# Patient Record
Sex: Female | Born: 1935 | Race: White | Hispanic: No | Smoking: Never smoker
Health system: Southern US, Community
[De-identification: ages and names within clinical notes are randomized; demographics above are authoritative.]

## PROBLEM LIST (undated history)

## (undated) DIAGNOSIS — E079 Disorder of thyroid, unspecified: Secondary | ICD-10-CM

## (undated) DIAGNOSIS — F0390 Unspecified dementia without behavioral disturbance: Secondary | ICD-10-CM

## (undated) DIAGNOSIS — I10 Essential (primary) hypertension: Secondary | ICD-10-CM

## (undated) DIAGNOSIS — H547 Unspecified visual loss: Secondary | ICD-10-CM

## (undated) DIAGNOSIS — H9190 Unspecified hearing loss, unspecified ear: Secondary | ICD-10-CM

## (undated) HISTORY — PX: ABDOMINAL HYSTERECTOMY: SHX81

## (undated) HISTORY — PX: KYPHOPLASTY: SHX5884

---

## 2007-09-21 ENCOUNTER — Ambulatory Visit (HOSPITAL_COMMUNITY): Admission: RE | Admit: 2007-09-21 | Discharge: 2007-09-21 | Payer: Self-pay | Admitting: General Surgery

## 2007-09-22 ENCOUNTER — Ambulatory Visit (HOSPITAL_COMMUNITY): Admission: RE | Admit: 2007-09-22 | Discharge: 2007-09-22 | Payer: Self-pay | Admitting: Interventional Radiology

## 2007-09-23 ENCOUNTER — Ambulatory Visit (HOSPITAL_COMMUNITY): Admission: RE | Admit: 2007-09-23 | Discharge: 2007-09-23 | Payer: Self-pay | Admitting: Interventional Radiology

## 2007-09-23 ENCOUNTER — Encounter (INDEPENDENT_AMBULATORY_CARE_PROVIDER_SITE_OTHER): Payer: Self-pay | Admitting: Interventional Radiology

## 2007-10-04 ENCOUNTER — Encounter: Payer: Self-pay | Admitting: Interventional Radiology

## 2010-01-17 ENCOUNTER — Ambulatory Visit: Payer: Self-pay | Admitting: Diagnostic Radiology

## 2010-01-17 ENCOUNTER — Inpatient Hospital Stay (HOSPITAL_COMMUNITY): Admission: EM | Admit: 2010-01-17 | Discharge: 2010-01-18 | Payer: Self-pay | Admitting: Internal Medicine

## 2010-01-17 ENCOUNTER — Encounter: Payer: Self-pay | Admitting: Emergency Medicine

## 2010-01-17 ENCOUNTER — Ambulatory Visit: Payer: Self-pay | Admitting: Internal Medicine

## 2010-01-18 ENCOUNTER — Encounter (INDEPENDENT_AMBULATORY_CARE_PROVIDER_SITE_OTHER): Payer: Self-pay | Admitting: Internal Medicine

## 2011-01-01 LAB — CARDIAC PANEL(CRET KIN+CKTOT+MB+TROPI)
CK, MB: 1.3 ng/mL (ref 0.3–4.0)
CK, MB: 1.5 ng/mL (ref 0.3–4.0)
CK, MB: 1.6 ng/mL (ref 0.3–4.0)
Relative Index: INVALID (ref 0.0–2.5)
Total CK: 44 U/L (ref 7–177)
Total CK: 51 U/L (ref 7–177)
Troponin I: <0.01 ng/mL (ref 0.00–0.06)

## 2011-01-01 LAB — CBC
HCT: 41.4 % (ref 36.0–46.0)
MCHC: 33.3 g/dL (ref 30.0–36.0)
MCV: 89.5 fL (ref 78.0–100.0)
Platelets: 226 10*3/uL (ref 150–400)
RDW: 14.6 % (ref 11.5–15.5)
WBC: 6.9 10*3/uL (ref 4.0–10.5)

## 2011-01-01 LAB — BASIC METABOLIC PANEL
CO2: 28 mEq/L (ref 19–32)
Calcium: 9.2 mg/dL (ref 8.4–10.5)
Chloride: 102 mEq/L (ref 96–112)
Creatinine, Ser: 0.67 mg/dL (ref 0.4–1.2)
GFR calc Af Amer: >60 mL/min (ref >60)
GFR calc non Af Amer: >60 mL/min (ref >60)
Glucose, Bld: 135 mg/dL — ABNORMAL HIGH (ref 70–99)
Potassium: 4.2 mEq/L (ref 3.5–5.1)
Sodium: 137 mEq/L (ref 135–145)

## 2011-01-01 LAB — COMPREHENSIVE METABOLIC PANEL
ALT: 6 U/L (ref 0–35)
Albumin: 4.2 g/dL (ref 3.5–5.2)
Alkaline Phosphatase: 81 U/L (ref 39–117)
BUN: 18 mg/dL (ref 6–23)
Calcium: 9.6 mg/dL (ref 8.4–10.5)
Chloride: 99 mEq/L (ref 96–112)
Creatinine, Ser: 0.6 mg/dL (ref 0.4–1.2)
GFR calc Af Amer: >60 mL/min (ref >60)
GFR calc non Af Amer: >60 mL/min (ref >60)
Glucose, Bld: 151 mg/dL — ABNORMAL HIGH (ref 70–99)

## 2011-01-01 LAB — DIFFERENTIAL
Basophils Relative: 1 % (ref 0–1)
Lymphs Abs: 0.6 10*3/uL — ABNORMAL LOW (ref 0.7–4.0)
Monocytes Absolute: 0.5 10*3/uL (ref 0.1–1.0)
Monocytes Relative: 7 % (ref 3–12)
Neutro Abs: 6 10*3/uL (ref 1.7–7.7)
Neutrophils Relative %: 84 % — ABNORMAL HIGH (ref 43–77)

## 2011-01-01 LAB — URINALYSIS, ROUTINE W REFLEX MICROSCOPIC
Glucose, UA: NEGATIVE mg/dL
Ketones, ur: NEGATIVE mg/dL
Specific Gravity, Urine: 1.013 (ref 1.005–1.030)
pH: 8 (ref 5.0–8.0)

## 2011-01-01 LAB — POCT CARDIAC MARKERS: Myoglobin, poc: 36.8 ng/mL (ref 12–200)

## 2011-01-01 LAB — LIPASE, BLOOD: Lipase: 128 U/L (ref 23–300)

## 2011-02-25 NOTE — Consult Note (Signed)
[name], [name]                  ACCOUNT NO.:  [id number]   MEDICAL RECORD NO.:  [id number]          PATIENT TYPE:  OUT   LOCATION:  XRAY                         FACILITY:  [facility]   PHYSICIAN:  Sanjeev K. Deveshwar, M.D.DATE OF BIRTH:  1918-11-03   DATE OF CONSULTATION:  09/21/2007  DATE OF DISCHARGE:                                 CONSULTATION   DATE OF CONSULT:  September 21, 2007.   CHIEF COMPLAINT:  L4 compression fracture.   HISTORY OF PRESENT ILLNESS:  This is an 77 year old female referred to  Dr. Corliss Skains through the courtesy of Dr. Jimmye Norman. The patient has a  long history of back pain. She reports that her pain became  progressively worse in September after she tried to lift a heavy table  and some chairs.  Apparently the patient was admitted to [facility] August 18, 2007 to August 22, 2007. She had a CT scan on  July 29, 2007 that showed an L4 compression fracture. A kyphoplasty  procedure was offered to the patient at that time, however, for some  reason she declined despite her severe pain.   The patient has had continued pain since that time despite narcotic pain  medications.  She arrived for the consult today accompanied by her  daughter.  The patient was in severe pain at the time of arrival. She  was given Vicodin here in the radiology department for her pain.   The patient's daughter is the Print production planner for [employer]  [employer] and Dr. Lindie Spruce, one of the surgeon's there, has referred the  patient to Dr. Corliss Skains for further evaluation and discussion regarding  treatment options.   PAST MEDICAL HISTORY:  Significant for hypertension, hypothyroidism.  The patient has chronic dizziness.  She reports some nose bleeds with  elevated blood pressures. She is very hard of hearing. As noted, she had  a CT scan recently that apparently showed a compression fracture at L4.  She also had a CT scan of the chest which showed a mild prominence of  the ascending thoracic aorta measuring 3 cm in its greatest AP diameter.  A CT scan of the abdomen apparently showed some renal cysts and  diverticulosis.   SURGICAL HISTORY:  Significant for bladder surgery as well as a  hysterectomy.  She denies previous problems with anesthesia.   ALLERGIES:  The patient has no true allergies.  She has developed nose  bleed secondary to aspirin.   CURRENT MEDICATIONS:  Dyazide, Synthroid, Senokot and hydrocodone.   SOCIAL HISTORY:  The patient is widowed.  She has two children.  She  lives alone in [place].  She has never used tobacco or alcohol to any  extent.  She was a homemaker.   FAMILY HISTORY:  Both parents died in their 82s from natural causes.   IMPRESSION AND PLAN:  As noted, the patient presents today for  evaluation of severe back pain felt secondary to an L4 compression  fracture which did require hospitalization at [facility].  The patient has had progression of her  pain to the point that  she is unable to care for herself or perform activities of daily living.  She presents today with her daughter who brings with her a CD that was  supposed to contain the CT scan performed at [facility].  However, the images on the disk were that of plain films of the lumbar  spine.  Apparently an MRI was attempted, however, the patient had severe  pain and anxiety associated with the MRI and this could not be  performed. Dr. Corliss Skains has recommended a bone scan for further  evaluation of the fracture.   The kyphoplasty and vertebroplasty procedures were described in great  detail along with potential risks and benefits as well as other  treatment options such as continued sedentary lifestyle and continued  narcotic pain medication.  The patient and her daughter would like to  have the patient proceed with the intervention for relief of pain and  stabilization of the fracture. As noted, Dr. Corliss Skains has  recommended a  bone scan prior to proceeding. The bone scan has been scheduled for  September 22, 2007. Based on these results, the patient will probably  undergo a kyphoplasty or vertebroplasty on September 23, 2007.  Greater  than 40 minutes was spent on this consult.      Delton See, P.A.    ______________________________  Grandville Silos. Corliss Skains, M.D.    DR/MEDQ  D:  09/22/2007  T:  09/22/2007  Job:  [id number]   cc:   Cherylynn Ridges, M.D.  Brandt Loosen, MD

## 2011-02-25 NOTE — Consult Note (Signed)
[name], [name]                  ACCOUNT NO.:  [id number]   MEDICAL RECORD NO.:  [id number]          PATIENT TYPE:  OUT   LOCATION:  XRAY                         FACILITY:  [facility]   PHYSICIAN:  Sanjeev K. Deveshwar, M.D.DATE OF BIRTH:  04-09-1919   DATE OF CONSULTATION:  10/04/2007  DATE OF DISCHARGE:                                 CONSULTATION   CHIEF COMPLAINT:  Status post T11 and L1 kyphoplasties performed  September 23, 2007.   BRIEF HISTORY:  This is a very pleasant 77 year old female who was  referred to Dr. Corliss Skains through the courtesy of Dr. Lindie Spruce for painful  compression fractures.  The patient was seen in consultation by Dr.  Corliss Skains on September 21, 2007.  At that time, she was accompanied by her  daughter.  The patient was evaluated and plans were made to proceed with  a bone scan for further evaluation of her fractures.  The patient's bone  scan was performed on September 22, 2007.  This suggested acute fractures  at T11 and L1.  There was no evidence of an acute fracture at L4.  A  thoracic CT scan of the spine was performed without contrast on September 23, 2007.  This showed compression fractures at T9, T10, T11 and L1.  On  September 23, 2007, the patient underwent a kyphoplasty procedure at the  T11 and L1 levels.  She returned on October 04, 2007 with her daughter  to be seen in follow-up.   PAST MEDICAL HISTORY:  1. Hypertension.  2. Hypothyroidism.  3. Chronic dizziness.  4. Occasional nosebleeds associated with hypertension.  5. She is very hard of hearing.  6. She was noted to have an ascending thoracic aorta measuring 3 cm in      its greatest AP diameter by a previous CT scan of the chest.   SURGICAL HISTORY:  1. Bladder surgery.  2. Hysterectomy.  (She denies any previous problems with anesthesia.)   ALLERGIES:  The patient has no true drug allergies.  She has developed  nosebleeds felt secondary to aspirin and hypertension.   MEDICATIONS:  1.  Dyazide.  2. Synthroid.  3. Senokot.  4. Hydrocodone.  5. Tylenol.   SOCIAL HISTORY:  This patient is widowed.  She has two children.  She  lives alone in [name].  She has been staying with her daughter.  She  has never used tobacco or alcohol to any extent.  She was a homemaker.   FAMILY HISTORY:  Both parents died in their 91s from natural causes.   IMPRESSION AND PLAN:  As noted, the patient returned today, October 04, 2007, to be seen in follow up after undergoing kyphoplasties at the L1  and T11 levels performed on September 23, 2007 by Dr. Corliss Skains.  The  patient is very hard of hearing and is somewhat of a poor historian.  Her daughter provides much of the history.  Apparently, the patient was  feeling much better after the kyphoplasty procedure.  She was  essentially pain free until last evening when she developed some further  back pain radiating down her leg.  The pain was located in the low back  in the paraspinal area.  She is pain free today when seen, but it is  difficult to say how long the pain lasted; however, the daughter  reported that the patient was quite uncomfortable at the time.   She continues to take Tylenol for pain.  She takes Vicodin occasionally  when the pain is severe, although Dr. Corliss Skains recommended that she try  to get by with just the Tylenol.  The patient's daughter also reports  that she has had decreased appetite and has lost approximately 7 pounds  since her fracture.   Dr. Corliss Skains recommended giving the patient some more time to heal.  As  noted, she had been pain free until last evening.  It is not clear what  this new pain was, although it appears to be gone now.  Dr. Corliss Skains  recommended further follow-up on a p.r.n. basis.  However, if the  patient was still having pain in approximately 1-2 weeks, he felt a  repeat MRI would be indicated in order to rule out a new fracture.  The  patient apparently did not tolerate an MRI  the last time due to her back  pain.  Therefore, she had a CT scan of the thoracic spine, as well as a  bone scan of the total body.  If further evaluation is needed, Dr.  Corliss Skains recommended a lumbar MRI.   Greater than 15 minutes was spent on this consult.      Delton See, P.A.    ______________________________  Grandville Silos. Corliss Skains, M.D.    DR/MEDQ  D:  10/05/2007  T:  10/05/2007  Job:  [id number]   cc:   Dr. Kathyrn Sheriff, M.D.

## 2011-07-21 LAB — CBC
MCHC: 33.4
MCV: 86.7
Platelets: 334
RBC: 5.14 — ABNORMAL HIGH

## 2011-07-21 LAB — BASIC METABOLIC PANEL
CO2: 28
Calcium: 10.4
Creatinine, Ser: 0.68
GFR calc non Af Amer: >60
Potassium: 4.1

## 2011-07-21 LAB — PROTIME-INR
INR: 0.9
Prothrombin Time: 12.8

## 2011-12-20 ENCOUNTER — Encounter (HOSPITAL_COMMUNITY): Payer: Self-pay

## 2011-12-20 ENCOUNTER — Emergency Department (HOSPITAL_COMMUNITY): Payer: Medicare Other

## 2011-12-20 ENCOUNTER — Observation Stay (HOSPITAL_COMMUNITY)
Admission: EM | Admit: 2011-12-20 | Discharge: 2011-12-25 | Disposition: A | Discharge status: Home / Self Care | Payer: Medicare Other | Attending: Internal Medicine | Admitting: Internal Medicine

## 2011-12-20 DIAGNOSIS — K5909 Other constipation: Secondary | ICD-10-CM | POA: Insufficient documentation

## 2011-12-20 DIAGNOSIS — M8448XA Pathological fracture, other site, initial encounter for fracture: Principal | ICD-10-CM | POA: Insufficient documentation

## 2011-12-20 DIAGNOSIS — Z9181 History of falling: Secondary | ICD-10-CM | POA: Insufficient documentation

## 2011-12-20 DIAGNOSIS — I10 Essential (primary) hypertension: Secondary | ICD-10-CM | POA: Insufficient documentation

## 2011-12-20 DIAGNOSIS — R109 Unspecified abdominal pain: Secondary | ICD-10-CM | POA: Insufficient documentation

## 2011-12-20 DIAGNOSIS — E119 Type 2 diabetes mellitus without complications: Secondary | ICD-10-CM | POA: Insufficient documentation

## 2011-12-20 DIAGNOSIS — Z87898 Personal history of other specified conditions: Secondary | ICD-10-CM | POA: Diagnosis present

## 2011-12-20 DIAGNOSIS — M810 Age-related osteoporosis without current pathological fracture: Secondary | ICD-10-CM | POA: Diagnosis present

## 2011-12-20 DIAGNOSIS — T398X5A Adverse effect of other nonopioid analgesics and antipyretics, not elsewhere classified, initial encounter: Secondary | ICD-10-CM | POA: Insufficient documentation

## 2011-12-20 DIAGNOSIS — S32000A Wedge compression fracture of unspecified lumbar vertebra, initial encounter for closed fracture: Secondary | ICD-10-CM | POA: Diagnosis present

## 2011-12-20 DIAGNOSIS — M549 Dorsalgia, unspecified: Secondary | ICD-10-CM | POA: Insufficient documentation

## 2011-12-20 DIAGNOSIS — M4850XA Collapsed vertebra, not elsewhere classified, site unspecified, initial encounter for fracture: Secondary | ICD-10-CM

## 2011-12-20 DIAGNOSIS — E039 Hypothyroidism, unspecified: Secondary | ICD-10-CM

## 2011-12-20 HISTORY — DX: Disorder of thyroid, unspecified: E07.9

## 2011-12-20 LAB — URINALYSIS, ROUTINE W REFLEX MICROSCOPIC
Bilirubin Urine: NEGATIVE
Ketones, ur: NEGATIVE mg/dL
Nitrite: NEGATIVE
pH: 5 (ref 5.0–8.0)

## 2011-12-20 LAB — CBC
HCT: 38.1 % (ref 36.0–46.0)
Hemoglobin: 12.9 g/dL (ref 12.0–15.0)
MCH: 29.5 pg (ref 26.0–34.0)
MCHC: 33.9 g/dL (ref 30.0–36.0)
MCV: 87.2 fL (ref 78.0–100.0)
Platelets: 192 10*3/uL (ref 150–400)
RBC: 4.37 MIL/uL (ref 3.87–5.11)

## 2011-12-20 LAB — DIFFERENTIAL
Basophils Absolute: 0 10*3/uL (ref 0.0–0.1)
Eosinophils Absolute: 0.1 10*3/uL (ref 0.0–0.7)
Lymphocytes Relative: 8 % — ABNORMAL LOW (ref 12–46)
Lymphs Abs: 0.6 10*3/uL — ABNORMAL LOW (ref 0.7–4.0)
Monocytes Absolute: 0.8 10*3/uL (ref 0.1–1.0)
Monocytes Relative: 10 % (ref 3–12)
Neutro Abs: 6.3 10*3/uL (ref 1.7–7.7)

## 2011-12-20 LAB — SAMPLE TO BLOOD BANK

## 2011-12-20 LAB — COMPREHENSIVE METABOLIC PANEL
AST: 16 U/L (ref 0–37)
BUN: 19 mg/dL (ref 6–23)
GFR calc Af Amer: 84 mL/min — ABNORMAL LOW (ref >90)
GFR calc non Af Amer: 72 mL/min — ABNORMAL LOW (ref >90)
Total Protein: 6.7 g/dL (ref 6.0–8.3)

## 2011-12-20 LAB — PROTIME-INR: INR: 1.06 (ref 0.00–1.49)

## 2011-12-20 MED ORDER — MORPHINE SULFATE 4 MG/ML IJ SOLN
4.0000 mg | Freq: Once | INTRAMUSCULAR | Status: AC
  Administered 2011-12-20: 4 mg via INTRAVENOUS
  Filled 2011-12-20: qty 1

## 2011-12-20 MED ORDER — SODIUM CHLORIDE 0.9 % IJ SOLN: 3.0000 mL | INTRAMUSCULAR | Status: DC | PRN

## 2011-12-20 MED ORDER — SODIUM CHLORIDE 0.9 % IV SOLN: 250.0000 mL | INTRAVENOUS | Status: DC | PRN

## 2011-12-20 MED ORDER — TRAMADOL HCL 50 MG PO TABS: ORAL_TABLET | ORAL | Status: DC

## 2011-12-20 MED ORDER — TRIAMTERENE-HCTZ 37.5-25 MG PO CAPS
1.0000 | ORAL_CAPSULE | Freq: Every day | ORAL | Status: DC
  Administered 2011-12-21 – 2011-12-24 (×4): 1 via ORAL
  Filled 2011-12-20 (×5): qty 1

## 2011-12-20 MED ORDER — SODIUM CHLORIDE 0.9 % IJ SOLN
3.0000 mL | Freq: Two times a day (BID) | INTRAMUSCULAR | Status: DC
  Administered 2011-12-23 – 2011-12-25 (×4): 3 mL via INTRAVENOUS

## 2011-12-20 MED ORDER — LEVOTHYROXINE SODIUM 50 MCG PO TABS
50.0000 ug | ORAL_TABLET | Freq: Every day | ORAL | Status: DC
  Administered 2011-12-21 – 2011-12-24 (×4): 50 ug via ORAL
  Filled 2011-12-20 (×6): qty 1

## 2011-12-20 MED ORDER — LORAZEPAM 0.5 MG PO TABS: 0.5000 mg | ORAL_TABLET | Freq: Three times a day (TID) | ORAL | Status: DC | PRN

## 2011-12-20 MED ORDER — SODIUM CHLORIDE 0.9 % IV SOLN
INTRAVENOUS | Status: DC
  Administered 2011-12-20: 75 mL/h via INTRAVENOUS

## 2011-12-20 MED ORDER — ACETAMINOPHEN 325 MG PO TABS
650.0000 mg | ORAL_TABLET | Freq: Four times a day (QID) | ORAL | Status: DC
  Administered 2011-12-20 – 2011-12-25 (×3): 650 mg via ORAL
  Filled 2011-12-20 (×3): qty 2

## 2011-12-20 MED ORDER — MORPHINE SULFATE 2 MG/ML IJ SOLN: 1.0000 mg | INTRAMUSCULAR | Status: DC | PRN

## 2011-12-20 MED ORDER — TRAMADOL HCL 50 MG PO TABS
100.0000 mg | ORAL_TABLET | Freq: Once | ORAL | Status: AC
  Administered 2011-12-20: 100 mg via ORAL
  Filled 2011-12-20: qty 2

## 2011-12-20 MED ORDER — TRAMADOL HCL 50 MG PO TABS
50.0000 mg | ORAL_TABLET | Freq: Four times a day (QID) | ORAL | Status: DC | PRN
Start: 2011-12-20 — End: 2011-12-25
  Administered 2011-12-20 – 2011-12-24 (×3): 50 mg via ORAL
  Filled 2011-12-20 (×3): qty 1

## 2011-12-20 MED ORDER — ONDANSETRON HCL 4 MG/2ML IJ SOLN
4.0000 mg | Freq: Once | INTRAMUSCULAR | Status: AC
  Administered 2011-12-20: 4 mg via INTRAVENOUS
  Filled 2011-12-20: qty 2

## 2011-12-20 MED ORDER — LOSARTAN POTASSIUM 50 MG PO TABS
50.0000 mg | ORAL_TABLET | Freq: Every day | ORAL | Status: DC
  Administered 2011-12-21 – 2011-12-24 (×4): 50 mg via ORAL
  Filled 2011-12-20 (×5): qty 1

## 2011-12-20 NOTE — ED Notes (Signed)
Sack lunch given

## 2011-12-20 NOTE — ED Provider Notes (Cosign Needed)
History     CSN: [id number]  Arrival date & time 12/20/11  0807   First MD Initiated Contact with Patient 12/20/11 0809      Chief Complaint  Patient presents with  . Back Pain  . Abdominal Pain    (Consider location/radiation/quality/duration/timing/severity/associated sxs/prior treatment) HPI  History obtained from patient and her daughter. She states sometime in December she was having muscle spasms in her back and her primary care doctor put her on Flexeril and Percocet. She was doing better however the pain returned and she put her on hydrocodone. She relates over the past 48 hours her pain has been getting more intense. She relates now she has diffuse abdominal pain and it shoots into her back and she feels like her abdomen is distended. She relates she was constipated however she did some laxatives in 2 days ago had good results. She states that having the bowel movements did not improve her discomfort. She denies nausea, vomiting, and and states she only has pain when she moves.  PCP Dr Luretha Rued  Past Medical History  Diagnosis Date  . Diabetes mellitus   . Thyroid disease     Past Surgical History  Procedure Date  . Cesarean section   . Abdominal hysterectomy     No family history on file.  History  Substance Use Topics  . Smoking status: Never Smoker   . Smokeless tobacco: Not on file  . Alcohol Use: No   lives at home alone Normally walks unassisted and sometimes uses a walker  OB History    Grav Para Term Preterm Abortions TAB SAB Ect Mult Living                  Review of Systems  All other systems reviewed and are negative.    Allergies  Aspirin  Home Medications   Current Outpatient Rx  Name Route Sig Dispense Refill  . LEVOTHYROXINE SODIUM 50 MCG PO TABS Oral Take 50 mcg by mouth daily.    Marland Kitchen LOSARTAN POTASSIUM 50 MG PO TABS Oral Take 50 mg by mouth daily.    . TRIAMTERENE-HCTZ 37.5-25 MG PO CAPS Oral Take 1 capsule by mouth daily.       BP 183/80  Pulse 86  Temp(Src) 97.9 F (36.6 C) (Oral)  Resp 18  SpO2 97%  Vital signs normal except hypertension   Physical Exam  Nursing note and vitals reviewed. Constitutional: She is oriented to person, place, and time.  Non-toxic appearance. She does not appear ill. No distress.       Patient's thin frail but alert and cooperative.   HENT:  Head: Normocephalic and atraumatic.  Right Ear: External ear normal.  Left Ear: External ear normal.  Nose: Nose normal. No mucosal edema or rhinorrhea.  Mouth/Throat: Oropharynx is clear and moist and mucous membranes are normal. No dental abscesses or uvula swelling.  Eyes: Conjunctivae and EOM are normal. Pupils are equal, round, and reactive to light.  Neck: Normal range of motion and full passive range of motion without pain. Neck supple.  Cardiovascular: Normal rate, regular rhythm and normal heart sounds.  Exam reveals no gallop and no friction rub.   No murmur heard. Pulmonary/Chest: Effort normal and breath sounds normal. No respiratory distress. She has no wheezes. She has no rhonchi. She has no rales. She exhibits no tenderness and no crepitus.  Abdominal: Soft. Normal appearance and bowel sounds are normal. She exhibits distension. She exhibits no mass. There is no  tenderness. There is no rebound and no guarding.  Musculoskeletal: Normal range of motion. She exhibits no edema and no tenderness.       Moves all extremities well. Patient has kyphosis. Her back is nontender to palpation.  Neurological: She is alert and oriented to person, place, and time. She has normal strength. No cranial nerve deficit.  Skin: Skin is warm, dry and intact. No rash noted. No erythema. No pallor.  Psychiatric: She has a normal mood and affect. Her speech is normal and behavior is normal. Her mood appears not anxious.    ED Course  Procedures (including critical care time)    Medications  0.9 %  sodium chloride infusion (75 mL/hr  Intravenous New Bag/Given 12/20/11 0858)  morphine 4 MG/ML injection 4 mg (4 mg Intravenous Given 12/20/11 0900)  ondansetron (ZOFRAN) injection 4 mg (4 mg Intravenous Given 12/20/11 0858)  traMADol (ULTRAM) tablet 100 mg (100 mg Oral Given 12/20/11 1333)     Have discussed tests results, her pain appears to be from new vertebral Fx's, her others were treated by Dr Link Snuffer in radiology by kyphoplasty.   Daughter is concerned about taking her home, states she doesn't like to take the norco or percocet because it makes her head feel funny. Will try tramadol and a back brace. Social worker has given daughter referrals to get in home care.  TLSO ordered for her back.   16:15 Pt had trouble getting to bedside commode, daughter wants her to be admitted.   Results for orders placed during the hospital encounter of 12/20/11  CBC      Component Value Range   WBC 7.7  4.0 - 10.5 (K/uL)   RBC 4.37  3.87 - 5.11 (MIL/uL)   Hemoglobin 12.9  12.0 - 15.0 (g/dL)   HCT 45.4  09.8 - 11.9 (%)   MCV 87.2  78.0 - 100.0 (fL)   MCH 29.5  26.0 - 34.0 (pg)   MCHC 33.9  30.0 - 36.0 (g/dL)   RDW 14.7  82.9 - 56.2 (%)   Platelets 192  150 - 400 (K/uL)  DIFFERENTIAL      Component Value Range   Neutrophils Relative 81 (*) 43 - 77 (%)   Neutro Abs 6.3  1.7 - 7.7 (K/uL)   Lymphocytes Relative 8 (*) 12 - 46 (%)   Lymphs Abs 0.6 (*) 0.7 - 4.0 (K/uL)   Monocytes Relative 10  3 - 12 (%)   Monocytes Absolute 0.8  0.1 - 1.0 (K/uL)   Eosinophils Relative 1  0 - 5 (%)   Eosinophils Absolute 0.1  0.0 - 0.7 (K/uL)   Basophils Relative 0  0 - 1 (%)   Basophils Absolute 0.0  0.0 - 0.1 (K/uL)  COMPREHENSIVE METABOLIC PANEL      Component Value Range   Sodium 139  135 - 145 (mEq/L)   Potassium 4.0  3.5 - 5.1 (mEq/L)   Chloride 101  96 - 112 (mEq/L)   CO2 28  19 - 32 (mEq/L)   Glucose, Bld 108 (*) 70 - 99 (mg/dL)   BUN 19  6 - 23 (mg/dL)   Creatinine, Ser 1.30  0.50 - 1.10 (mg/dL)   Calcium 86.5  8.4 - 10.5 (mg/dL)    Total Protein 6.7  6.0 - 8.3 (g/dL)   Albumin 3.9  3.5 - 5.2 (g/dL)   AST 16  0 - 37 (U/L)   ALT 12  0 - 35 (U/L)   Alkaline Phosphatase 85  39 - 117 (U/L)   Total Bilirubin 0.5  0.3 - 1.2 (mg/dL)   GFR calc non Af Amer 72 (*) >90 (mL/min)   GFR calc Af Amer 84 (*) >90 (mL/min)  APTT      Component Value Range   aPTT 31  24 - 37 (seconds)  PROTIME-INR      Component Value Range   Prothrombin Time 14.0  11.6 - 15.2 (seconds)   INR 1.06  0.00 - 1.49   SAMPLE TO BLOOD BANK      Component Value Range   Blood Bank Specimen SAMPLE AVAILABLE FOR TESTING     Sample Expiration 12/21/2011    URINALYSIS, ROUTINE W REFLEX MICROSCOPIC      Component Value Range   Color, Urine YELLOW  YELLOW    APPearance CLEAR  CLEAR    Specific Gravity, Urine 1.012  1.005 - 1.030    pH 5.0  5.0 - 8.0    Glucose, UA NEGATIVE  NEGATIVE (mg/dL)   Hgb urine dipstick MODERATE (*) NEGATIVE    Bilirubin Urine NEGATIVE  NEGATIVE    Ketones, ur NEGATIVE  NEGATIVE (mg/dL)   Protein, ur NEGATIVE  NEGATIVE (mg/dL)   Urobilinogen, UA 0.2  0.0 - 1.0 (mg/dL)   Nitrite NEGATIVE  NEGATIVE    Leukocytes, UA NEGATIVE  NEGATIVE   URINE MICROSCOPIC-ADD ON      Component Value Range   Squamous Epithelial / LPF RARE  RARE    RBC / HPF 0-2  <3 (RBC/hpf)   Laboratory interpretation all normal    Ct Abdomen Pelvis Wo Contrast  12/20/2011  *RADIOLOGY REPORT*  Clinical Data: Back pain and abdominal pain.  CT ABDOMEN AND PELVIS WITHOUT CONTRAST  Technique:  Multidetector CT imaging of the abdomen and pelvis was performed following the standard protocol without intravenous contrast.  Comparison: No priors.  Findings:  Lung Bases: There is a small hiatal hernia. The visualized lung parenchyma is unremarkable.  No definite pleural effusions.  Mild cardiomegaly.  Atherosclerotic calcifications within the left circumflex and right coronary arteries.  Abdomen/Pelvis:  Image #20 of series 2 demonstrates a 6 mm nonobstructive calculus  within the right renal collecting system. No additional abnormal urinary tract calcifications are noted within either collecting system or along the course of either ureter.  No stones identified within the lumen of the urinary bladder.  There are multiple phleboliths within the left gonadal vein (which at first glance appear potential for left ureteral calculi, however, they are clearly separate from the left ureter).  The unenhanced appearance of the liver, gallbladder, pancreas and spleen is unremarkable.  Mild bilateral adreniform thickening is noted (nonspecific).  There are numerous round well circumscribed low attenuation lesions noted in the kidneys bilaterally, the largest of which extends exophytically from the right lower pole measuring 6.9 cm in diameter.  None of these are adequately characterized on this noncontrast CT scan, but are statistically favored to represent cysts.  Numerous colonic diverticula, without surrounding inflammatory changes noted at this time to suggest acute diverticulitis.  No ascites or pneumoperitoneum and no pathologic distension of bowel. No definite pathologic adenopathy appreciated within the abdomen or pelvis.  There is extensive atherosclerosis of the abdominal pelvic vasculature, without frank aneurysm.  Status post hysterectomy.  A 2.1 cm low attenuation lesion in the right ovary.  Left ovary is unremarkable.  Urinary bladder is normal in appearance.  Incidental note is made of small bilateral inguinal hernias containing only fat.  Musculoskeletal: There are no  aggressive appearing lytic or blastic lesions noted in the visualized portions of the skeleton.  There are multiple compression fractures (T10, T11, L1, and L4), most severe at T10, T11 and L1 where there is approximately 50% loss of vertebral body height.  Post procedural changes of vertebroplasty are also noted at T11 and L1.  Mild retrolisthesis of L2 upon L3 is noted (likely chronic).  Mild (grade 1)  anterolisthesis of L4 upon L5 is also noted.  IMPRESSION: 1.  6 mm nonobstructive calculus in the right renal collecting system.  No ureteral calculi or signs of the urinary tract obstruction on today's examination. 2.  Colonic diverticulosis without findings to suggest acute diverticulitis. 3.  Multiple low-attenuation renal lesions bilaterally, largest of which measures up to 6.9 cm in diameter extending exophytically off the lower pole of the right kidney.  None of these are technically characterized on this noncontrast CT examination, however, these are statistically favored to represent cysts.  These can be further evaluated with non emergent renal ultrasound if clinically indicated. 4.  Extensive atherosclerosis including left circumflex and right coronary artery disease. Assessment for potential risk factor modification, dietary therapy or pharmacologic therapy may be warranted, if clinically indicated. 5.  2.1 cm low attenuation lesion in the right adnexa likely represents a small ovarian cyst.  This is unusual in an elderly patient of this age, and could be further evaluated with transvaginal ultrasound if clinically indicated. 6.  Status post hysterectomy. 7.  Small hiatal hernia. 8.  Multiple vertebral compression fractures with a post procedural changes of vertebroplasty in the thoracic and lumbar spine, as detailed above.  Original Report Authenticated By: Florencia Reasons, M.D.     1. Back pain   2. Vertebral compression fracture   3. Abdominal pain    New Prescriptions   TRAMADOL (ULTRAM) 50 MG TABLET    Take 1 or 2 po Q6hrs prn pain     Plan discharge with referral to Dr Warrick Parisian and Dr Danielle Dess  Devoria Albe, MD, FACEP   MDM  Pt admitted, daughter didn't feel she could go home. Dr Anitra Lauth will talk to admitting doctor         Ward Givens, MD 12/20/11 [phone]

## 2011-12-20 NOTE — ED Notes (Signed)
In and out not needed.   Urine sample provided by patient.

## 2011-12-20 NOTE — Progress Notes (Signed)
Weekend MSW Note:   MSW received call from RN re: in home assistance. Per chart review, pt resides alone and presented to [facility] ED via daughter Margarita Mail @ [phone] for generalized pain with no relief from narcotics. Pt is with PCP who manages pts pain and needs.  Pt does not meet in pt criteria therefore she will be discharged home.  MSW met with pt who presented A/O x4 yet HOH. Per daughter, pt resides about 2 min. from her home and has resided alone for many years.  Pts daughter request resources to assist pt at home as she is employed full time and can not provide all the needed assistance. Pts daughter stated she may be able to call upon extended family/close friends yet she is not sure to what degree that can assist.  MSW provided psycho-education on custodial care vs. HHC vs ALF vs LTC . CSW provided several community resources to assist with the noted concerns as well as additional information for anticipatory needs based on brief assessment.   Pts daughter confirmed she will be able to provide  care to pt until she is able to ascertain prvt duty care or assistance from family/friends. Pt daughter's reports to feel comfortable calling the noted agencies to arrange services. All questions and answers re: resources have been addressed. No further MSW interventions identified.  Dionne Milo MSW [facility]. Weekend/Social Worker [phone]

## 2011-12-20 NOTE — H&P (Signed)
History and Physical Examination  Date: 12/20/2011  Patient name: [name] Medical record number: [id number] Date of birth: 1918-11-04 Age: 78 y.o. Gender: female  PCP: Luretha Rued  Attending physician: Cleora Fleet, MD  Chief Complaint:  Chief Complaint  Patient presents with  . Back Pain  . Abdominal Pain     History of Present Illness: [name] is an 78 y.o. female with a long history of lumbar compression fractures s/p kyphoplasty done in 2008 who presented to the ER today with her daughter: History obtained from patient and her daughter. She states sometime in December she was having muscle spasms in her back and her primary care doctor put her on Flexeril and Percocet. She was doing better however the pain returned and she put her on hydrocodone. She relates over the past 48 hours her pain has been getting more intense. She relates now she has diffuse abdominal pain and it shoots into her back and she feels like her abdomen is distended. She relates she was constipated however she did some laxatives in 2 days ago had good results. She states that having the bowel movements did not improve her discomfort. She denies nausea, vomiting, and and states she only has pain when she moves.  She had a CT of abdomen that was negative.  It did show new lumbar compression fractures thought to be related to her severe pain.  She has severe pain with certain movements.  She has pain with ambulation and because she lives alone, family is really concerned that it is not safe for her to return home at this time.  She says that she cannot cook for herself.  She has difficulty turning in the bed and has difficulty with ambulation around the house.  She is hard of hearing.  She is a very high risk for fall at this time.     Past Medical History Past Medical History  Diagnosis Date  . Diabetes mellitus   . Thyroid disease     Past Surgical History Past Surgical History  Procedure Date    . Cesarean section   . Abdominal hysterectomy     Home Meds: Prior to Admission medications   Medication Sig Start Date End Date Taking? Authorizing Provider  levothyroxine (SYNTHROID, LEVOTHROID) 50 MCG tablet Take 50 mcg by mouth daily.   Yes Historical Provider, MD  losartan (COZAAR) 50 MG tablet Take 50 mg by mouth daily.   Yes Historical Provider, MD  triamterene-hydrochlorothiazide (DYAZIDE) 37.5-25 MG per capsule Take 1 capsule by mouth daily.   Yes Historical Provider, MD  traMADol (ULTRAM) 50 MG tablet Take 1 or 2 po Q6hrs prn pain 12/20/11   Ward Givens, MD    Allergies: Aspirin  Social History:  History   Social History  . Marital Status: Widowed    Spouse Name: N/A    Number of Children: N/A  . Years of Education: N/A   Occupational History  . Not on file.   Social History Main Topics  . Smoking status: Never Smoker   . Smokeless tobacco: Not on file  . Alcohol Use: No  . Drug Use: No  . Sexually Active:    Other Topics Concern  . Not on file   Social History Narrative  . No narrative on file   Family History: No family history on file.  Review of Systems: Pertinent items are noted in HPI. All other systems reviewed and reported as negative.   Physical Exam:  Blood pressure 109/61, pulse 78, temperature 97.4 F (36.3 C), temperature source Oral, resp. rate 18, SpO2 93.00%. General - well developed female, in no distress, sitting straight up in bed, alert, oriented, calm HEENT - mucous membranes dry Lungs - BBS clear  CV - normal s1, s2 sounds Abd - soft, nondistended, no masses palpated, normal BS Back - stiffness noted, and tenderness in lumbar spinous processes with certain movements to stand Ext - no cyanosis, no pretibial edema Neuro - no focal findings    Lab  And Imaging results:  Results for orders placed during the hospital encounter of 12/20/11 (from the past 24 hour(s))  SAMPLE TO BLOOD BANK     Status: Normal   Collection Time    12/20/11  8:35 AM      Component Value Range   Blood Bank Specimen SAMPLE AVAILABLE FOR TESTING     Sample Expiration 12/21/2011    CBC     Status: Normal   Collection Time   12/20/11  8:55 AM      Component Value Range   WBC 7.7  4.0 - 10.5 (K/uL)   RBC 4.37  3.87 - 5.11 (MIL/uL)   Hemoglobin 12.9  12.0 - 15.0 (g/dL)   HCT 40.9  81.1 - 91.4 (%)   MCV 87.2  78.0 - 100.0 (fL)   MCH 29.5  26.0 - 34.0 (pg)   MCHC 33.9  30.0 - 36.0 (g/dL)   RDW 78.2  95.6 - 21.3 (%)   Platelets 192  150 - 400 (K/uL)  DIFFERENTIAL     Status: Abnormal   Collection Time   12/20/11  8:55 AM      Component Value Range   Neutrophils Relative 81 (*) 43 - 77 (%)   Neutro Abs 6.3  1.7 - 7.7 (K/uL)   Lymphocytes Relative 8 (*) 12 - 46 (%)   Lymphs Abs 0.6 (*) 0.7 - 4.0 (K/uL)   Monocytes Relative 10  3 - 12 (%)   Monocytes Absolute 0.8  0.1 - 1.0 (K/uL)   Eosinophils Relative 1  0 - 5 (%)   Eosinophils Absolute 0.1  0.0 - 0.7 (K/uL)   Basophils Relative 0  0 - 1 (%)   Basophils Absolute 0.0  0.0 - 0.1 (K/uL)  COMPREHENSIVE METABOLIC PANEL     Status: Abnormal   Collection Time   12/20/11  8:55 AM      Component Value Range   Sodium 139  135 - 145 (mEq/L)   Potassium 4.0  3.5 - 5.1 (mEq/L)   Chloride 101  96 - 112 (mEq/L)   CO2 28  19 - 32 (mEq/L)   Glucose, Bld 108 (*) 70 - 99 (mg/dL)   BUN 19  6 - 23 (mg/dL)   Creatinine, Ser 0.86  0.50 - 1.10 (mg/dL)   Calcium 57.8  8.4 - 10.5 (mg/dL)   Total Protein 6.7  6.0 - 8.3 (g/dL)   Albumin 3.9  3.5 - 5.2 (g/dL)   AST 16  0 - 37 (U/L)   ALT 12  0 - 35 (U/L)   Alkaline Phosphatase 85  39 - 117 (U/L)   Total Bilirubin 0.5  0.3 - 1.2 (mg/dL)   GFR calc non Af Amer 72 (*) >90 (mL/min)   GFR calc Af Amer 84 (*) >90 (mL/min)  APTT     Status: Normal   Collection Time   12/20/11  8:55 AM      Component Value Range   aPTT  31  24 - 37 (seconds)  PROTIME-INR     Status: Normal   Collection Time   12/20/11  8:55 AM      Component Value Range   Prothrombin Time 14.0   11.6 - 15.2 (seconds)   INR 1.06  0.00 - 1.49   URINALYSIS, ROUTINE W REFLEX MICROSCOPIC     Status: Abnormal   Collection Time   12/20/11 12:13 PM      Component Value Range   Color, Urine YELLOW  YELLOW    APPearance CLEAR  CLEAR    Specific Gravity, Urine 1.012  1.005 - 1.030    pH 5.0  5.0 - 8.0    Glucose, UA NEGATIVE  NEGATIVE (mg/dL)   Hgb urine dipstick MODERATE (*) NEGATIVE    Bilirubin Urine NEGATIVE  NEGATIVE    Ketones, ur NEGATIVE  NEGATIVE (mg/dL)   Protein, ur NEGATIVE  NEGATIVE (mg/dL)   Urobilinogen, UA 0.2  0.0 - 1.0 (mg/dL)   Nitrite NEGATIVE  NEGATIVE    Leukocytes, UA NEGATIVE  NEGATIVE   URINE MICROSCOPIC-ADD ON     Status: Normal   Collection Time   12/20/11 12:13 PM      Component Value Range   Squamous Epithelial / LPF RARE  RARE    RBC / HPF 0-2  <3 (RBC/hpf)   EKG Results:  No orders found for this or any previous visit.   Impression  Lumbar compression fracture  Acute back pain  Hypothyroidism  Osteoporosis  Hypertension  History of dizziness  High risk for fall   Plan  Admit to medical bed, pain management, physical therapy, resume home meds for blood pressure and thyroid.  Pt clearly doesn't want to stay in the hospital but because of the safety issue of her going home now alone with significant back pain, for her safety she is being admitted.  I had a long discussion with patient's daughter [facility]) and she is convinced that pt will fall down if she goes home at this time.  Please see orders.    Standley Dakins MD [facility] [place], [place] [phone] 12/20/2011, 5:46 PM

## 2011-12-20 NOTE — ED Notes (Signed)
Pt has had ongoing pain since December.  Was seen by PCP and given Rx for percocet and flexeril with some relief.  Pt then given Rx for hydrocodone with no relief.

## 2011-12-20 NOTE — ED Notes (Signed)
Pt removed TLSO brace and refuses to wear.

## 2011-12-20 NOTE — ED Notes (Signed)
Pt requiring maximum assistance x 2 to maneuver in TLSO brace to use bedside commode.

## 2011-12-20 NOTE — Progress Notes (Signed)
Orthopedic Tech Progress Note Patient Details:  [name] 02-01-1919 [id number]  Splint Location: Back brace Splint Interventions: Other (comment)  Contacted [employer] for Brace order.  Breandan People T 12/20/2011, 2:08 PM

## 2011-12-20 NOTE — ED Notes (Signed)
Urine sample collected if needed. 

## 2011-12-20 NOTE — ED Notes (Signed)
Pt refusing to be admitted.  Several staff and family speaking with pt re: need to stay for safety and pain mgmt.

## 2011-12-21 MED ORDER — OXYCODONE-ACETAMINOPHEN 5-325 MG PO TABS
1.0000 | ORAL_TABLET | ORAL | Status: DC | PRN
  Administered 2011-12-21 – 2011-12-25 (×9): 1 via ORAL
  Filled 2011-12-21 (×9): qty 1

## 2011-12-21 MED ORDER — MORPHINE SULFATE 2 MG/ML IJ SOLN: 1.0000 mg | INTRAMUSCULAR | Status: DC | PRN

## 2011-12-21 MED ORDER — LACTULOSE 10 GM/15ML PO SOLN
20.0000 g | Freq: Two times a day (BID) | ORAL | Status: AC
  Administered 2011-12-21 – 2011-12-22 (×3): 20 g via ORAL
  Filled 2011-12-21 (×3): qty 30

## 2011-12-21 MED ORDER — MORPHINE SULFATE 2 MG/ML IJ SOLN
1.0000 mg | INTRAMUSCULAR | Status: AC
  Administered 2011-12-21: 1 mg via INTRAMUSCULAR
  Filled 2011-12-21: qty 1

## 2011-12-21 NOTE — Progress Notes (Signed)
Physical Therapy Evaluation Patient Details Name: [name] MRN: [id number] DOB: [birth year] Today's Date: 12/21/2011  Problem List:  Patient Active Problem List  Diagnoses  . Hypothyroidism  . Lumbar compression fracture  . Acute back pain  . Osteoporosis  . Hypertension  . History of dizziness    Past Medical History:  Past Medical History  Diagnosis Date  . Diabetes mellitus   . Thyroid disease    Past Surgical History:  Past Surgical History  Procedure Date  . Cesarean section   . Abdominal hysterectomy     PT Assessment/Plan/Recommendation PT Assessment Clinical Impression Statement: Pt presents with a medical diagnosis of back pain with multiple vertebral fractures as well as gait deficits decreasing pts safety. Pt is extremely hard of hearing and can understand simple sentences lip reading or writing. Pt benefits from demonstration. Pt will benefit from skilled PT in the acute care setting in order to maximize functional mobility for a safe d/c.  PT Recommendation/Assessment: Patient will need skilled PT in the acute care venue PT Problem List: Decreased balance;Decreased activity tolerance;Decreased mobility;Decreased knowledge of use of DME;Decreased safety awareness;Decreased knowledge of precautions;Pain Barriers to Discharge: Decreased caregiver support Barriers to Discharge Comments: family only available by phone 24/7 PT Therapy Diagnosis : Abnormality of gait;Acute pain PT Plan PT Frequency: Min 3X/week PT Treatment/Interventions: DME instruction;Gait training;Functional mobility training;Therapeutic activities;Therapeutic exercise;Balance training;Patient/family education PT Recommendation Follow Up Recommendations: Home health PT;Supervision/Assistance - 24 hour;Skilled nursing facility [facility] 24/7 supervision vs SNF) Equipment Recommended: None recommended by PT PT Goals  Acute Rehab PT Goals PT Goal Formulation: With patient/family Time For Goal  Achievement: 2 weeks Pt will go Supine/Side to Sit: with modified independence PT Goal: Supine/Side to Sit - Progress: Goal set today Pt will go Sit to Supine/Side: with modified independence PT Goal: Sit to Supine/Side - Progress: Goal set today Pt will go Sit to Stand: with supervision PT Goal: Sit to Stand - Progress: Goal set today Pt will go Stand to Sit: with supervision PT Goal: Stand to Sit - Progress: Goal set today Pt will Transfer Bed to Chair/Chair to Bed: with supervision PT Transfer Goal: Bed to Chair/Chair to Bed - Progress: Goal set today Pt will Ambulate: >150 feet;with modified independence;with least restrictive assistive device PT Goal: Ambulate - Progress: Goal set today  PT Evaluation Precautions/Restrictions  Precautions Precautions: Fall Restrictions Weight Bearing Restrictions: No Prior Functioning  Home Living Lives With: Alone Receives Help From: Family (checked on regularly; not someone at home) Type of Home: House Home Layout: One level Home Access: Stairs to enter Entrance Stairs-Rails: Left Entrance Stairs-Number of Steps: 3 Bathroom Shower/Tub: Forensic scientist: Handicapped height Bathroom Accessibility: Yes How Accessible: Accessible via walker Home Adaptive Equipment: Raised toilet seat with rails;Walker - rolling Prior Function Level of Independence: Independent with basic ADLs;Independent with gait;Independent with transfers;Needs assistance with homemaking Homemaking Assistance Comments: family helps with homemaking and groceries Able to Take Stairs?: Yes Driving: No Vocation: Retired Financial risk analyst Arousal/Alertness: Awake/alert Overall Cognitive Status: Appears within functional limits for tasks assessed Orientation Level: Oriented X4 Sensation/Coordination Sensation Light Touch: Appears Intact Extremity Assessment RLE Assessment RLE Assessment: Within Functional Limits LLE Assessment LLE Assessment:  Within Functional Limits Mobility (including Balance) Bed Mobility Bed Mobility: Yes Rolling Right: 4: Min assist Rolling Right Details (indicate cue type and reason): Tactile cues for forward movement of shoulders and hand placement Right Sidelying to Sit: 3: Mod assist;HOB elevated (comment degrees) Right Sidelying to Sit Details (indicate cue type  and reason): Assist and tactile cues through pelvis into sitting. VC for sequencing (Pt did not achieve full seated position, see below) Sitting - Scoot to Edge of Bed: 6: Modified independent (Device/Increase time) Transfers Transfers: Yes Sit to Stand: 3: Mod assist;With upper extremity assist;From bed Sit to Stand Details (indicate cue type and reason): Pt completed motion from 1/2 sidelying into stand secondary to pain. VC for sequencing and hand placement. Pt pulled self up from sidelying onto walker. Mod assist for stability and safety throughout transfer Stand to Sit: 3: Mod assist;With upper extremity assist;To chair/3-in-1 Stand to Sit Details: verbal and tactile cueing for hand placement into sitting. Assist to control descent. Ambulation/Gait Ambulation/Gait: Yes Ambulation/Gait Assistance: 3: Mod assist Ambulation/Gait Assistance Details (indicate cue type and reason): Mod assist for stability as pt ambulates with decreased knee extension as well as extremely narrow base of support. At times, pt ambulates in tandem. VC throughout for safety with distance to RW as well as proper ambulation technique for safety. Ambulation Distance (Feet): 100 Feet Assistive device: Rolling walker Gait Pattern: Step-to pattern;Decreased stride length;Right flexed knee in stance;Left flexed knee in stance;Shuffle;Trunk flexed;Antalgic;Decreased trunk rotation Gait velocity: Gait slowed down by PT for safety    Exercise    End of Session PT - End of Session Equipment Utilized During Treatment: Gait belt (pt refused back brace) Activity Tolerance:  Patient tolerated treatment well Patient left: in chair;with call bell in reach Nurse Communication: Mobility status for transfers;Mobility status for ambulation General Behavior During Session: [facility] for tasks performed Cognition: [facility] for tasks performed  Milana Kidney 12/21/2011, 1:31 PM  12/21/2011 Milana Kidney DPT PAGER: [phone] OFFICE: [phone]

## 2011-12-21 NOTE — Progress Notes (Signed)
[facility] Inpatient Progress Note  12/21/2011  Subjective: Pt had some bad pain this morning but was able to get up and move for PT.  She wants to go home but will need 24/7 assistance plus home health PT when discharged. She has a bloated abdomen and has had constipation since being on the pain medications and I'd like to give her some assistance with that.    Objective:  Vital signs in last 24 hours: Filed Vitals:   12/20/11 1850 12/20/11 2147 12/21/11 0318 12/21/11 0611  BP: 144/80 125/66 102/62 125/62  Pulse: 78 75 68 70  Temp: 97.8 F (36.6 C) 97.6 F (36.4 C) 97.6 F (36.4 C) 97.6 F (36.4 C)  TempSrc: Oral Oral Oral Oral  Resp: 18 18 18 18   Height:   5' (1.524 m)   Weight:   53.071 kg (117 lb)   SpO2: 91% 92% 95% 95%   Weight change:  No intake or output data in the 24 hours ending 12/21/11 1415  Review of Systems Back pain, abdominal bloating, constipation, hard of hearing, partial blindness, otherwise all reviewed and negative.   Physical Exam Oral, resp. rate 18, SpO2 93.00%.  General - well developed female, in no distress, sitting straight up in bed, alert, oriented, calm  HEENT - mucous membranes dry  Lungs - BBS clear  CV - normal s1, s2 sounds  Abd - soft, mild distension,  no masses palpated, normal BS  Back - stiffness noted, and tenderness in lumbar spinous processes with certain movements to stand  Ext - no cyanosis, no pretibial edema  Neuro - no focal findings   Lab Results: No results found for this or any previous visit (from the past 24 hour(s)).  Micro Results: No results found for this or any previous visit (from the past 240 hour(s)).  Studies/Results: Ct Abdomen Pelvis Wo Contrast  12/20/2011  *RADIOLOGY REPORT*  Clinical Data: Back pain and abdominal pain.  CT ABDOMEN AND PELVIS WITHOUT CONTRAST  Technique:  Multidetector CT imaging of the abdomen and pelvis was performed following the standard protocol without intravenous  contrast.  Comparison: No priors.  Findings:  Lung Bases: There is a small hiatal hernia. The visualized lung parenchyma is unremarkable.  No definite pleural effusions.  Mild cardiomegaly.  Atherosclerotic calcifications within the left circumflex and right coronary arteries.  Abdomen/Pelvis:  Image #20 of series 2 demonstrates a 6 mm nonobstructive calculus within the right renal collecting system. No additional abnormal urinary tract calcifications are noted within either collecting system or along the course of either ureter.  No stones identified within the lumen of the urinary bladder.  There are multiple phleboliths within the left gonadal vein (which at first glance appear potential for left ureteral calculi, however, they are clearly separate from the left ureter).  The unenhanced appearance of the liver, gallbladder, pancreas and spleen is unremarkable.  Mild bilateral adreniform thickening is noted (nonspecific).  There are numerous round well circumscribed low attenuation lesions noted in the kidneys bilaterally, the largest of which extends exophytically from the right lower pole measuring 6.9 cm in diameter.  None of these are adequately characterized on this noncontrast CT scan, but are statistically favored to represent cysts.  Numerous colonic diverticula, without surrounding inflammatory changes noted at this time to suggest acute diverticulitis.  No ascites or pneumoperitoneum and no pathologic distension of bowel. No definite pathologic adenopathy appreciated within the abdomen or pelvis.  There is extensive atherosclerosis of the abdominal pelvic vasculature, without  frank aneurysm.  Status post hysterectomy.  A 2.1 cm low attenuation lesion in the right ovary.  Left ovary is unremarkable.  Urinary bladder is normal in appearance.  Incidental note is made of small bilateral inguinal hernias containing only fat.  Musculoskeletal: There are no aggressive appearing lytic or blastic lesions noted in  the visualized portions of the skeleton.  There are multiple compression fractures (T10, T11, L1, and L4), most severe at T10, T11 and L1 where there is approximately 50% loss of vertebral body height.  Post procedural changes of vertebroplasty are also noted at T11 and L1.  Mild retrolisthesis of L2 upon L3 is noted (likely chronic).  Mild (grade 1) anterolisthesis of L4 upon L5 is also noted.  IMPRESSION: 1.  6 mm nonobstructive calculus in the right renal collecting system.  No ureteral calculi or signs of the urinary tract obstruction on today's examination. 2.  Colonic diverticulosis without findings to suggest acute diverticulitis. 3.  Multiple low-attenuation renal lesions bilaterally, largest of which measures up to 6.9 cm in diameter extending exophytically off the lower pole of the right kidney.  None of these are technically characterized on this noncontrast CT examination, however, these are statistically favored to represent cysts.  These can be further evaluated with non emergent renal ultrasound if clinically indicated. 4.  Extensive atherosclerosis including left circumflex and right coronary artery disease. Assessment for potential risk factor modification, dietary therapy or pharmacologic therapy may be warranted, if clinically indicated. 5.  2.1 cm low attenuation lesion in the right adnexa likely represents a small ovarian cyst.  This is unusual in an elderly patient of this age, and could be further evaluated with transvaginal ultrasound if clinically indicated. 6.  Status post hysterectomy. 7.  Small hiatal hernia. 8.  Multiple vertebral compression fractures with a post procedural changes of vertebroplasty in the thoracic and lumbar spine, as detailed above.  Original Report Authenticated By: Florencia Reasons, M.D.    Medications:  Scheduled Meds:   . acetaminophen  650 mg Oral Q6H  . levothyroxine  50 mcg Oral QAC breakfast  . losartan  50 mg Oral Daily  .  morphine injection  1 mg  Intramuscular NOW  . sodium chloride  3 mL Intravenous Q12H  . triamterene-hydrochlorothiazide  1 each Oral Daily   Continuous Infusions:   . DISCONTD: sodium chloride 75 mL/hr (12/20/11 0858)   PRN Meds:.sodium chloride, LORazepam, morphine, oxyCODONE-acetaminophen, sodium chloride, traMADol, DISCONTD: morphine  Assessment/Plan: Impression  Lumbar compression fracture  Acute back pain  Constipation from narcotic medications Hypothyroidism  Osteoporosis  Hypertension  History of dizziness  High risk for fall   Plan  Pt may be discharged later today or tomorrow if family can make arrangements for 24/7 sitter to be with patient at home to assist her with ADLs.  Pt will be given rx for lactulose p.o. For her constipation, continue pain meds as needed for back pain,  Morphine has helped with her acute pain.  Please see orders.    Standley Dakins MD  [facility]  [facility]  [place], [place]  [phone]  12/20/2011, 5:46 PM    LOS: 1 day   Deran Barro 12/21/2011, 2:15 PM   Cleora Fleet, MD, CDE, FAAFP [facility] [place], [place]  [phone]

## 2011-12-22 NOTE — Progress Notes (Signed)
[facility] Inpatient Progress Note  12/22/2011  Subjective: Pt having a lot of pain when lying in bed, she does better sitting up. She is working with PT.  Her daughter has a Comptroller for her at home for nights and working on days.   Pt was seen by IR and will need a bone scan so radiologist can make a treatment determination.  She could possibly have the procedure later this week. Pt had a bm today.   Objective:  Vital signs in last 24 hours: Filed Vitals:   12/21/11 1507 12/21/11 2018 12/22/11 0155 12/22/11 0550  BP: 147/68 142/74 138/76 134/78  Pulse: 73 64 62 64  Temp: 97.7 F (36.5 C) 98 F (36.7 C) 98.6 F (37 C) 98.6 F (37 C)  TempSrc: Oral Oral Oral Oral  Resp: 19 19 19 19   Height:      Weight:      SpO2: 95% 96% 96% 95%   Weight change:  No intake or output data in the 24 hours ending 12/22/11 1614 No results found for this basename: HGBA1C   Lab Results  Component Value Date   CREATININE 0.74 12/20/2011    Review of Systems As above, otherwise all reviewed and reported negative  Physical Exam General - well developed female, in no distress, sitting straight up in bed, alert, oriented, calm  HEENT - mucous membranes moist Lungs - BBS clear  CV - normal s1, s2 sounds  Abd - soft, mild distension, no masses palpated, normal BS  Back - stiffness noted, and tenderness in lumbar spinous processes with certain movements to stand  Ext - no cyanosis, no pretibial edema  Neuro - no focal findings   Lab Results: No results found for this or any previous visit (from the past 24 hour(s)).  Micro Results: No results found for this or any previous visit (from the past 240 hour(s)).  Medications:  Scheduled Meds:   . acetaminophen  650 mg Oral Q6H  . lactulose  20 g Oral BID  . levothyroxine  50 mcg Oral QAC breakfast  . losartan  50 mg Oral Daily  . sodium chloride  3 mL Intravenous Q12H  . triamterene-hydrochlorothiazide  1 each Oral Daily   Continuous  Infusions:  PRN Meds:.sodium chloride, LORazepam, morphine, oxyCODONE-acetaminophen, sodium chloride, traMADol  Impression  Lumbar compression fracture  Acute back pain  Constipation from narcotic medications  Hypothyroidism  Osteoporosis  Hypertension  History of dizziness  High risk for fall   Plan  Pt is scheduled to have a bone scan and IR can then determine therapeutic options.  Pt may have kyphoplasty later this week.  Continue physical therapy.  Continue bowel management with lactulose.  Continue pain management.     LOS: 2 days   Emiline Mancebo 12/22/2011, 4:14 PM   Cleora Fleet, MD, CDE, FAAFP [facility] [place], [place]  [phone]

## 2011-12-22 NOTE — Progress Notes (Signed)
Physical Therapy Treatment Patient Details Name: [name] MRN: [id number] DOB: 1919-09-04 Today's Date: 12/22/2011  PT Assessment/Plan  PT - Assessment/Plan Comments on Treatment Session: Patient progressing well with mobility - requires assistance for side to sit and this needs to be the emphasis of her treatment session at next visit. PT Plan: Discharge plan remains appropriate;Frequency remains appropriate Follow Up Recommendations: Supervision/Assistance - 24 hour (Daughter requests we educate her future caregivers on any PT needs as secondary to patient being hard of hearing she would not like people coming to the house on a regular basis as she feels uncomfortable with it). Equipment Recommended: None recommended by PT PT Goals  Acute Rehab PT Goals PT Goal: Supine/Side to Sit - Progress: Progressing toward goal PT Goal: Sit to Supine/Side - Progress: Met PT Goal: Sit to Stand - Progress: Met PT Goal: Stand to Sit - Progress: Met PT Goal: Ambulate - Progress: Progressing toward goal  PT Treatment Precautions/Restrictions  Precautions Precautions: Back;Fall Restrictions Weight Bearing Restrictions: No Mobility (including Balance) Bed Mobility Rolling Right: 4: Min assist Rolling Right Details (indicate cue type and reason): Min-guard assistance- difficulty rotating completely to side. Right Sidelying to Sit: 4: Min assist;HOB flat Right Sidelying to Sit Details (indicate cue type and reason): Unable to lift trunk past mid range - tried witout rail and patient feels she can not perform without rail use. Sitting - Scoot to Edge of Bed: 7: Independent Sit to Sidelying Right: 6: Modified independent (Device/Increase time) Transfers Sit to Stand: From bed;With upper extremity assist;5: Supervision Sit to Stand Details (indicate cue type and reason): Verbal cues for hand placement to and from device Stand to Sit: With upper extremity assist;To bed;6: Modified independent  (Device/Increase time) Stand to Sit Details: Good control of descent with correct hand placement Ambulation/Gait Ambulation/Gait Assistance: 5: Supervision Ambulation/Gait Assistance Details (indicate cue type and reason): Pre-morbid flexion - stays safe distance from walker - good efficiency Ambulation Distance (Feet): 250 Feet Assistive device: Rolling walker Gait Pattern: Trunk flexed (Pre-morbid) Gait velocity: 2.3 feet/second indicative of ability to access community. Stairs: Yes Stairs Assistance: 5: Supervision Stair Management Technique: Two rails;Alternating pattern;Forwards Number of Stairs: 5     End of Session PT - End of Session Equipment Utilized During Treatment: Gait belt Activity Tolerance: Patient tolerated treatment well Patient left: in bed;with call bell in reach;with family/visitor present General Behavior During Session: [facility] for tasks performed Cognition: [facility] for tasks performed  Edwyna Perfect, PT  Pager [phone] 12/22/2011, 1:42 PM

## 2011-12-22 NOTE — Evaluation (Signed)
Occupational Therapy Evaluation Patient Details Name: [name] MRN: [id number] DOB: 02-13-1919 Today's Date: 12/22/2011  Problem List:  Patient Active Problem List  Diagnoses  . Hypothyroidism  . Lumbar compression fracture  . Acute back pain  . Osteoporosis  . Hypertension  . History of dizziness    Past Medical History:  Past Medical History  Diagnosis Date  . Diabetes mellitus   . Thyroid disease    Past Surgical History:  Past Surgical History  Procedure Date  . Cesarean section   . Abdominal hysterectomy     OT Assessment/Plan/Recommendation OT Assessment Clinical Impression Statement: This 78 y.o. female admitted for back pain due to new lumbar compression fracture.  Pt. demonstrates the below listed deficits that affect her ability to perform ADLs independently.   Recommend OT to maximize safety and independence with BADLs to allow her to return home at supervision level.  Daughter reports she is in process of hiring 24 hour caregivers  OT Recommendation/Assessment: Patient will need skilled OT in the acute care venue OT Problem List: Decreased strength;Impaired balance (sitting and/or standing);Decreased knowledge of use of DME or AE;Decreased safety awareness;Decreased knowledge of precautions;Pain Barriers to Discharge: None OT Therapy Diagnosis : Generalized weakness;Acute pain OT Plan OT Frequency: Min 2X/week OT Treatment/Interventions: Self-care/ADL training;DME and/or AE instruction;Therapeutic activities;Patient/family education;Balance training OT Recommendation Follow Up Recommendations: Home health OT;Supervision/Assistance - 24 hour Equipment Recommended: None recommended by PT Individuals Consulted Consulted and Agree with Results and Recommendations: Patient OT Goals Acute Rehab OT Goals OT Goal Formulation: With patient Time For Goal Achievement: 2 weeks ADL Goals Pt Will Perform Grooming: with supervision;Standing at sink ADL Goal: Grooming  - Progress: Goal set today Pt Will Perform Lower Body Bathing: with supervision;Sit to stand from chair;Sit to stand from bed ADL Goal: Lower Body Bathing - Progress: Goal set today Pt Will Perform Lower Body Dressing: with supervision;Sit to stand from chair;Sit to stand from bed ADL Goal: Lower Body Dressing - Progress: Goal set today Pt Will Transfer to Toilet: with supervision;Ambulation;Comfort height toilet ADL Goal: Toilet Transfer - Progress: Goal set today Pt Will Perform Toileting - Clothing Manipulation: with supervision;Standing ADL Goal: Toileting - Clothing Manipulation - Progress: Goal set today Additional ADL Goal #1: Pt. will adhere to back precautions with min verbal cues ADL Goal: Additional Goal #1 - Progress: Goal set today  OT Evaluation Precautions/Restrictions  Precautions Precautions: Back;Fall Precaution Comments: Pt. instructed in back precautions - requires mod verbal cues and min A to maintain Required Braces or Orthoses:  (Pt. with TLSO in room; no order found.  Pt. refuses to wear) Restrictions Weight Bearing Restrictions: No Prior Functioning Home Living Lives With: Alone Receives Help From: Family Type of Home: House Home Layout: One level Home Access: Stairs to enter Entrance Stairs-Rails: Left Entrance Stairs-Number of Steps: 3 Bathroom Shower/Tub: Forensic scientist: Handicapped height Bathroom Accessibility: Yes How Accessible: Accessible via walker Home Adaptive Equipment: Raised toilet seat with rails;Walker - rolling Prior Function Level of Independence: Independent with basic ADLs;Independent with gait;Independent with transfers;Needs assistance with homemaking Homemaking Assistance Comments: family helps with homemaking and groceries Able to Take Stairs?: Yes Driving: No Vocation: Retired Comments: Pt. reports she sponge bathes at home ADL ADL Eating/Feeding: Simulated;Independent Where Assessed -  Eating/Feeding: Chair Grooming: Performed;Wash/dry hands;Teeth care (min guard assist and mod verbal cues for precautions) Where Assessed - Grooming: Standing at sink Upper Body Bathing: Simulated;Set up Where Assessed - Upper Body Bathing: Sitting, bed;Sitting, chair Lower Body Bathing: Simulated (  min guard assist) Lower Body Bathing Details (indicate cue type and reason): Able to cross ankles over knees Where Assessed - Lower Body Bathing: Sit to stand from chair;Sit to stand from bed Upper Body Dressing: Simulated;Set up Where Assessed - Upper Body Dressing: Unsupported;Sitting, bed;Sitting, chair Lower Body Dressing: Simulated;Performed (socks; min guard assist for sit to stand) Where Assessed - Lower Body Dressing: Sit to stand from chair;Sit to stand from bed Toilet Transfer: Performed (min guard assist) Toilet Transfer Method: Ambulating Toilet Transfer Equipment: Comfort height toilet;Grab bars Toileting - Clothing Manipulation: Performed (min guard assist) Where Assessed - Toileting Clothing Manipulation: Standing Toileting - Hygiene: Performed;Modified independent Where Assessed - Toileting Hygiene: Sit on 3-in-1 or toilet Equipment Used: Rolling walker Ambulation Related to ADLs: Pt. ambulated with min guard assist and mod cues for back precautions.   ADL Comments: Pt. requires mod verbal cues and occasionally min A for back precautions due to pt's severe HOH - she does not always hear the verbal cues.  Pt. tends to bend and twist.  Pt. has TLSO in room, and refuses to wear it.  Daughter present at end of session, and reports that pt. likely will not modifiy behaviors, abide by precautions, or participate in [facility] therapies. Vision/Perception  Vision - History Patient Visual Report: No change from baseline Cognition Cognition Arousal/Alertness: Awake/alert Overall Cognitive Status: Appears within functional limits for tasks assessed Orientation Level: Oriented X4 Cognition -  Other Comments: Pt. very HOH Sensation/Coordination Sensation Light Touch: Appears Intact Coordination Gross Motor Movements are Fluid and Coordinated: Yes Fine Motor Movements are Fluid and Coordinated: Yes Extremity Assessment RUE Assessment RUE Assessment: Within Functional Limits LUE Assessment LUE Assessment: Within Functional Limits Mobility  Bed Mobility Bed Mobility: Yes Right Sidelying to Sit: 5: Supervision;With rails;HOB flat Sitting - Scoot to Edge of Bed: 7: Independent Sit to Sidelying Right: 6: Modified independent (Device/Increase time);With rail;HOB flat Transfers Transfers: Yes Sit to Stand: From bed;With upper extremity assist;5: Supervision Stand to Sit: With upper extremity assist;To bed;6: Modified independent (Device/Increase time) Exercises   End of Session OT - End of Session Activity Tolerance: Patient tolerated treatment well Patient left: in bed;with call bell in reach General Behavior During Session: [facility] for tasks performed Cognition: [facility] for tasks performed   Chevonne Bostrom M 12/22/2011, 5:38 PM

## 2011-12-22 NOTE — Progress Notes (Signed)
Utilization review complete 

## 2011-12-22 NOTE — Progress Notes (Signed)
CT imaging reviewed.  Patient with significant pain affecting multiple facets of ADLs.  Need to have bone scan to differentiate chronic versus acute fracture given number of fractures.  Insurance has already been notified of potential procedure.  Once bone scan performed - Dr. Corliss Skains to review for possible VP/KP later in week.

## 2011-12-23 ENCOUNTER — Observation Stay (HOSPITAL_COMMUNITY): Payer: Medicare Other

## 2011-12-23 ENCOUNTER — Inpatient Hospital Stay (HOSPITAL_COMMUNITY): Payer: Medicare Other

## 2011-12-23 LAB — GLUCOSE, CAPILLARY: Glucose-Capillary: 151 mg/dL — ABNORMAL HIGH (ref 70–99)

## 2011-12-23 MED ORDER — TECHNETIUM TC 99M MEDRONATE IV KIT
25.0000 | PACK | Freq: Once | INTRAVENOUS | Status: AC | PRN
  Administered 2011-12-23: 25 via INTRAVENOUS

## 2011-12-23 NOTE — Progress Notes (Signed)
Utilization review complete 

## 2011-12-23 NOTE — Progress Notes (Signed)
   CARE MANAGEMENT NOTE 12/23/2011  Patient:  [name], [name]   Account Number:  [id number]  Date Initiated:  12/23/2011  Documentation initiated by:  Onnie Boer  Subjective/Objective Assessment:   PT WAS ADMITTED WITH BACK PAIN     Action/Plan:   PROGRESSION OF CARE AND DISCHARGE PLANNING   Anticipated DC Date:  12/23/2011   Anticipated DC Plan:  HOME W HOME HEALTH SERVICES      DC Planning Services  CM consult      Choice offered to / List presented to:             Status of service:  In process, will continue to follow Medicare Important Message given?   (If response is "NO", the following Medicare IM given date fields will be blank) Date Medicare IM given:   Date Additional Medicare IM given:    Discharge Disposition:    Per UR Regulation:  Reviewed for med. necessity/level of care/duration of stay  If discussed at Long Length of Stay Meetings, dates discussed:    Comments:  12/22/11 Onnie Boer, RN, BSN 1122 PT WANTS TO GO HOME WITH CARE FROM HER DAUGHTER AFTER COMPLETION OF A BONE SCAN TODAY.  ID HAD BEEN CONSULTED TO DETERMINE NEED OF KYPHOPLASTY.  WILL F/U.  PT NOT MEETING INPATIENT AND CC44 IS GOING TO BE ISSUED TODAY.

## 2011-12-23 NOTE — Progress Notes (Signed)
   CARE MANAGEMENT NOTE 12/23/2011  Patient:  [name], [name]   Account Number:  [id number]  Date Initiated:  12/23/2011  Documentation initiated by:  Onnie Boer  Subjective/Objective Assessment:   PT WAS ADMITTED WITH BACK PAIN     Action/Plan:   PROGRESSION OF CARE AND DISCHARGE PLANNING   Anticipated DC Date:  12/23/2011   Anticipated DC Plan:  HOME W HOME HEALTH SERVICES      DC Planning Services  CM consult      Choice offered to / List presented to:             Status of service:  Completed, signed off Medicare Important Message given?   (If response is "NO", the following Medicare IM given date fields will be blank) Date Medicare IM given:   Date Additional Medicare IM given:    Discharge Disposition:  HOME/SELF CARE  Per UR Regulation:  Reviewed for med. necessity/level of care/duration of stay  If discussed at Long Length of Stay Meetings, dates discussed:    Comments:  12/23/2011 1200 Spoke to daughter, Margarita Mail # [phone]. Explained Condition 44 and pt recommendation for obs on 3/9 and 3/10 and d/c scheduled for today if she is stable. Daughter signed paperwork and NCM placed on shadow chart. States no HH or DME is needed for home. She is going to get bed rails for pt. Isidoro Donning RN CCM Case Mgmt phone [phone]  12/22/11 Onnie Boer, RN, BSN 1122 PT WANTS TO GO HOME WITH CARE FROM HER DAUGHTER AFTER COMPLETION OF A BONE SCAN TODAY.  ID HAD BEEN CONSULTED TO DETERMINE NEED OF KYPHOPLASTY.  WILL F/U.  PT NOT MEETING INPATIENT AND CC44 IS GOING TO BE ISSUED TODAY.

## 2011-12-23 NOTE — Progress Notes (Signed)
Physical Therapy Treatment Patient Details Name: [name] MRN: [id number] DOB: 11/26/1918 Today's Date: 12/23/2011  PT Assessment/Plan  PT - Assessment/Plan Comments on Treatment Session: Patient will have the necessary care at home to return there at discharge. We will continue to follow acutely for one further visit to practice bed mobility without rail - since this is predominately pain limited this may improve if patient is a candidate for kyphoplasty. We will also attempt to improve recall and incr. safety of sit to stand with walker and ambulation in busy environments. PT Plan: Discharge plan needs to be updated;Frequency remains appropriate Follow Up Recommendations: No PT follow up;Supervision/Assistance - 24 hour Equipment Recommended: None recommended by PT PT Goals  Acute Rehab PT Goals PT Goal: Supine/Side to Sit - Progress: Progressing toward goal PT Goal: Sit to Supine/Side - Progress: Met PT Goal: Sit to Stand - Progress: Met PT Goal: Stand to Sit - Progress: Met PT Transfer Goal: Bed to Chair/Chair to Bed - Progress: Met PT Goal: Ambulate - Progress: Progressing toward goal  PT Treatment Precautions/Restrictions  Precautions Precautions: Back;Fall Precaution Comments: Re-educated in posture and body mechanics Required Braces or Orthoses:  (Pt. with TLSO in room; no order found.  Pt. refuses to wear) Restrictions Weight Bearing Restrictions: No Mobility (including Balance) Bed Mobility Rolling Right: 6: Modified independent (Device/Increase time) Rolling Right Details (indicate cue type and reason): with rail use. Without rail use needs min guard assist for cueing to log roll and not leave upper body retracted. Rolling Left: 6: Modified independent (Device/Increase time);With rail Right Sidelying to Sit: 6: Modified independent (Device/Increase time);With rails Right Sidelying to Sit Details (indicate cue type and reason): Patient able to transition well with rail.  Discussed with daughter and supplied information on how to purchase an assist bed rail. Patient able to complete transition today with practice without rail but has increased pain and requires increased time Left Sidelying to Sit: 6: Modified independent (Device/Increase time);With rails Sitting - Scoot to Edge of Bed: 7: Independent Sit to Sidelying Right: 6: Modified independent (Device/Increase time) Transfers Sit to Stand: 5: Supervision;With upper extremity assist;From toilet;From bed Sit to Stand Details (indicate cue type and reason): Continues to require verbal cues/tactile cues for hand placement as wants to pull up on walker Stand to Sit: 6: Modified independent (Device/Increase time);To toilet;To bed Ambulation/Gait Ambulation/Gait Assistance: 5: Supervision Ambulation/Gait Assistance Details (indicate cue type and reason): Patient is modified indep. in closed spaces. However, patient with increased difficulty with open environment (busy) and dual task conditions - has to stop if wants to talk or turn her head. She also needs supervision with negotiating tight spaces.  Ambulation Distance (Feet): 400 Feet Assistive device: Rolling walker Gait Pattern: Trunk flexed (Pre-morbid)  High Level Balance High Level Balance Activites: Sudden stops;Turns;Head turns;Direction changes End of Session PT - End of Session Equipment Utilized During Treatment: Gait belt Activity Tolerance: Patient tolerated treatment well Patient left: in bed;with call bell in reach;with family/visitor present Nurse Communication: Mobility status for ambulation General Behavior During Session: [name] for tasks performed Cognition: [facility] for tasks performed  Edwyna Perfect, PT  Pager [phone] 12/23/2011, 9:25 AM

## 2011-12-23 NOTE — Progress Notes (Signed)
[facility] Inpatient Progress Note  12/23/2011  Subjective: Pt says that she is able to tolerate pain with the pain medications we provide. She is having trouble lying recumbent.   She reported a BM.   Objective:  Vital signs in last 24 hours: Filed Vitals:   12/22/11 0550 12/22/11 1400 12/22/11 2200 12/23/11 0600  BP: 134/78 134/65 131/80 172/82  Pulse: 64 95 70 72  Temp: 98.6 F (37 C) 98.1 F (36.7 C) 98.3 F (36.8 C) 98.5 F (36.9 C)  TempSrc: Oral Oral Oral Oral  Resp: 19 18 19 18   Height:      Weight:      SpO2: 95% 95% 96% 98%   Weight change:  No intake or output data in the 24 hours ending 12/23/11 1258 No results found for this basename: HGBA1C   Lab Results  Component Value Date   CREATININE 0.74 12/20/2011    Review of Systems As above, otherwise all reviewed and reported negative  Physical Exam General - well developed female, in no distress, sitting straight up in bed, alert, oriented, calm  HEENT - mucous membranes moist  Lungs - BBS clear  CV - normal s1, s2 sounds  Abd - soft, mild distension, no masses palpated, normal BS  Back - stiffness noted, and tenderness in lumbar spinous processes with certain movements to stand  Ext - no cyanosis, no pretibial edema  Neuro - no focal findings    Lab Results: Results for orders placed during the hospital encounter of 12/20/11 (from the past 24 hour(s))  GLUCOSE, CAPILLARY     Status: Abnormal   Collection Time   12/22/11  9:50 PM      Component Value Range   Glucose-Capillary 151 (*) 70 - 99 (mg/dL)   Comment 1 Documented in Chart     Comment 2 Notify RN      Micro Results: No results found for this or any previous visit (from the past 240 hour(s)).  Medications:  Scheduled Meds:   . acetaminophen  650 mg Oral Q6H  . levothyroxine  50 mcg Oral QAC breakfast  . losartan  50 mg Oral Daily  . sodium chloride  3 mL Intravenous Q12H  . triamterene-hydrochlorothiazide  1 each Oral Daily    Continuous Infusions:  PRN Meds:.sodium chloride, LORazepam, morphine, oxyCODONE-acetaminophen, sodium chloride, traMADol  Impression  Lumbar compression fracture  Acute back pain  Constipation from narcotic medications  Hypothyroidism  Osteoporosis  Hypertension  History of dizziness  High risk for fall  Hard of hearing / Deafness  Plan  Pt is scheduled to have a bone scan today and IR can then determine if she is to proceed with a second kyphoplasty procedure.  Pain currently controlled.  Continue physical therapy. Continue bowel management with lactulose. Continue pain management. Updated pt's daughter and patient.     LOS: 3 days   Taevin Mcferran 12/23/2011, 12:58 PM   Cleora Fleet, MD, CDE, FAAFP [facility] [place], [place]  [phone]

## 2011-12-23 NOTE — Progress Notes (Signed)
.  Clinical social worker completed patient psychosocial assessment, please see assessment in patient shadow chart.  Pt and Pt dtr plan for pt to return home with 24 hour supervision to be provided by pt family. Pt daughter will discuss with rn case manager regarding home health needs. .No further Clinical Social Work needs, signing off.   Doree Albee  [phone] 12/23/2011 8:30am

## 2011-12-24 MED ORDER — CALCITONIN (SALMON) 200 UNIT/ACT NA SOLN
1.0000 | Freq: Every day | NASAL | Status: DC
  Administered 2011-12-24 – 2011-12-25 (×2): 1 via NASAL
  Filled 2011-12-24: qty 3.7

## 2011-12-24 MED ORDER — CEFAZOLIN SODIUM 1-5 GM-% IV SOLN
1.0000 g | Freq: Once | INTRAVENOUS | Status: DC
  Filled 2011-12-24: qty 50

## 2011-12-24 MED ORDER — VITAMIN D3 25 MCG (1000 UNIT) PO TABS
2000.0000 [IU] | ORAL_TABLET | Freq: Every day | ORAL | Status: DC
  Administered 2011-12-24 – 2011-12-25 (×2): 2000 [IU] via ORAL
  Filled 2011-12-24 (×2): qty 2

## 2011-12-24 MED ORDER — MAGNESIUM HYDROXIDE 400 MG/5ML PO SUSP
15.0000 mL | Freq: Once | ORAL | Status: AC
  Administered 2011-12-24: 15 mL via ORAL
  Filled 2011-12-24: qty 30

## 2011-12-24 MED ORDER — LORAZEPAM 0.5 MG PO TABS
0.5000 mg | ORAL_TABLET | Freq: Three times a day (TID) | ORAL | Status: DC | PRN
  Administered 2011-12-25: 0.5 mg via ORAL
  Filled 2011-12-24: qty 1

## 2011-12-24 NOTE — Progress Notes (Signed)
Utilization review complete 

## 2011-12-24 NOTE — H&P (Signed)
[name] is an 78 y.o. female.   Chief Complaint: painful compression fracture and lower abdominal pain. HPI: History of pain since fall 09/2011 with worsening pain and complications with narcotics and constipation and confusion per daughter.    Past Medical History  Diagnosis Date  . Diabetes mellitus   . Thyroid disease     Past Surgical History  Procedure Date  . Cesarean section   . Abdominal hysterectomy     No family history on file. Social History:  reports that she has never smoked. She does not have any smokeless tobacco history on file. She reports that she does not drink alcohol or use illicit drugs.  Allergies:  Allergies  Allergen Reactions  . Aspirin Other (See Comments)    Nose bleeds.     Medications Prior to Admission  Medication Dose Route Frequency Provider Last Rate Last Dose  . 0.9 %  sodium chloride infusion  250 mL Intravenous PRN Clanford Cyndie Mull, MD      . acetaminophen (TYLENOL) tablet 650 mg  650 mg Oral Q6H Clanford L Johnson, MD   650 mg at 12/21/11 0700  . calcitonin (salmon) (MIACALCIN/FORTICAL) nasal spray 1 spray  1 spray Alternating Nares Daily Edsel Petrin, DO   1 spray at 12/24/11 1400  . cholecalciferol (VITAMIN D) tablet 2,000 Units  2,000 Units Oral Daily Edsel Petrin, DO   2,000 Units at 12/24/11 1400  . lactulose (CHRONULAC) 10 GM/15ML solution 20 g  20 g Oral BID Clanford Cyndie Mull, MD   20 g at 12/22/11 1009  . levothyroxine (SYNTHROID, LEVOTHROID) tablet 50 mcg  50 mcg Oral QAC breakfast Clanford Cyndie Mull, MD   50 mcg at 12/24/11 0715  . LORazepam (ATIVAN) tablet 0.5 mg  0.5 mg Oral Q8H PRN Clanford L Johnson, MD      . losartan (COZAAR) tablet 50 mg  50 mg Oral Daily Clanford Cyndie Mull, MD   50 mg at 12/24/11 1018  . morphine 2 MG/ML injection 1 mg  1 mg Intramuscular NOW Clanford Cyndie Mull, MD   1 mg at 12/21/11 0945  . morphine 2 MG/ML injection 1 mg  1 mg Intramuscular Q4H PRN Clanford L Johnson, MD      .  morphine 4 MG/ML injection 4 mg  4 mg Intravenous Once Ward Givens, MD   4 mg at 12/20/11 0900  . ondansetron (ZOFRAN) injection 4 mg  4 mg Intravenous Once Ward Givens, MD   4 mg at 12/20/11 0858  . oxyCODONE-acetaminophen (PERCOCET) 5-325 MG per tablet 1 tablet  1 tablet Oral Q4H PRN Clanford Cyndie Mull, MD   1 tablet at 12/24/11 0342  . sodium chloride 0.9 % injection 3 mL  3 mL Intravenous Q12H Clanford L Johnson, MD   3 mL at 12/24/11 1019  . sodium chloride 0.9 % injection 3 mL  3 mL Intravenous PRN Clanford Cyndie Mull, MD      . technetium medronate (TC-MDP) injection 25 milli Curie  25 milli Curie Intravenous Once PRN Medication Radiologist, MD   25 milli Curie at 12/23/11 1000  . traMADol (ULTRAM) tablet 100 mg  100 mg Oral Once Ward Givens, MD   100 mg at 12/20/11 1333  . traMADol (ULTRAM) tablet 50 mg  50 mg Oral Q6H PRN Clanford Cyndie Mull, MD   50 mg at 12/24/11 1559  . triamterene-hydrochlorothiazide (DYAZIDE) 37.5-25 MG per capsule 1 each  1 each Oral Daily Clanford L Johnson,  MD   1 each at 12/24/11 1018  . DISCONTD: 0.9 %  sodium chloride infusion   Intravenous Continuous Ward Givens, MD 75 mL/hr at 12/20/11 0858 75 mL/hr at 12/20/11 0858  . DISCONTD: morphine 2 MG/ML injection 1 mg  1 mg Intravenous Q4H PRN Clanford Cyndie Mull, MD       No current outpatient prescriptions on file as of 12/24/2011.    Results for orders placed during the hospital encounter of 12/20/11 (from the past 48 hour(s))  GLUCOSE, CAPILLARY     Status: Abnormal   Collection Time   12/22/11  9:50 PM      Component Value Range Comment   Glucose-Capillary 151 (*) 70 - 99 (mg/dL)    Comment 1 Documented in Chart      Comment 2 Notify RN      Nm Bone Scan Whole Body  12/23/2011  *RADIOLOGY REPORT*  Clinical Data: Evaluate acuteness of compression fractures. Multiple sites seen on CT scan.  The patient injured the spine in 2008.  NUCLEAR MEDICINE WHOLE BODY BONE SCINTIGRAPHY  Technique:  Whole body anterior and  posterior images were obtained approximately 3 hours after intravenous injection of radiopharmaceutical.  Radiopharmaceutical: CURIE TC-MDP TECHNETIUM TC 34M MEDRONATE IV KIT  Comparison: Nuclear medicine bone scan 09/22/2007.  CT abdomen pelvis 12/20/2011,  Findings: There is an intense focal area of uptake in a mid to lower thoracic spine vertebral body. The exact numbering of the vertebral body levels is difficult on nuclear medicine bone scan (favored to be T9).  There are no focal areas of uptake in the lumbar spine to suggest that the compression fractures seen on recent CT are acute. Therefore, the compression fractures seen in the abdomen pelvis CT recently are non-acute/remote.  There are degenerative changes of the elbows and wrists, and knees bilaterally.  IMPRESSION: 1.  Intense uptake in a single mid to lower thoracic spine vertebral body.  This is suspicious for an acute compression fracture.  Consider correlation with thoracic spine radiographs. 2.  There are no focal areas of uptake in the lumbar spine. 3.  Scattered areas of degenerative change.  Original Report Authenticated By: Britta Mccreedy, M.D.    Review of Systems  Constitutional: Negative for fever and chills.  Cardiovascular: Negative for chest pain, palpitations and leg swelling.  Gastrointestinal: Positive for abdominal pain and constipation.  Musculoskeletal: Positive for back pain and falls.    Blood pressure 154/80, pulse 74, temperature 97.9 F (36.6 C), temperature source Oral, resp. rate 18, height 5' (1.524 m), weight 117 lb (53.071 kg), SpO2 94.00%. Physical Exam  Constitutional: She is oriented to person, place, and time. She appears well-developed and well-nourished.  HENT:  Head: Normocephalic and atraumatic.       Very hard of hearing  Neck: Normal range of motion.  Cardiovascular: Normal rate, regular rhythm and normal heart sounds.   Respiratory: Effort normal and breath sounds normal.  GI: She  exhibits distension. There is no guarding.       Hypoactive bowel sounds   Musculoskeletal: Normal range of motion. She exhibits no edema and no tenderness.  Neurological: She is alert and oriented to person, place, and time. Coordination normal.  Psychiatric: She has a normal mood and affect. Her behavior is normal. Thought content normal.     Assessment/Plan Discussion with patient and daughter regarding findings on nuc med bone scan, acute T9 fracture with pain with palpation and possible radicular pain to abdomen on top of  constipation. They are aware of VP procedure, has done well in past with same.  Written consent obtained from daughter.  Plan to proceed in am with T9 VP.    Pharrell Ledford D 12/24/2011, 4:15 PM

## 2011-12-24 NOTE — Progress Notes (Addendum)
Assessment and Plan: I have reviewed the patient's records in detail and discussed her diagnosis, treatment options and disposition options in detail with patients daughter Artist Pais.   1. T9 Acute compression fracture: -Re-orded Kyphoplasty, given immobility need to get this done if possible before she leaves the hospital, will have Dr. Titus Dubin review ASAP -Started Calcitonin and Vitamin D, hx of falls  -prn oxycodone for pain -continue to mobilize with PT  2. Disposition: D/C home after kyphoplasty tomorrow This patient clearly needs 24/7 supervision, her daughter has arranged for sitters, patient refuses help -wants to live independently or alone-agreeable to help temporarily. I discussed problems with this arrangement with daughter. Patient resistant to options. 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 Will need PT in the Home.   Subjective Patient sleeping soundly. No pain or signs of discomfort.  Filed Vitals:   12/24/11 1400  BP: 156/84  Pulse: 101  Temp: 97.7 F (36.5 C)  Resp: 20   Exam deferred, resting comfortably. Per nursing mild agitation prior to my visit. HOH, baseline vision problems.  Anderson Malta, DO Internal Medicine

## 2011-12-24 NOTE — Progress Notes (Signed)
Occupational Therapy Treatment Patient Details Name: [name] MRN: [id number] DOB: 1918/11/03 Today's Date: 12/24/2011  OT Assessment/Plan OT Assessment/Plan Comments on Treatment Session: Pt pleasant and progressing well with therapy. Pt declines to wear TLSO stating it causing a pinching pain in the middle of her back and the front pushes against her chin. Pt asked if cutting the brace could help. Pt states "no use in wasting time. I rather hurt back here (pointing to spine) then here (pointing from chin to waist line). Pt laughing throughout session and eager to go home soon. "i was hoping they let me leave today" OT Plan: Discharge plan remains appropriate OT Frequency: Min 2X/week Follow Up Recommendations: Home health OT;Supervision/Assistance - 24 hour Equipment Recommended: None recommended by OT OT Goals Acute Rehab OT Goals OT Goal Formulation: With patient Time For Goal Achievement: 2 weeks ADL Goals Pt Will Perform Grooming: with supervision;Standing at sink ADL Goal: Grooming - Progress: Met Pt Will Perform Lower Body Bathing: with supervision;Sit to stand from chair;Sit to stand from bed ADL Goal: Lower Body Bathing - Progress: Not met Pt Will Perform Lower Body Dressing: with supervision;Sit to stand from chair;Sit to stand from bed ADL Goal: Lower Body Dressing - Progress: Not met Pt Will Transfer to Toilet: with supervision;Ambulation;Comfort height toilet ADL Goal: Toilet Transfer - Progress: Met Pt Will Perform Toileting - Clothing Manipulation: with supervision;Standing ADL Goal: Toileting - Clothing Manipulation - Progress: Met Additional ADL Goal #1: Pt. will adhere to back precautions with min verbal cues ADL Goal: Additional Goal #1 - Progress: Progressing toward goals  OT Treatment Precautions/Restrictions  Precautions Precautions: Back;Fall Precaution Comments: re-educated on back precautions. Pt able to repeat precautions back to therapist to ensure  that pt heard therapist. Note pad in room and message hand written for pt to read Required Braces or Orthoses: Yes Spinal Brace: Thoracolumbosacral orthotic (pt declines to wear brace stating "it hurts and its to high") Restrictions Weight Bearing Restrictions: No   ADL ADL Grooming: Performed;Supervision/safety;Wash/dry Programmer, applications Details (indicate cue type and reason): pt eager to d/c rw and ask "do you want to see me do it without the walker". Pt strongly dislikes using RW . Where Assessed - Grooming: Standing at sink Toilet Transfer: Research scientist (life sciences) Method: Proofreader: Regular height toilet;Grab bars Toileting - Clothing Manipulation: Performed;Supervision/safety Where Assessed - Toileting Clothing Manipulation: Sit to stand from 3-in-1 or toilet Toileting - Hygiene: Performed;Supervision/safety Where Assessed - Toileting Hygiene: Sit to stand from 3-in-1 or toilet Equipment Used: Rolling walker Ambulation Related to ADLs: PT ambulating >200 ft with dypnea 2 out 4. Pt reports no fatigue s/p walk. Pt states "they have told me so much about falling that I am scared of falling so I have to watch where I am going and watch out for other people" Pt startled when RN touched shoulder on Lt side. Pt completely unaware of RN approaching for Lt side. Pt laughed in response to her startled response. Pt ambulated S level ADL Comments: Pt pleasant eager to participate and tells daughter on her arrival "this is the best one! She is doing a wonderful job" in response to therapy session. Pt enjoying participation. Mobility  Bed Mobility Bed Mobility: No Transfers Transfers: Yes Sit to Stand: 5: Supervision;With upper extremity assist;From toilet;From bed Stand to Sit: 5: Supervision;With upper extremity assist;With armrests;To chair/3-in-1 Exercises    End of Session OT - End of Session Activity Tolerance: Patient tolerated treatment  well Patient left: in chair;with  call bell in reach;with family/visitor present (daughter) Nurse Communication: Mobility status for transfers;Mobility status for ambulation General Behavior During Session: [facility] for tasks performed Cognition: [facility] for tasks performed  Lucile Shutters  12/24/2011, 2:29 PM  Pager: [phone]

## 2011-12-24 NOTE — Progress Notes (Signed)
   CARE MANAGEMENT NOTE 12/24/2011  Patient:  [name]   Account Number:  [id number]  Date Initiated:  12/23/2011  Documentation initiated by:  Onnie Boer  Subjective/Objective Assessment:   PT WAS ADMITTED WITH BACK PAIN     Action/Plan:   PROGRESSION OF CARE AND DISCHARGE PLANNING   Anticipated DC Date:  12/23/2011   Anticipated DC Plan:  HOME W HOME HEALTH SERVICES      DC Planning Services  CM consult      Choice offered to / List presented to:             Status of service:  Completed, signed off Medicare Important Message given?   (If response is "NO", the following Medicare IM given date fields will be blank) Date Medicare IM given:   Date Additional Medicare IM given:    Discharge Disposition:  HOME/SELF CARE  Per UR Regulation:  Reviewed for med. necessity/level of care/duration of stay  If discussed at Long Length of Stay Meetings, dates discussed:    Comments:  12/24/11 Onnie Boer, RN, BSN 1507 SPOKE WITH PT'S DAUGHTER TODAY SHE IS VERY CONCERNED WITH THE RESULTS OF THE BONE SCAN AND WOULD LIKE FOR THE PT TO REMAIN IN THE HOSPITAL IF SHE NEEDS KYPHOPLASTY.  I HAVE OFFERED HH CHOICES FOR HH PT/AIDE, DAUGHTER HAS REFUSED IT STATING THAT THE PT IS VERY HARD TO PLEASE AT THIS TIME AND DOESNT WANT ANY HELP AND WANTS TO BE INDEPENDENT.  WILL ASK ATTENDING TO CALL DAUGHTER TO DISCUSS PLANS.  WILL F/U.   12/23/2011 1200 Spoke to daughter, [name] # [phone]. Explained Condition 44 and pt recommendation for obs on 3/9 and 3/10 and d/c scheduled for today if she is stable. Daughter signed paperwork and placed on shadow chart. States no HH or DME is needed for home. She is going to get bed rails for pt. Desiree Donning RN CCM Case Mgmt phone [phone]  12/22/11 Onnie Boer, RN, BSN 1122 PT WANTS TO GO HOME WITH CARE FROM HER DAUGHTER AFTER COMPLETION OF A BONE SCAN TODAY.  ID HAD BEEN CONSULTED TO DETERMINE NEED OF KYPHOPLASTY.  WILL F/U.  PT NOT  MEETING INPATIENT AND CC44 IS GOING TO BE ISSUED TODAY.

## 2011-12-25 ENCOUNTER — Observation Stay (HOSPITAL_COMMUNITY): Payer: Medicare Other

## 2011-12-25 MED ORDER — MIDAZOLAM HCL 5 MG/5ML IJ SOLN
INTRAMUSCULAR | Status: AC | PRN
  Administered 2011-12-25 (×3): 1 mg via INTRAVENOUS

## 2011-12-25 MED ORDER — FENTANYL CITRATE 0.05 MG/ML IJ SOLN
INTRAMUSCULAR | Status: AC | PRN
  Administered 2011-12-25 (×3): 25 ug via INTRAVENOUS

## 2011-12-25 MED ORDER — NALOXONE HCL 1 MG/ML IJ SOLN
INTRAMUSCULAR | Status: AC
  Filled 2011-12-25: qty 2

## 2011-12-25 MED ORDER — CALCITONIN (SALMON) 200 UNIT/ACT NA SOLN: 1.0000 | Freq: Every day | NASAL | Status: DC

## 2011-12-25 MED ORDER — HYDRALAZINE HCL 20 MG/ML IJ SOLN
INTRAMUSCULAR | Status: AC
  Filled 2011-12-25: qty 2

## 2011-12-25 MED ORDER — TRAMADOL HCL 50 MG PO TABS: 50.0000 mg | ORAL_TABLET | Freq: Two times a day (BID) | ORAL | Status: AC

## 2011-12-25 MED ORDER — FENTANYL CITRATE 0.05 MG/ML IJ SOLN
INTRAMUSCULAR | Status: AC
  Filled 2011-12-25: qty 4

## 2011-12-25 MED ORDER — VITAMIN D3 25 MCG (1000 UNIT) PO TABS: 2000.0000 [IU] | ORAL_TABLET | Freq: Every day | ORAL | Status: AC

## 2011-12-25 MED ORDER — FLUMAZENIL 0.5 MG/5ML IV SOLN
INTRAVENOUS | Status: AC
  Filled 2011-12-25: qty 5

## 2011-12-25 MED ORDER — SODIUM CHLORIDE 0.9 % IV SOLN
INTRAVENOUS | Status: AC
  Administered 2011-12-25: 14:00:00 via INTRAVENOUS

## 2011-12-25 MED ORDER — OXYCODONE-ACETAMINOPHEN 5-325 MG PO TABS: 1.0000 | ORAL_TABLET | Freq: Three times a day (TID) | ORAL | Status: AC | PRN

## 2011-12-25 MED ORDER — CEFAZOLIN SODIUM 1-5 GM-% IV SOLN
INTRAVENOUS | Status: AC
  Administered 2011-12-25: 1 g via INTRAVENOUS
  Filled 2011-12-25: qty 50

## 2011-12-25 MED ORDER — MIDAZOLAM HCL 2 MG/2ML IJ SOLN
INTRAMUSCULAR | Status: AC
  Filled 2011-12-25: qty 4

## 2011-12-25 NOTE — Discharge Instructions (Signed)
Wear your back brace, it should help with your pain. Try the tramadol for pain. Call Dr Fatima Sanger office to have him review her CT scan to see if she would benefit from more kyphoplasty. You can also have her evaluated by the neurosurgeon on call, Dr Danielle Dess. Call his office for an appointment. He may be able to advise you on further treatment such as the braces or physical therapy. Marland Kitchen  POST VP  T 9. 1 No stooping or lifting more than 10 lbs for 2 weeks  2. Use walker for 2 weeks  3. RTC in 2 weeks [phone]

## 2011-12-25 NOTE — Procedures (Signed)
S/P VP at T 9

## 2011-12-25 NOTE — Progress Notes (Signed)
Occupational Therapy Treatment Patient Details Name: [name] MRN: [id number] DOB: [birth year] Today's Date: 12/25/2011  OT Assessment/Plan OT Assessment/Plan Comments on Treatment Session: Pt pleasant and progressing with therapy. Pt is at adequate level of participation for d/c . OT Plan: Discharge plan remains appropriate OT Frequency: Min 2X/week Follow Up Recommendations: Home health OT;Supervision/Assistance - 24 hour Equipment Recommended: None recommended by OT OT Goals Acute Rehab OT Goals OT Goal Formulation: With patient Time For Goal Achievement: 2 weeks ADL Goals Pt Will Perform Grooming: with supervision;Standing at sink ADL Goal: Grooming - Progress: Progressing toward goals Pt Will Perform Lower Body Bathing: with supervision;Sit to stand from chair;Sit to stand from bed ADL Goal: Lower Body Bathing - Progress: Met Pt Will Perform Lower Body Dressing: with supervision;Sit to stand from chair;Sit to stand from bed ADL Goal: Lower Body Dressing - Progress: Met Pt Will Transfer to Toilet: with supervision;Ambulation;Comfort height toilet ADL Goal: Toilet Transfer - Progress: Progressing toward goals Pt Will Perform Toileting - Clothing Manipulation: with supervision;Standing ADL Goal: Toileting - Clothing Manipulation - Progress: Progressing toward goals Additional ADL Goal #1: Pt. will adhere to back precautions with min verbal cues ADL Goal: Additional Goal #1 - Progress: Met  OT Treatment Precautions/Restrictions  Precautions Precautions: Back;Fall Precaution Comments: back precautions Required Braces or Orthoses: Yes Spinal Brace: Thoracolumbosacral orthotic (pt declines to wear TLSO brace) Restrictions Weight Bearing Restrictions: No   ADL ADL Eating/Feeding: Performed;Modified independent Where Assessed - Eating/Feeding: Bed level Upper Body Dressing: Performed;Modified independent Where Assessed - Upper Body Dressing: Sitting, chair;Unsupported  (buttoned shirt independently ) Lower Body Dressing: Performed;Supervision/safety Lower Body Dressing Details (indicate cue type and reason): educated on crossing BIL LE to don pants and shoes. Pt completed task 100% correct . Pt pulling on RW for sit<>stand. Pt placing hand in the middle of the walker and on hand rest. Pt was previously educated PTA on using the RW this way and consistently places hand in the middle of walker to attend to stabilize it. Where Assessed - Lower Body Dressing: Sit to stand from bed;Unsupported Equipment Used: Rolling walker ADL Comments: Pt supine<>sit Mod I. Pt pleasant and smiling thanking staff for coming to see her. Pt is cute and appreciative of our time. Pt able to complete UB and LB dressing with Min v/c and visual cues (due to hearing loss) for safety with back precautions. Pt eager to go home today. Pt positioned in chair with daughter present. Mobility  Bed Mobility Bed Mobility: Yes Supine to Sit: 7: Independent Transfers Transfers: Yes Sit to Stand: 5: Supervision;With upper extremity assist;From bed Sit to Stand Details (indicate cue type and reason): pt pulling on RW  Stand to Sit: 5: Supervision;With upper extremity assist;With armrests;To chair/3-in-1 Stand to Sit Details: Pt required Min tactile cues to keep RW close and reach back to arm rest. Pt attempting to hold onto RW for stand<>sit. Pt re-educated on correct techniques. Exercises    End of Session OT - End of Session Activity Tolerance: Patient tolerated treatment well Patient left: in chair;with call bell in reach;with family/visitor present (daughter) Nurse Communication: Mobility status for transfers;Mobility status for ambulation General Behavior During Session: [facility] for tasks performed Cognition: [facility] for tasks performed  Lucile Shutters  12/25/2011, 3:29 PM Pager: [phone]

## 2011-12-25 NOTE — Progress Notes (Signed)
   CARE MANAGEMENT NOTE 12/25/2011  Patient:  [name], [name]   Account Number:  [id number]  Date Initiated:  12/23/2011  Documentation initiated by:  [name]  Subjective/Objective Assessment:   PT WAS ADMITTED WITH BACK PAIN     Action/Plan:   PROGRESSION OF CARE AND DISCHARGE PLANNING   Anticipated DC Date:  12/23/2011   Anticipated DC Plan:  HOME W HOME HEALTH SERVICES      DC Planning Services  CM consult      Choice offered to / List presented to:             Status of service:  Completed, signed off Medicare Important Message given?   (If response is "NO", the following Medicare IM given date fields will be blank) Date Medicare IM given:   Date Additional Medicare IM given:    Discharge Disposition:  HOME/SELF CARE  Per UR Regulation:  Reviewed for med. necessity/level of care/duration of stay  If discussed at Long Length of Stay Meetings, dates discussed:    Comments:  12/25/11 Desiree Breach, RN,BSN 1448 PT HAVING KYPHOPLASTY PER DAUGHTERS REQUEST TODAY.  PT WILL BE DC'D TO HOME WITH HER DAUGHTER WITH 24 HR SUPERVISION. PT/ DAUGHTER HAS DENIED ANY HH RESOURCES AND SERVICES.  PT HAS TO BE FLAT FOR SERVERAL HRS PRIOR TO DC.  12/24/11 Desiree Boer, RN, BSN 1507 SPOKE WITH PT'S DAUGHTER TODAY SHE IS VERY CONCERNED WITH THE RESULTS OF THE BONE SCAN AND WOULD LIKE FOR THE PT TO REMAIN IN THE HOSPITAL IF SHE NEEDS KYPHOPLASTY.  I HAVE OFFERED HH CHOICES FOR HH PT/AIDE, DAUGHTER HAS REFUSED IT STATING THAT THE PT IS VERY HARD TO PLEASE AT THIS TIME AND DOESNT WANT ANY HELP AND WANTS TO BE INDEPENDENT.  WILL ASK ATTENDING TO CALL DAUGHTER TO DISCUSS PLANS.  WILL F/U.   12/23/2011 1200 Spoke to daughter, [name] # [phone]. Explained Condition 44 and pt recommendation for obs on 3/9 and 3/10 and d/c scheduled for today if she is stable. Daughter signed paperwork and placed on shadow chart. States no HH or DME is needed for home. She is going to get bed rails for  pt. Desiree Donning RN CCM Case Mgmt phone [phone]  12/22/11 Desiree Boer, RN, BSN 1122 PT WANTS TO GO HOME WITH CARE FROM HER DAUGHTER AFTER COMPLETION OF A BONE SCAN TODAY.  ID HAD BEEN CONSULTED TO DETERMINE NEED OF KYPHOPLASTY.  WILL F/U.  PT NOT MEETING INPATIENT AND CC44 IS GOING TO BE ISSUED TODAY.

## 2011-12-26 ENCOUNTER — Other Ambulatory Visit (HOSPITAL_COMMUNITY): Payer: Self-pay | Admitting: Interventional Radiology

## 2011-12-26 ENCOUNTER — Telehealth (HOSPITAL_COMMUNITY): Payer: Self-pay | Admitting: Radiology

## 2011-12-26 DIAGNOSIS — IMO0002 Reserved for concepts with insufficient information to code with codable children: Secondary | ICD-10-CM

## 2011-12-26 MED FILL — Tobramycin Sulfate For Inj 1.2 GM: INTRAMUSCULAR | Qty: 1 | Status: AC

## 2012-01-05 NOTE — Discharge Summary (Addendum)
Physician Discharge Summary  Patient ID: ALVERNA FAWLEY MRN: [id number] DOB/AGE: 01/21/1919 78 y.o.  Admit date: 12/20/2011 Discharge date: 12/25/2011 Primary Care Physician:  No primary provider on file.   Discharge Diagnoses:    Active Problems:  Hypothyroidism  Lumbar compression fracture  Acute back pain  Osteoporosis  Hypertension  History of dizziness    Medication List  As of 01/05/2012  1:37 PM   TAKE these medications         calcitonin (salmon) 200 UNIT/ACT nasal spray   Commonly known as: MIACALCIN/FORTICAL   Place 1 spray into the nose daily.      cholecalciferol 1000 UNITS tablet   Commonly known as: VITAMIN D   Take 2 tablets (2,000 Units total) by mouth daily.      levothyroxine 50 MCG tablet   Commonly known as: SYNTHROID, LEVOTHROID   Take 50 mcg by mouth daily.      losartan 50 MG tablet   Commonly known as: COZAAR   Take 50 mg by mouth daily.      triamterene-hydrochlorothiazide 37.5-25 MG per capsule   Commonly known as: DYAZIDE   Take 1 capsule by mouth daily.             Disposition and Follow-up: Discharged home in stable condition under the care of her daughter. F/U with Dr. Titus Dubin and her PCP in 1 week.  Consults: Interventional Radiology  Significant Diagnostic Studies:  Ct Abdomen Pelvis Wo Contrast  12/20/2011  *RADIOLOGY REPORT*  Clinical Data: Back pain and abdominal pain.  CT ABDOMEN AND PELVIS WITHOUT CONTRAST  Technique:  Multidetector CT imaging of the abdomen and pelvis was performed following the standard protocol without intravenous contrast.  Comparison: No priors.  Findings:  Lung Bases: There is a small hiatal hernia. The visualized lung parenchyma is unremarkable.  No definite pleural effusions.  Mild cardiomegaly.  Atherosclerotic calcifications within the left circumflex and right coronary arteries.  Abdomen/Pelvis:  Image #20 of series 2 demonstrates a 6 mm nonobstructive calculus within the right renal collecting system.  No additional abnormal urinary tract calcifications are noted within either collecting system or along the course of either ureter.  No stones identified within the lumen of the urinary bladder.  There are multiple phleboliths within the left gonadal vein (which at first glance appear potential for left ureteral calculi, however, they are clearly separate from the left ureter).  The unenhanced appearance of the liver, gallbladder, pancreas and spleen is unremarkable.  Mild bilateral adreniform thickening is noted (nonspecific).  There are numerous round well circumscribed low attenuation lesions noted in the kidneys bilaterally, the largest of which extends exophytically from the right lower pole measuring 6.9 cm in diameter.  None of these are adequately characterized on this noncontrast CT scan, but are statistically favored to represent cysts.  Numerous colonic diverticula, without surrounding inflammatory changes noted at this time to suggest acute diverticulitis.  No ascites or pneumoperitoneum and no pathologic distension of bowel. No definite pathologic adenopathy appreciated within the abdomen or pelvis.  There is extensive atherosclerosis of the abdominal pelvic vasculature, without frank aneurysm.  Status post hysterectomy.  A 2.1 cm low attenuation lesion in the right ovary.  Left ovary is unremarkable.  Urinary bladder is normal in appearance.  Incidental note is made of small bilateral inguinal hernias containing only fat.  Musculoskeletal: There are no aggressive appearing lytic or blastic lesions noted in the visualized portions of the skeleton.  There are multiple compression fractures (T10, T11,  L1, and L4), most severe at T10, T11 and L1 where there is approximately 50% loss of vertebral body height.  Post procedural changes of vertebroplasty are also noted at T11 and L1.  Mild retrolisthesis of L2 upon L3 is noted (likely chronic).  Mild (grade 1) anterolisthesis of L4 upon L5 is also noted.   IMPRESSION: 1.  6 mm nonobstructive calculus in the right renal collecting system.  No ureteral calculi or signs of the urinary tract obstruction on today's examination. 2.  Colonic diverticulosis without findings to suggest acute diverticulitis. 3.  Multiple low-attenuation renal lesions bilaterally, largest of which measures up to 6.9 cm in diameter extending exophytically off the lower pole of the right kidney.  None of these are technically characterized on this noncontrast CT examination, however, these are statistically favored to represent cysts.  These can be further evaluated with non emergent renal ultrasound if clinically indicated. 4.  Extensive atherosclerosis including left circumflex and right coronary artery disease. Assessment for potential risk factor modification, dietary therapy or pharmacologic therapy may be warranted, if clinically indicated. 5.  2.1 cm low attenuation lesion in the right adnexa likely represents a small ovarian cyst.  This is unusual in an elderly patient of this age, and could be further evaluated with transvaginal ultrasound if clinically indicated. 6.  Status post hysterectomy. 7.  Small hiatal hernia. 8.  Multiple vertebral compression fractures with a post procedural changes of vertebroplasty in the thoracic and lumbar spine, as detailed above.  Original Report Authenticated By: Florencia Reasons, M.D.    Brief H and P: [name] is an 78 y.o. female with a long history of lumbar compression fractures s/p kyphoplasty done in 2008 who presented to the ER after a fall at home. History obtained from patient and her daughter. She states sometime in December she was having muscle spasms in her back and her primary care doctor put her on Flexeril and Percocet. She was doing better however the pain returned and she put her on hydrocodone. She relates over the past 48 hours her pain has been getting more intense. She relates now she has diffuse abdominal pain and it  shoots into her back and she feels like her abdomen is distended. She relates she was constipated however she did some laxatives in 2 days ago had good results. She states that having the bowel movements did not improve her discomfort. She denies nausea, vomiting, and and states she only has pain when she moves. She had a CT of abdomen that was negative. It did show new lumbar compression fractures thought to be related to her severe pain. She has severe pain with certain movements. She has pain with ambulation and because she lives alone, family is really concerned that it is not safe for her to return home at this time. She says that she cannot cook for herself. She has difficulty turning in the bed and has difficulty with ambulation around the house. She is hard of hearing. She is a very high risk for fall at this time.    Hospital Course:  Active Problems:  Hypothyroidism  Lumbar compression fracture  Acute back pain  Osteoporosis  Hypertension  History of dizziness  Patient admitted with T9 Acute compression fracture. Kyphoplasty was done given immobility by Dr. Titus Dubin. She was started Calcitonin and Vitamin D, hx of falls, given prn oxycodone for pain, will continue to mobilize with PT at home. She was discharegd home after kyphoplasty but she clearly needs  24/7 supervision, her daughter has arranged for sitters, patient refuses help -wants to live independently or alone-agreeable to help temporarily. I discussed problems with this arrangement with daughter. Patient resistant to options. 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 Will need PT in the Home and as much support as possible to avoid additional falls and readmisison. I also introduced the concept of palliative care and advance care planning, and  need to establish patient's code status.   Time spent on Discharge:  Signed: [facility] Pager: [phone] 01/05/2012, 1:37 PM

## 2012-01-06 ENCOUNTER — Other Ambulatory Visit (HOSPITAL_COMMUNITY): Payer: Self-pay | Admitting: Physician Assistant

## 2012-01-08 ENCOUNTER — Other Ambulatory Visit (HOSPITAL_COMMUNITY): Payer: Self-pay | Admitting: Interventional Radiology

## 2012-01-08 ENCOUNTER — Ambulatory Visit (HOSPITAL_COMMUNITY)
Admission: RE | Admit: 2012-01-08 | Discharge: 2012-01-08 | Disposition: A | Discharge status: Home / Self Care | Payer: Medicare Other | Source: Ambulatory Visit | Attending: Interventional Radiology | Admitting: Interventional Radiology

## 2012-01-08 ENCOUNTER — Encounter (HOSPITAL_COMMUNITY): Payer: Self-pay | Admitting: Pharmacy Technician

## 2012-01-08 ENCOUNTER — Other Ambulatory Visit: Payer: Self-pay | Admitting: Radiology

## 2012-01-08 DIAGNOSIS — IMO0002 Reserved for concepts with insufficient information to code with codable children: Secondary | ICD-10-CM

## 2012-01-08 DIAGNOSIS — S22009A Unspecified fracture of unspecified thoracic vertebra, initial encounter for closed fracture: Secondary | ICD-10-CM | POA: Insufficient documentation

## 2012-01-08 DIAGNOSIS — K5900 Constipation, unspecified: Secondary | ICD-10-CM | POA: Insufficient documentation

## 2012-01-08 DIAGNOSIS — F29 Unspecified psychosis not due to a substance or known physiological condition: Secondary | ICD-10-CM | POA: Insufficient documentation

## 2012-01-08 DIAGNOSIS — R911 Solitary pulmonary nodule: Secondary | ICD-10-CM | POA: Insufficient documentation

## 2012-01-08 DIAGNOSIS — X58XXXA Exposure to other specified factors, initial encounter: Secondary | ICD-10-CM | POA: Insufficient documentation

## 2012-01-08 NOTE — Progress Notes (Signed)
Patient was seen today for 2 week follow up examination post T9 vertebroplasty.  Patient had done very well initially, however had developed a pneumonia with significant cough - now with new onset  Of lower back pain with radicular symptoms around the hips to the pelvis bilaterally.  Patient was extremely tender to palpation, FROM of all extremities.  MRI ordered stat which revealed a new compression fracture of T12 amendable to KP.  Daughter and patient are in agreement to proceed with KP for pain control and fracture stabilization - scheduled for 3/28 at 11:00.

## 2012-01-09 ENCOUNTER — Ambulatory Visit (HOSPITAL_COMMUNITY)
Admission: RE | Admit: 2012-01-09 | Discharge: 2012-01-09 | Disposition: A | Discharge status: Home / Self Care | Payer: Medicare Other | Source: Ambulatory Visit | Attending: Interventional Radiology | Admitting: Interventional Radiology

## 2012-01-09 ENCOUNTER — Encounter (HOSPITAL_COMMUNITY): Payer: Self-pay

## 2012-01-09 DIAGNOSIS — S22009A Unspecified fracture of unspecified thoracic vertebra, initial encounter for closed fracture: Secondary | ICD-10-CM | POA: Insufficient documentation

## 2012-01-09 DIAGNOSIS — X58XXXA Exposure to other specified factors, initial encounter: Secondary | ICD-10-CM | POA: Insufficient documentation

## 2012-01-09 DIAGNOSIS — E079 Disorder of thyroid, unspecified: Secondary | ICD-10-CM | POA: Insufficient documentation

## 2012-01-09 DIAGNOSIS — IMO0002 Reserved for concepts with insufficient information to code with codable children: Secondary | ICD-10-CM

## 2012-01-09 DIAGNOSIS — E119 Type 2 diabetes mellitus without complications: Secondary | ICD-10-CM | POA: Insufficient documentation

## 2012-01-09 LAB — PROTIME-INR: INR: 1.04 (ref 0.00–1.49)

## 2012-01-09 LAB — BASIC METABOLIC PANEL
BUN: 23 mg/dL (ref 6–23)
CO2: 26 mEq/L (ref 19–32)
Chloride: 99 mEq/L (ref 96–112)
Creatinine, Ser: 0.81 mg/dL (ref 0.50–1.10)
GFR calc Af Amer: 71 mL/min — ABNORMAL LOW (ref >90)
Potassium: 3.1 mEq/L — ABNORMAL LOW (ref 3.5–5.1)

## 2012-01-09 LAB — CBC
HCT: 38.5 % (ref 36.0–46.0)
Hemoglobin: 13.2 g/dL (ref 12.0–15.0)
MCHC: 34.3 g/dL (ref 30.0–36.0)
MCV: 85.9 fL (ref 78.0–100.0)
WBC: 8.8 10*3/uL (ref 4.0–10.5)

## 2012-01-09 MED ORDER — CEFAZOLIN SODIUM 1-5 GM-% IV SOLN
1.0000 g | Freq: Once | INTRAVENOUS | Status: AC
  Administered 2012-01-09: 1 g via INTRAVENOUS

## 2012-01-09 MED ORDER — NALOXONE HCL 0.4 MG/ML IJ SOLN
INTRAMUSCULAR | Status: AC
  Filled 2012-01-09: qty 1

## 2012-01-09 MED ORDER — HYDROMORPHONE HCL PF 1 MG/ML IJ SOLN
INTRAMUSCULAR | Status: AC
  Filled 2012-01-09: qty 2

## 2012-01-09 MED ORDER — IOHEXOL 300 MG/ML  SOLN: 50.0000 mL | Freq: Once | INTRAMUSCULAR | Status: AC | PRN

## 2012-01-09 MED ORDER — FLUMAZENIL 0.5 MG/5ML IV SOLN
INTRAVENOUS | Status: AC
  Filled 2012-01-09: qty 5

## 2012-01-09 MED ORDER — SODIUM CHLORIDE 0.9 % IV SOLN: INTRAVENOUS | Status: AC

## 2012-01-09 MED ORDER — SODIUM CHLORIDE 0.9 % IV SOLN: Freq: Once | INTRAVENOUS | Status: DC

## 2012-01-09 MED ORDER — CEFAZOLIN SODIUM 1-5 GM-% IV SOLN
INTRAVENOUS | Status: AC
  Filled 2012-01-09: qty 50

## 2012-01-09 MED ORDER — MIDAZOLAM HCL 2 MG/2ML IJ SOLN
INTRAMUSCULAR | Status: AC
  Filled 2012-01-09: qty 6

## 2012-01-09 MED ORDER — FENTANYL CITRATE 0.05 MG/ML IJ SOLN
INTRAMUSCULAR | Status: AC
  Filled 2012-01-09: qty 6

## 2012-01-09 MED ORDER — FENTANYL CITRATE 0.05 MG/ML IJ SOLN
INTRAMUSCULAR | Status: DC | PRN
  Administered 2012-01-09: 25 ug via INTRAVENOUS
  Administered 2012-01-09: 12.5 ug via INTRAVENOUS
  Administered 2012-01-09: 25 ug via INTRAVENOUS
  Administered 2012-01-09: 12.5 ug via INTRAVENOUS

## 2012-01-09 MED ORDER — TOBRAMYCIN SULFATE 1.2 G IJ SOLR
INTRAMUSCULAR | Status: AC
  Filled 2012-01-09: qty 1.2

## 2012-01-09 MED ORDER — MIDAZOLAM HCL 5 MG/5ML IJ SOLN
INTRAMUSCULAR | Status: DC | PRN
  Administered 2012-01-09: 1 mg via INTRAVENOUS
  Administered 2012-01-09 (×2): 0.5 mg via INTRAVENOUS

## 2012-01-09 NOTE — Discharge Instructions (Signed)
1. No stooping ,bending lifting more than 10 lbs for 2 weeks  2.Use walker for 2 weeks  3.RTC in 2 weeks .    KYPHOPLASTY/VERTEBROPLASTY DISCHARGE INSTRUCTIONS  Medications: (check all that apply)     Resume all home medications as before procedure.       Resume your (aspirin/Plavix/Coumadin) on MD.                  Continue your pain medications as prescribed as needed.  Over the next 3-5 days, decrease your pain medication as tolerated.  Over the counter medications (i.e. Tylenol, ibuprofen, and aleve) may be substituted once severe/moderate pain symptoms have subsided.   Wound Care:   Bandages may be removed the day following your procedure.  You may get your incision wet once bandages are removed.  Bandaids may be used to cover the incisions until scab formation.  Topical ointments are optional.    If you develop a fever greater than 101 degrees, have increased skin redness at the incision sites or pus-like oozing from incisions occurring within 1 week of the procedure, contact radiology at [phone] or [phone].    Ice pack to back for 15-20 minutes 2-3 time per day for first 2-3 days post procedure.  The ice will expedite muscle healing and help with the pain from the incisions.   Activity:   Bedrest today with limited activity for 24 hours post procedure.    No driving for 48 hours.    Increase your activity as tolerated after bedrest (with assistance if necessary).    Refrain from any strenuous activity or heavy lifting (greater than 10 lbs.).   Follow up:   Contact radiology at [phone] or [phone] if any questions/concerns.    A physician assistant from radiology will contact you in approximately 1 week.    If a biopsy was performed at the time of your procedure, your referring physician should receive the results in usually 2-3 days.

## 2012-01-09 NOTE — H&P (Signed)
[name] is an 78 y.o. female.   Chief Complaint: back pain; new fracture at Thoracic #12 Recent kyphoplasty T9 HPI: scheduled now for Thoracic #12 kyphoplasty  Past Medical History  Diagnosis Date  . Diabetes mellitus   . Thyroid disease     Past Surgical History  Procedure Date  . Cesarean section   . Abdominal hysterectomy     No family history on file. Social History:  reports that she has never smoked. She does not have any smokeless tobacco history on file. She reports that she does not drink alcohol or use illicit drugs.  Allergies:  Allergies  Allergen Reactions  . Aspirin Other (See Comments)    Nose bleeds.     Medications Prior to Admission  Medication Sig Dispense Refill  . cholecalciferol (VITAMIN D) 1000 UNITS tablet Take 2 tablets (2,000 Units total) by mouth daily.  60 tablet  12  . HYDROCODONE-GUAIFENESIN PO Take 5 mLs by mouth every 6 (six) hours as needed. For cough      . levothyroxine (SYNTHROID, LEVOTHROID) 50 MCG tablet Take 50 mcg by mouth daily.      Marland Kitchen losartan (COZAAR) 50 MG tablet Take 50 mg by mouth daily.      . traMADol (ULTRAM) 50 MG tablet Take 50 mg by mouth every 12 (twelve) hours.      . triamterene-hydrochlorothiazide (DYAZIDE) 37.5-25 MG per capsule Take 1 capsule by mouth daily.       Medications Prior to Admission  Medication Dose Route Frequency Provider Last Rate Last Dose  . 0.9 %  sodium chloride infusion   Intravenous Once Brayton El, PA      . ceFAZolin (ANCEF) IVPB 1 g/50 mL premix  1 g Intravenous Once Brayton El, PA        Results for orders placed during the hospital encounter of 01/09/12 (from the past 48 hour(s))  APTT     Status: Normal   Collection Time   01/09/12  9:35 AM      Component Value Range Comment   aPTT 28  24 - 37 (seconds)   BASIC METABOLIC PANEL     Status: Abnormal   Collection Time   01/09/12  9:35 AM      Component Value Range Comment   Sodium 137  135 - 145 (mEq/L)    Potassium 3.1 (*)  3.5 - 5.1 (mEq/L)    Chloride 99  96 - 112 (mEq/L)    CO2 26  19 - 32 (mEq/L)    Glucose, Bld 118 (*) 70 - 99 (mg/dL)    BUN 23  6 - 23 (mg/dL)    Creatinine, Ser 1.61  0.50 - 1.10 (mg/dL)    Calcium 09.6  8.4 - 10.5 (mg/dL)    GFR calc non Af Amer 62 (*) >90 (mL/min)    GFR calc Af Amer 71 (*) >90 (mL/min)   CBC     Status: Normal   Collection Time   01/09/12  9:35 AM      Component Value Range Comment   WBC 8.8  4.0 - 10.5 (K/uL)    RBC 4.48  3.87 - 5.11 (MIL/uL)    Hemoglobin 13.2  12.0 - 15.0 (g/dL)    HCT 04.5  40.9 - 81.1 (%)    MCV 85.9  78.0 - 100.0 (fL)    MCH 29.5  26.0 - 34.0 (pg)    MCHC 34.3  30.0 - 36.0 (g/dL)    RDW 91.4  78.2 -  15.5 (%)    Platelets 336  150 - 400 (K/uL)   PROTIME-INR     Status: Normal   Collection Time   01/09/12  9:35 AM      Component Value Range Comment   Prothrombin Time 13.8  11.6 - 15.2 (seconds)    INR 1.04  0.00 - 1.49     Mr Lumbar Spine Wo Contrast  01/08/2012  *RADIOLOGY REPORT*  Clinical Data: Persistent back pain status post T9 vertebroplasty 12/25/2011.  MRI LUMBAR SPINE WITHOUT CONTRAST  Technique:  Multiplanar and multiecho pulse sequences of the lumbar spine were obtained without intravenous contrast.  Comparison: Whole body bone scan 12/23/2011, abdominal CT 12/20/2011 and thoracic spine CT 09/23/2007.  Findings: There is a mild convex right scoliosis.  Sagittal images extend from the mid T8 level through the mid sacrum.  A biconcave compression fracture at T9 is associated with mild residual marrow edema status post recent vertebroplasty.  There are chronic healed compression deformities involving the T10, T11, L1, L2, L3 and L4 vertebral bodies.  There are remote vertebroplasty changes at T11 and L1.  There is no residual marrow edema associated with any of these fractures.  There is a superior endplate compression fracture at T12 resulting in approximately 25% loss of vertebral body height.  This does not appear substantially changed  from the recent abdominal CT but is new compared with the prior chest CT.  There is prominent bone marrow edema throughout the T12 vertebral body and its pedicles. There is a small amount of anterior paraspinal hemorrhage.  No epidural hematoma is demonstrated.  No other acute fractures are identified.  There is no evidence of osseous metastatic disease.  The conus medullaris extends to the L1-L2 level and appears normal. Images through the lung bases demonstrate patchy nodular densities in both lower lobes, not apparent on recent chest CT.  These could reflect atelectasis or early pneumonia. There are bilateral renal cysts.  Mild spondylosis is noted throughout the lumbar spine without resulting high-grade spinal stenosis or nerve root encroachment.  IMPRESSION:  1.  Superior endplate compression fracture at T12 is associated with marrow and surrounding soft tissue edema consistent with an acute / early subacute benign osteoporotic compression deformity. This fracture was inapparent on the bone scan 12/23/2011. 2.  Recent vertebroplasty changes at T9 without demonstrated complication.  The T9 fracture appears stable. 3.  Multiple healed fractures (T10, T11, L1, L2, L3 and L4). 4.  New nodular densities in both lung bases may reflect atelectasis although are concerning for possible aspiration.  Chest radiographic correlation recommended.  Original Report Authenticated By: Gerrianne Scale, M.D.    Review of Systems  Constitutional: Negative for fever and chills.  HENT: Positive for hearing loss.        Very HOH  Respiratory: Negative for cough.   Cardiovascular: Negative for chest pain.  Gastrointestinal: Negative for nausea and vomiting.  Genitourinary: Positive for urgency and frequency.       Dtr states pt has had urine checked and has NO inf  Musculoskeletal: Positive for back pain.  Neurological: Negative for headaches.    Blood pressure 151/85, pulse 91, temperature 96.6 F (35.9 C),  temperature source Oral, resp. rate 14, height 5\' 2"  (1.575 m), weight 117 lb (53.071 kg), SpO2 94.00%. Physical Exam  Constitutional: She is oriented to person, place, and time. She appears well-developed.  HENT:  Head: Normocephalic.  Eyes: EOM are normal.  Cardiovascular: Normal rate, regular rhythm and normal heart  sounds.   No murmur heard. Respiratory: Effort normal and breath sounds normal. She has no wheezes.  GI: Soft. Bowel sounds are normal. There is no tenderness.  Musculoskeletal: Normal range of motion.       Uses walker  Neurological: She is alert and oriented to person, place, and time.  Skin: Skin is warm.     Assessment/Plan T 12 fx;scheduled now for KP T9 KP 2 weeks ago Pt and daughter aware of procedure benefits and risks and agreeable to proceed. Consent signed.  Minda Faas A 01/09/2012, 10:25 AM

## 2012-01-09 NOTE — Procedures (Signed)
S/P T 12 KP 

## 2012-01-11 ENCOUNTER — Telehealth (HOSPITAL_COMMUNITY): Payer: Self-pay | Admitting: Radiology

## 2012-01-12 ENCOUNTER — Telehealth (HOSPITAL_COMMUNITY): Payer: Self-pay

## 2012-01-16 ENCOUNTER — Encounter (HOSPITAL_COMMUNITY): Payer: Self-pay

## 2012-01-16 ENCOUNTER — Emergency Department (HOSPITAL_COMMUNITY)
Admission: EM | Admit: 2012-01-16 | Discharge: 2012-01-17 | Disposition: A | Discharge status: Home / Self Care | Payer: Medicare Other | Attending: Emergency Medicine | Admitting: Emergency Medicine

## 2012-01-16 ENCOUNTER — Telehealth (HOSPITAL_COMMUNITY): Payer: Self-pay

## 2012-01-16 ENCOUNTER — Emergency Department (HOSPITAL_COMMUNITY): Payer: Medicare Other

## 2012-01-16 DIAGNOSIS — R209 Unspecified disturbances of skin sensation: Secondary | ICD-10-CM | POA: Insufficient documentation

## 2012-01-16 DIAGNOSIS — M5416 Radiculopathy, lumbar region: Secondary | ICD-10-CM

## 2012-01-16 DIAGNOSIS — R29898 Other symptoms and signs involving the musculoskeletal system: Secondary | ICD-10-CM | POA: Insufficient documentation

## 2012-01-16 DIAGNOSIS — N39498 Other specified urinary incontinence: Secondary | ICD-10-CM | POA: Insufficient documentation

## 2012-01-16 DIAGNOSIS — H9190 Unspecified hearing loss, unspecified ear: Secondary | ICD-10-CM | POA: Insufficient documentation

## 2012-01-16 DIAGNOSIS — M546 Pain in thoracic spine: Secondary | ICD-10-CM | POA: Insufficient documentation

## 2012-01-16 DIAGNOSIS — Z9889 Other specified postprocedural states: Secondary | ICD-10-CM | POA: Insufficient documentation

## 2012-01-16 DIAGNOSIS — M549 Dorsalgia, unspecified: Secondary | ICD-10-CM

## 2012-01-16 DIAGNOSIS — S20229A Contusion of unspecified back wall of thorax, initial encounter: Secondary | ICD-10-CM | POA: Insufficient documentation

## 2012-01-16 DIAGNOSIS — Z79899 Other long term (current) drug therapy: Secondary | ICD-10-CM | POA: Insufficient documentation

## 2012-01-16 DIAGNOSIS — X58XXXA Exposure to other specified factors, initial encounter: Secondary | ICD-10-CM | POA: Insufficient documentation

## 2012-01-16 DIAGNOSIS — IMO0002 Reserved for concepts with insufficient information to code with codable children: Secondary | ICD-10-CM | POA: Insufficient documentation

## 2012-01-16 DIAGNOSIS — S32009A Unspecified fracture of unspecified lumbar vertebra, initial encounter for closed fracture: Secondary | ICD-10-CM | POA: Insufficient documentation

## 2012-01-16 DIAGNOSIS — E119 Type 2 diabetes mellitus without complications: Secondary | ICD-10-CM | POA: Insufficient documentation

## 2012-01-16 DIAGNOSIS — S32000A Wedge compression fracture of unspecified lumbar vertebra, initial encounter for closed fracture: Secondary | ICD-10-CM

## 2012-01-16 DIAGNOSIS — E079 Disorder of thyroid, unspecified: Secondary | ICD-10-CM | POA: Insufficient documentation

## 2012-01-16 LAB — DIFFERENTIAL
Basophils Absolute: 0 10*3/uL (ref 0.0–0.1)
Eosinophils Absolute: 0.1 10*3/uL (ref 0.0–0.7)
Eosinophils Relative: 1 % (ref 0–5)
Lymphocytes Relative: 9 % — ABNORMAL LOW (ref 12–46)
Neutrophils Relative %: 80 % — ABNORMAL HIGH (ref 43–77)

## 2012-01-16 LAB — BASIC METABOLIC PANEL
BUN: 28 mg/dL — ABNORMAL HIGH (ref 6–23)
Creatinine, Ser: 0.93 mg/dL (ref 0.50–1.10)
GFR calc Af Amer: 60 mL/min — ABNORMAL LOW (ref >90)
GFR calc non Af Amer: 52 mL/min — ABNORMAL LOW (ref >90)

## 2012-01-16 LAB — CBC
MCH: 28.8 pg (ref 26.0–34.0)
MCV: 86.5 fL (ref 78.0–100.0)
Platelets: 295 10*3/uL (ref 150–400)
RDW: 14.1 % (ref 11.5–15.5)
WBC: 7.9 10*3/uL (ref 4.0–10.5)

## 2012-01-16 LAB — PROTIME-INR
INR: 1.08 (ref 0.00–1.49)
Prothrombin Time: 14.2 seconds (ref 11.6–15.2)

## 2012-01-16 MED ORDER — TRAMADOL HCL 50 MG PO TABS
50.0000 mg | ORAL_TABLET | Freq: Once | ORAL | Status: AC
  Administered 2012-01-16: 50 mg via ORAL
  Filled 2012-01-16: qty 1

## 2012-01-16 NOTE — Telephone Encounter (Signed)
Pt's daughter Dannial Monarch called and left a VM stating she wanted to speak with a PA.  I gave a Note to Beckey Downing

## 2012-01-16 NOTE — ED Notes (Signed)
Pt extremely HOH  .  POA DTR at bedside accompanying pt to MRI>  Pt able to move self up in bed without c/o pian at this time

## 2012-01-16 NOTE — ED Provider Notes (Signed)
Patient comes in today complaining of mid right back pain. Patient is partially deaf and some of the history is obtained through writing and with her daughter.  Patient does not sign. According to the daughter she began complaining of more pain in her back last night and was dragging her left leg. She also told her daughter that her left leg was numb She does complain her daughter today that the right leg is becoming numb. She has poor bladder control and is difficult to tell if she is losing control of her bladder but has not had any loss of control of her she was admitted on March 9 with back pain and on March 13 a bone scan showed a fractured vertebrae. On March 14 she had a vertebroplasty by Dr. Corliss Skains March 28 she had a recheck with radiology and MRI showed another fractured vertebrae and she had a repeat kyphoplasty on March 29. She is having great difficulty walking today.  Physical exam patient has some tenderness to the right of her midthoracic spine. She has a contusion in the lower spine. She has weak dorsiflexion of her left great toe but otherwise seems equal throughout in her movements in her lower extremities. Her sensation is intact in her lower extremities.  Plan moved back labs, MRI, and pain  Radiographic studies discussed with Dr. Sheldon Silvan  . The plan is to obtain thoracic and lumbar spine MRI without contrast. The patient is having numbness and weakness of the lower extremity that will require MRI regardless of plain radiographic studies  Hilario Quarry, MD 01/16/12 1755

## 2012-01-16 NOTE — ED Notes (Signed)
Lower back pain hx of back pain.  Denies any injuries, pain became worse last night.   Developed numbness in her lower legs also last night

## 2012-01-16 NOTE — Telephone Encounter (Signed)
Left Vm for Daughter to call back

## 2012-01-16 NOTE — ED Notes (Signed)
Family upset about waiting for patient to be moved.  Advised her that all tests were resulted and that we are awaiting EDP for further.

## 2012-01-16 NOTE — ED Notes (Signed)
Returned from MRI with c/o "hurting so bad"  Unable to rate pain.  WIll consult MD for pain meds

## 2012-01-17 NOTE — Discharge Instructions (Signed)
SEEK IMMEDIATE MEDICAL ATTENTION IF: New numbness, tingling, weakness, or problem with the use of your arms or legs.  Severe back pain not relieved with medications.  Change in bowel or bladder control.  Increasing pain in any areas of the body (such as chest or abdominal pain).  Shortness of breath, dizziness or fainting.  Nausea (feeling sick to your stomach), vomiting, fever, or sweats.  Use your walker for safety.

## 2012-01-17 NOTE — ED Provider Notes (Signed)
History     CSN: [id number]  Arrival date & time 01/16/12  1526   First MD Initiated Contact with Patient 01/16/12 1726      Chief Complaint  Patient presents with  . Back Pain    (Consider location/radiation/quality/duration/timing/severity/associated sxs/prior treatment) HPI This 78 year old female lives at home and has a walker at home, she has multiple known vertebral compression fractures and has had 2 procedures recently to her chronic back pain, her family brought her to the ED because now she has several days of intermittent weakness and numbness to the left leg as well as intermittent spells of severe back pain. At times she seems to have no back pain and no weakness or numbness. She is no change in bowel or bladder function. She does have chronic urinary frequency for years which is unchanged. She has no dysuria abdominal pain nausea chest pain or shortness of breath. She's had no fever. She's had no falls. Because she has had difficulty walking with her walker dragging her left leg due to weakness and numbness at times her family brings her for the possibility of admission or observation for potential placement if she is unable to walk safely at home. However now that she's been in the ED she denies any back pain denies any weakness or numbness in his walking back at her baseline function. The patient refuses admission/observation at this time. Past Medical History  Diagnosis Date  . Diabetes mellitus   . Thyroid disease     Past Surgical History  Procedure Date  . Cesarean section   . Abdominal hysterectomy     No family history on file.  History  Substance Use Topics  . Smoking status: Never Smoker   . Smokeless tobacco: Not on file  . Alcohol Use: No    OB History    Grav Para Term Preterm Abortions TAB SAB Ect Mult Living                  Review of Systems  Constitutional: Negative for fever.       10 Systems reviewed and are negative for acute change  except as noted in the HPI.  HENT: Negative for congestion.   Eyes: Negative for discharge and redness.  Respiratory: Negative for cough and shortness of breath.   Cardiovascular: Negative for chest pain.  Gastrointestinal: Negative for vomiting and abdominal pain.  Genitourinary: Negative for dysuria.  Musculoskeletal: Positive for back pain.  Skin: Negative for rash.  Neurological: Positive for weakness and numbness. Negative for syncope and headaches.  Psychiatric/Behavioral:       No behavior change.    Allergies  Aspirin  Home Medications   Current Outpatient Rx  Name Route Sig Dispense Refill  . VITAMIN D3 1000 UNITS PO TABS Oral Take 2 tablets (2,000 Units total) by mouth daily. 60 tablet 12  . HYDROCODONE-GUAIFENESIN PO Oral Take 5 mLs by mouth every 6 (six) hours as needed. For cough    . LEVOTHYROXINE SODIUM 50 MCG PO TABS Oral Take 50 mcg by mouth every morning.     Marland Kitchen LOSARTAN POTASSIUM 50 MG PO TABS Oral Take 50 mg by mouth every morning.     Marland Kitchen TRAMADOL HCL 50 MG PO TABS Oral Take 50 mg by mouth every 12 (twelve) hours.    . TRIAMTERENE-HCTZ 37.5-25 MG PO CAPS Oral Take 1 capsule by mouth every morning.       BP 146/90  Pulse 94  Temp(Src) 98.2 F (36.8 C) (  Oral)  Resp 18  SpO2 96%  Physical Exam  Nursing note and vitals reviewed. Constitutional:       Awake, alert, nontoxic appearance with baseline speech. The patient is deaf and speaks normally and communicates by reading.  HENT:  Head: Atraumatic.  Eyes: Pupils are equal, round, and reactive to light. Right eye exhibits no discharge. Left eye exhibits no discharge.  Neck: Neck supple.  Cardiovascular: Normal rate and regular rhythm.   No murmur heard. Pulmonary/Chest: Effort normal and breath sounds normal. No respiratory distress. She has no wheezes. She has no rales. She exhibits no tenderness.  Abdominal: Soft. Bowel sounds are normal. She exhibits no mass. There is no tenderness. There is no rebound.    Musculoskeletal: She exhibits no edema and no tenderness.       Thoracic back: She exhibits no tenderness.       Lumbar back: She exhibits no tenderness.       Bilateral lower extremities non tender without new rashes or color change, baseline ROM with intact DP pulses, CR<2 secs all digits bilaterally, sensation baseline light touch bilaterally for pt, DTR's symmetric and intact bilaterally KJ / AJ, motor symmetric bilateral 5 / 5 hip flexion, quadriceps, hamstrings, EHL, foot dorsiflexion, foot plantarflexion, gait somewhat antalgic but without apparent new ataxia. The back is currently nontender.  Neurological: She is alert.       Mental status baseline for patient.  Upper extremity motor strength and sensation intact and symmetric bilaterally.  Skin: No rash noted.  Psychiatric: She has a normal mood and affect.    ED Course  Procedures (including critical care time)  Labs Reviewed  CBC - Abnormal; Notable for the following:    Hemoglobin 11.7 (*)    HCT 35.1 (*)    All other components within normal limits  DIFFERENTIAL - Abnormal; Notable for the following:    Neutrophils Relative 80 (*)    Lymphocytes Relative 9 (*)    All other components within normal limits  BASIC METABOLIC PANEL - Abnormal; Notable for the following:    Glucose, Bld 113 (*)    BUN 28 (*)    GFR calc non Af Amer 52 (*)    GFR calc Af Amer 60 (*)    All other components within normal limits  PROTIME-INR  LAB REPORT - SCANNED   No results found.   1. Lumbar compression fracture   2. Acute back pain   3. Lumbar radiculopathy       MDM  Patient / Family / Caregiver informed of clinical course, understand medical decision-making process, and agree with plan.I doubt any other EMC precluding discharge at this time including, but not necessarily limited to the following:SBI, cauda equina.        Hurman Horn, MD 01/19/12 2224

## 2012-01-17 NOTE — ED Notes (Signed)
EDP speaking with family at this time.  

## 2012-06-29 ENCOUNTER — Encounter (HOSPITAL_BASED_OUTPATIENT_CLINIC_OR_DEPARTMENT_OTHER): Payer: Self-pay | Admitting: Emergency Medicine

## 2012-06-29 ENCOUNTER — Emergency Department (HOSPITAL_BASED_OUTPATIENT_CLINIC_OR_DEPARTMENT_OTHER)
Admission: EM | Admit: 2012-06-29 | Discharge: 2012-06-29 | Disposition: A | Discharge status: Home / Self Care | Payer: Medicare Other | Attending: Emergency Medicine | Admitting: Emergency Medicine

## 2012-06-29 DIAGNOSIS — R040 Epistaxis: Secondary | ICD-10-CM

## 2012-06-29 DIAGNOSIS — I10 Essential (primary) hypertension: Secondary | ICD-10-CM | POA: Insufficient documentation

## 2012-06-29 DIAGNOSIS — Z888 Allergy status to other drugs, medicaments and biological substances status: Secondary | ICD-10-CM | POA: Insufficient documentation

## 2012-06-29 HISTORY — DX: Essential (primary) hypertension: I10

## 2012-06-29 LAB — CBC WITH DIFFERENTIAL/PLATELET
Basophils Absolute: 0 10*3/uL (ref 0.0–0.1)
Basophils Relative: 1 % (ref 0–1)
MCHC: 33.9 g/dL (ref 30.0–36.0)
Neutro Abs: 4.6 10*3/uL (ref 1.7–7.7)
Neutrophils Relative %: 72 % (ref 43–77)
Platelets: 210 10*3/uL (ref 150–400)
RDW: 15.3 % (ref 11.5–15.5)

## 2012-06-29 LAB — PROTIME-INR
INR: 1 (ref 0.00–1.49)
Prothrombin Time: 13.4 seconds (ref 11.6–15.2)

## 2012-06-29 LAB — APTT: aPTT: 29 seconds (ref 24–37)

## 2012-06-29 MED ORDER — PHENYLEPHRINE HCL 0.5 % NA SOLN
NASAL | Status: AC
  Administered 2012-06-29: 10:00:00 via NASAL
  Filled 2012-06-29: qty 15

## 2012-06-29 NOTE — ED Notes (Signed)
Daughter states pt has had several nosebleeds this past week- today started at 7:25 and would not stop.

## 2012-06-29 NOTE — ED Provider Notes (Signed)
History     CSN: [id number]  Arrival date & time 06/29/12  7829   First MD Initiated Contact with Patient 06/29/12 0902      Chief Complaint  Patient presents with  . Epistaxis    (Consider location/radiation/quality/duration/timing/severity/associated sxs/prior treatment) HPI Comments: Patient with nosebleeds off and on for the past two days.  Started this morning and would not stop.  No injury or trauma.  Not on anticoagulants.    Patient is a 78 y.o. female presenting with nosebleeds. The history is provided by the patient.  Epistaxis  This is a new problem. The current episode started 2 days ago. Episode frequency: intermittently. The problem has been gradually worsening. The problem is associated with an unknown factor. The bleeding has been from the left nare. She has tried nothing for the symptoms.    Past Medical History  Diagnosis Date  . Thyroid disease   . Hypertension     Past Surgical History  Procedure Date  . Cesarean section   . Abdominal hysterectomy     No family history on file.  History  Substance Use Topics  . Smoking status: Never Smoker   . Smokeless tobacco: Not on file  . Alcohol Use: No    OB History    Grav Para Term Preterm Abortions TAB SAB Ect Mult Living                  Review of Systems  HENT: Positive for nosebleeds.   All other systems reviewed and are negative.    Allergies  Aspirin  Home Medications   Current Outpatient Rx  Name Route Sig Dispense Refill  . VITAMIN D3 1000 UNITS PO TABS Oral Take 2 tablets (2,000 Units total) by mouth daily. 60 tablet 12  . HYDROCODONE-GUAIFENESIN PO Oral Take 5 mLs by mouth every 6 (six) hours as needed. For cough    . LEVOTHYROXINE SODIUM 50 MCG PO TABS Oral Take 50 mcg by mouth every morning.     Marland Kitchen LOSARTAN POTASSIUM 50 MG PO TABS Oral Take 50 mg by mouth every morning.     Marland Kitchen TRAMADOL HCL 50 MG PO TABS Oral Take 50 mg by mouth every 12 (twelve) hours.    . TRIAMTERENE-HCTZ  37.5-25 MG PO CAPS Oral Take 1 capsule by mouth every morning.       BP 176/80  Pulse 98  Temp 98.2 F (36.8 C) (Oral)  Physical Exam  Nursing note and vitals reviewed. Constitutional: She is oriented to person, place, and time. She appears well-developed and well-nourished.  HENT:  Head: Normocephalic and atraumatic.       There is blood in the left nare, but no active bleeding.  Septum is midline.    Neck: Normal range of motion. Neck supple.  Neurological: She is alert and oriented to person, place, and time.  Skin: Skin is warm and dry.    ED Course  Procedures (including critical care time)   Labs Reviewed  CBC WITH DIFFERENTIAL  APTT  PROTIME-INR   No results found.   No diagnosis found.    MDM  Nasal packing with neosynephrine and vaseline gauze with good results.  The coags and cbc are unremarkable.  The platelet count and Hb are in the normal range.  Will discharge to home, to return in 24 hours for packing removal.        Geoffery Lyons, MD 06/29/12 1032

## 2012-06-30 ENCOUNTER — Encounter (HOSPITAL_BASED_OUTPATIENT_CLINIC_OR_DEPARTMENT_OTHER): Payer: Self-pay | Admitting: *Deleted

## 2012-06-30 ENCOUNTER — Emergency Department (HOSPITAL_BASED_OUTPATIENT_CLINIC_OR_DEPARTMENT_OTHER)
Admission: EM | Admit: 2012-06-30 | Discharge: 2012-06-30 | Disposition: A | Discharge status: Home / Self Care | Payer: Medicare Other | Attending: Emergency Medicine | Admitting: Emergency Medicine

## 2012-06-30 DIAGNOSIS — Z4800 Encounter for change or removal of nonsurgical wound dressing: Secondary | ICD-10-CM

## 2012-06-30 DIAGNOSIS — Z888 Allergy status to other drugs, medicaments and biological substances status: Secondary | ICD-10-CM | POA: Insufficient documentation

## 2012-06-30 DIAGNOSIS — I10 Essential (primary) hypertension: Secondary | ICD-10-CM | POA: Insufficient documentation

## 2012-06-30 NOTE — ED Provider Notes (Signed)
History     CSN: [id number]  Arrival date & time 06/30/12  4098   First MD Initiated Contact with Patient 06/30/12 [phone]      Chief Complaint  Patient presents with  . Follow-up    (Consider location/radiation/quality/duration/timing/severity/associated sxs/prior treatment) HPI Pt presents for follow up and removal of nasal packing after being placed yesterday for nosebleed.  No further bleeding noted.  No fever, no pain.  There are no other associated systemic symptoms, there are no other alleviating or modifying factors.  Past Medical History  Diagnosis Date  . Thyroid disease   . Hypertension     Past Surgical History  Procedure Date  . Cesarean section   . Abdominal hysterectomy     History reviewed. No pertinent family history.  History  Substance Use Topics  . Smoking status: Never Smoker   . Smokeless tobacco: Not on file  . Alcohol Use: No    OB History    Grav Para Term Preterm Abortions TAB SAB Ect Mult Living                  Review of Systems ROS reviewed and all otherwise negative except for mentioned in HPI  Allergies  Aspirin  Home Medications   Current Outpatient Rx  Name Route Sig Dispense Refill  . VITAMIN D3 1000 UNITS PO TABS Oral Take 2 tablets (2,000 Units total) by mouth daily. 60 tablet 12  . HYDROCODONE-GUAIFENESIN PO Oral Take 5 mLs by mouth every 6 (six) hours as needed. For cough    . LEVOTHYROXINE SODIUM 50 MCG PO TABS Oral Take 50 mcg by mouth every morning.     Marland Kitchen LOSARTAN POTASSIUM 50 MG PO TABS Oral Take 50 mg by mouth every morning.     Marland Kitchen TRAMADOL HCL 50 MG PO TABS Oral Take 50 mg by mouth every 12 (twelve) hours.    . TRIAMTERENE-HCTZ 37.5-25 MG PO CAPS Oral Take 1 capsule by mouth every morning.       BP 164/69  Pulse 83  Temp 97.6 F (36.4 C) (Oral)  Resp 16  SpO2 98% Vitals reviewed Physical Exam Physical Examination: General appearance - alert, well appearing, and in no distress Mental status - alert,  oriented to person, place, and time Eyes - pupils equal and reactive, no conjunctival injection Nose - normal and patent, no erythema, discharge or polyps, no bleeding after packing removal from left nare Mouth - mucous membranes moist, pharynx normal without lesions Skin - normal coloration and turgor, no rashes  ED Course  Procedures (including critical care time)  Labs Reviewed - No data to display No results found.   1. Change or removal of wound packing       MDM  Pt presents for removal of nasal packing from left nare after being placed for 24 hours. No further bleeding after packing removed.  Discharged with strict return precautions.  Pt agreeable with plan.        Ethelda Chick, MD 06/30/12 [phone]

## 2012-06-30 NOTE — ED Notes (Signed)
Pt to room 7 in w/c, assisted to bed x 1 moderate assist. Pt is able to wb, pivot and turn. Pt is awake and alert, states she was told to return today for removal of her nasal packing placed here yesterday for epitaxis.

## 2014-06-09 ENCOUNTER — Other Ambulatory Visit: Payer: Self-pay | Admitting: Orthopedic Surgery

## 2014-06-09 DIAGNOSIS — M545 Low back pain, unspecified: Secondary | ICD-10-CM

## 2014-06-10 ENCOUNTER — Inpatient Hospital Stay (HOSPITAL_COMMUNITY): Payer: Medicare Other

## 2014-06-10 ENCOUNTER — Other Ambulatory Visit: Payer: Medicare Other

## 2014-06-10 ENCOUNTER — Inpatient Hospital Stay (HOSPITAL_BASED_OUTPATIENT_CLINIC_OR_DEPARTMENT_OTHER)
Admission: EM | Admit: 2014-06-10 | Discharge: 2014-06-15 | DRG: 481 | Disposition: A | Discharge status: Skilled Nursing Facility | Payer: Medicare Other | Attending: Internal Medicine | Admitting: Internal Medicine

## 2014-06-10 ENCOUNTER — Encounter (HOSPITAL_BASED_OUTPATIENT_CLINIC_OR_DEPARTMENT_OTHER): Payer: Self-pay | Admitting: Emergency Medicine

## 2014-06-10 ENCOUNTER — Emergency Department (HOSPITAL_BASED_OUTPATIENT_CLINIC_OR_DEPARTMENT_OTHER): Payer: Medicare Other

## 2014-06-10 DIAGNOSIS — M549 Dorsalgia, unspecified: Secondary | ICD-10-CM

## 2014-06-10 DIAGNOSIS — I252 Old myocardial infarction: Secondary | ICD-10-CM

## 2014-06-10 DIAGNOSIS — H9190 Unspecified hearing loss, unspecified ear: Secondary | ICD-10-CM | POA: Diagnosis present

## 2014-06-10 DIAGNOSIS — N390 Urinary tract infection, site not specified: Secondary | ICD-10-CM

## 2014-06-10 DIAGNOSIS — Z803 Family history of malignant neoplasm of breast: Secondary | ICD-10-CM

## 2014-06-10 DIAGNOSIS — I10 Essential (primary) hypertension: Secondary | ICD-10-CM | POA: Diagnosis present

## 2014-06-10 DIAGNOSIS — Z801 Family history of malignant neoplasm of trachea, bronchus and lung: Secondary | ICD-10-CM

## 2014-06-10 DIAGNOSIS — K922 Gastrointestinal hemorrhage, unspecified: Secondary | ICD-10-CM | POA: Diagnosis present

## 2014-06-10 DIAGNOSIS — N3000 Acute cystitis without hematuria: Secondary | ICD-10-CM

## 2014-06-10 DIAGNOSIS — E876 Hypokalemia: Secondary | ICD-10-CM

## 2014-06-10 DIAGNOSIS — Z66 Do not resuscitate: Secondary | ICD-10-CM | POA: Diagnosis present

## 2014-06-10 DIAGNOSIS — G8929 Other chronic pain: Secondary | ICD-10-CM | POA: Diagnosis present

## 2014-06-10 DIAGNOSIS — E039 Hypothyroidism, unspecified: Secondary | ICD-10-CM | POA: Diagnosis present

## 2014-06-10 DIAGNOSIS — D649 Anemia, unspecified: Secondary | ICD-10-CM

## 2014-06-10 DIAGNOSIS — Z886 Allergy status to analgesic agent status: Secondary | ICD-10-CM

## 2014-06-10 DIAGNOSIS — D509 Iron deficiency anemia, unspecified: Secondary | ICD-10-CM | POA: Diagnosis present

## 2014-06-10 DIAGNOSIS — M84453A Pathological fracture, unspecified femur, initial encounter for fracture: Secondary | ICD-10-CM | POA: Diagnosis present

## 2014-06-10 DIAGNOSIS — E079 Disorder of thyroid, unspecified: Secondary | ICD-10-CM | POA: Diagnosis present

## 2014-06-10 DIAGNOSIS — I4891 Unspecified atrial fibrillation: Secondary | ICD-10-CM

## 2014-06-10 DIAGNOSIS — S72141A Displaced intertrochanteric fracture of right femur, initial encounter for closed fracture: Secondary | ICD-10-CM

## 2014-06-10 DIAGNOSIS — M545 Low back pain: Secondary | ICD-10-CM

## 2014-06-10 DIAGNOSIS — H548 Legal blindness, as defined in USA: Secondary | ICD-10-CM | POA: Diagnosis present

## 2014-06-10 DIAGNOSIS — I480 Paroxysmal atrial fibrillation: Secondary | ICD-10-CM

## 2014-06-10 DIAGNOSIS — M79609 Pain in unspecified limb: Secondary | ICD-10-CM | POA: Diagnosis present

## 2014-06-10 DIAGNOSIS — R079 Chest pain, unspecified: Secondary | ICD-10-CM

## 2014-06-10 DIAGNOSIS — Z87898 Personal history of other specified conditions: Secondary | ICD-10-CM

## 2014-06-10 HISTORY — DX: Unspecified hearing loss, unspecified ear: H91.90

## 2014-06-10 HISTORY — DX: Unspecified visual loss: H54.7

## 2014-06-10 LAB — CBC WITH DIFFERENTIAL/PLATELET
BASOS PCT: 0 % (ref 0–1)
Basophils Absolute: 0 10*3/uL (ref 0.0–0.1)
EOS PCT: 1 % (ref 0–5)
Eosinophils Absolute: 0.1 10*3/uL (ref 0.0–0.7)
HCT: 21 % — ABNORMAL LOW (ref 36.0–46.0)
HEMOGLOBIN: 6.4 g/dL — AB (ref 12.0–15.0)
LYMPHS ABS: 0.6 10*3/uL — AB (ref 0.7–4.0)
Lymphocytes Relative: 7 % — ABNORMAL LOW (ref 12–46)
MCH: 19.9 pg — AB (ref 26.0–34.0)
MCHC: 30.5 g/dL (ref 30.0–36.0)
MCV: 65.2 fL — AB (ref 78.0–100.0)
MONO ABS: 1 10*3/uL (ref 0.1–1.0)
MONOS PCT: 11 % (ref 3–12)
Neutro Abs: 7.5 10*3/uL (ref 1.7–7.7)
Neutrophils Relative %: 81 % — ABNORMAL HIGH (ref 43–77)
PLATELETS: 405 10*3/uL — AB (ref 150–400)
RBC: 3.22 MIL/uL — AB (ref 3.87–5.11)
RDW: 16.3 % — ABNORMAL HIGH (ref 11.5–15.5)
WBC: 9.2 10*3/uL (ref 4.0–10.5)

## 2014-06-10 LAB — BASIC METABOLIC PANEL
ANION GAP: 12 (ref 5–15)
BUN: 22 mg/dL (ref 6–23)
CALCIUM: 10.1 mg/dL (ref 8.4–10.5)
CHLORIDE: 100 meq/L (ref 96–112)
CO2: 27 meq/L (ref 19–32)
Creatinine, Ser: 0.9 mg/dL (ref 0.50–1.10)
GFR calc Af Amer: 62 mL/min — ABNORMAL LOW (ref >90)
GFR calc non Af Amer: 53 mL/min — ABNORMAL LOW (ref >90)
GLUCOSE: 132 mg/dL — AB (ref 70–99)
Potassium: 3.5 mEq/L — ABNORMAL LOW (ref 3.7–5.3)
SODIUM: 139 meq/L (ref 137–147)

## 2014-06-10 LAB — URINE MICROSCOPIC-ADD ON

## 2014-06-10 LAB — TROPONIN I

## 2014-06-10 LAB — HEPATIC FUNCTION PANEL
ALBUMIN: 3.1 g/dL — AB (ref 3.5–5.2)
ALK PHOS: 93 U/L (ref 39–117)
ALT: 12 U/L (ref 0–35)
AST: 10 U/L (ref 0–37)
BILIRUBIN TOTAL: 0.3 mg/dL (ref 0.3–1.2)
Bilirubin, Direct: <0.2 mg/dL (ref 0.0–0.3)
Total Protein: 5.7 g/dL — ABNORMAL LOW (ref 6.0–8.3)

## 2014-06-10 LAB — URINALYSIS, ROUTINE W REFLEX MICROSCOPIC
Bilirubin Urine: NEGATIVE
GLUCOSE, UA: NEGATIVE mg/dL
Ketones, ur: NEGATIVE mg/dL
NITRITE: NEGATIVE
PH: 7.5 (ref 5.0–8.0)
Protein, ur: NEGATIVE mg/dL
SPECIFIC GRAVITY, URINE: 1.005 (ref 1.005–1.030)
Urobilinogen, UA: 0.2 mg/dL (ref 0.0–1.0)

## 2014-06-10 LAB — PREPARE RBC (CROSSMATCH)

## 2014-06-10 LAB — GLUCOSE, CAPILLARY: GLUCOSE-CAPILLARY: 164 mg/dL — AB (ref 70–99)

## 2014-06-10 LAB — MRSA PCR SCREENING: MRSA by PCR: NEGATIVE

## 2014-06-10 LAB — RETICULOCYTES
RBC.: 3.33 MIL/uL — ABNORMAL LOW (ref 3.87–5.11)
Retic Count, Absolute: 43.3 10*3/uL (ref 19.0–186.0)
Retic Ct Pct: 1.3 % (ref 0.4–3.1)

## 2014-06-10 LAB — PROTIME-INR
INR: 1.11 (ref 0.00–1.49)
Prothrombin Time: 14.3 seconds (ref 11.6–15.2)

## 2014-06-10 LAB — TECHNOLOGIST SMEAR REVIEW

## 2014-06-10 LAB — OCCULT BLOOD X 1 CARD TO LAB, STOOL: Fecal Occult Bld: NEGATIVE

## 2014-06-10 LAB — TSH: TSH: 1.62 u[IU]/mL (ref 0.350–4.500)

## 2014-06-10 MED ORDER — MORPHINE SULFATE 2 MG/ML IJ SOLN
1.0000 mg | INTRAMUSCULAR | Status: AC | PRN
  Administered 2014-06-10 – 2014-06-11 (×3): 2 mg via INTRAVENOUS
  Filled 2014-06-10 (×2): qty 1

## 2014-06-10 MED ORDER — POTASSIUM CHLORIDE CRYS ER 20 MEQ PO TBCR
40.0000 meq | EXTENDED_RELEASE_TABLET | Freq: Once | ORAL | Status: AC
  Administered 2014-06-10: 40 meq via ORAL
  Filled 2014-06-10: qty 2

## 2014-06-10 MED ORDER — SODIUM CHLORIDE 0.9 % IV SOLN
Freq: Once | INTRAVENOUS | Status: AC
  Administered 2014-06-10: 500 mL via INTRAVENOUS

## 2014-06-10 MED ORDER — METHOCARBAMOL 1000 MG/10ML IJ SOLN
500.0000 mg | Freq: Three times a day (TID) | INTRAVENOUS | Status: DC | PRN
  Filled 2014-06-10: qty 5

## 2014-06-10 MED ORDER — ONDANSETRON HCL 4 MG PO TABS: 4.0000 mg | ORAL_TABLET | Freq: Four times a day (QID) | ORAL | Status: DC | PRN

## 2014-06-10 MED ORDER — OXYCODONE-ACETAMINOPHEN 5-325 MG PO TABS
1.0000 | ORAL_TABLET | ORAL | Status: DC | PRN
  Administered 2014-06-10 – 2014-06-15 (×12): 1 via ORAL
  Filled 2014-06-10 (×13): qty 1

## 2014-06-10 MED ORDER — ONDANSETRON HCL 4 MG/2ML IJ SOLN: 4.0000 mg | Freq: Four times a day (QID) | INTRAMUSCULAR | Status: DC | PRN

## 2014-06-10 MED ORDER — PANTOPRAZOLE SODIUM 40 MG PO TBEC
40.0000 mg | DELAYED_RELEASE_TABLET | Freq: Every day | ORAL | Status: DC
  Administered 2014-06-11 – 2014-06-15 (×4): 40 mg via ORAL
  Filled 2014-06-10 (×4): qty 1

## 2014-06-10 MED ORDER — CEFTRIAXONE SODIUM 1 G IJ SOLR
INTRAMUSCULAR | Status: AC
  Filled 2014-06-10: qty 10

## 2014-06-10 MED ORDER — ACETAMINOPHEN 325 MG PO TABS: 650.0000 mg | ORAL_TABLET | Freq: Four times a day (QID) | ORAL | Status: DC | PRN

## 2014-06-10 MED ORDER — SODIUM CHLORIDE 0.9 % IJ SOLN
3.0000 mL | Freq: Two times a day (BID) | INTRAMUSCULAR | Status: DC
  Administered 2014-06-10 – 2014-06-13 (×6): 3 mL via INTRAVENOUS

## 2014-06-10 MED ORDER — SENNA 8.6 MG PO TABS
1.0000 | ORAL_TABLET | Freq: Two times a day (BID) | ORAL | Status: DC
  Administered 2014-06-10 – 2014-06-15 (×7): 8.6 mg via ORAL
  Filled 2014-06-10 (×12): qty 1

## 2014-06-10 MED ORDER — ACETAMINOPHEN 325 MG PO TABS
650.0000 mg | ORAL_TABLET | Freq: Once | ORAL | Status: AC
Start: 2014-06-10 — End: 2014-06-10
  Administered 2014-06-10: 650 mg via ORAL
  Filled 2014-06-10: qty 2

## 2014-06-10 MED ORDER — ACETAMINOPHEN 650 MG RE SUPP: 650.0000 mg | Freq: Four times a day (QID) | RECTAL | Status: DC | PRN

## 2014-06-10 MED ORDER — GABAPENTIN 100 MG PO CAPS
100.0000 mg | ORAL_CAPSULE | Freq: Every day | ORAL | Status: AC
  Administered 2014-06-10 – 2014-06-11 (×2): 100 mg via ORAL
  Filled 2014-06-10 (×4): qty 1

## 2014-06-10 MED ORDER — DIPHENHYDRAMINE HCL 25 MG PO CAPS
25.0000 mg | ORAL_CAPSULE | Freq: Once | ORAL | Status: AC
  Administered 2014-06-10: 25 mg via ORAL
  Filled 2014-06-10: qty 1

## 2014-06-10 MED ORDER — LEVOTHYROXINE SODIUM 50 MCG PO TABS
50.0000 ug | ORAL_TABLET | Freq: Every day | ORAL | Status: DC
  Administered 2014-06-11 – 2014-06-15 (×5): 50 ug via ORAL
  Filled 2014-06-10 (×7): qty 1

## 2014-06-10 MED ORDER — CEFTRIAXONE SODIUM 1 G IJ SOLR
1.0000 g | INTRAMUSCULAR | Status: DC
  Administered 2014-06-10 – 2014-06-14 (×5): 1 g via INTRAVENOUS
  Filled 2014-06-10 (×5): qty 10

## 2014-06-10 MED ORDER — DOCUSATE SODIUM 100 MG PO CAPS
100.0000 mg | ORAL_CAPSULE | Freq: Two times a day (BID) | ORAL | Status: DC
  Administered 2014-06-10 – 2014-06-15 (×7): 100 mg via ORAL
  Filled 2014-06-10 (×12): qty 1

## 2014-06-10 MED ORDER — MORPHINE SULFATE 2 MG/ML IJ SOLN
2.0000 mg | INTRAMUSCULAR | Status: DC | PRN
  Administered 2014-06-10 (×2): 2 mg via INTRAVENOUS
  Filled 2014-06-10 (×3): qty 1

## 2014-06-10 NOTE — Progress Notes (Signed)
Pt arrived from [facility]-family at bedside-oriented to unit (pt HOH and legally blind).

## 2014-06-10 NOTE — Progress Notes (Signed)
80 year old presented to [facility] for back pain and lower ext pain.  She was going to have an MRI which was ordered by her orthopedist, Dr. Turner Daniels.  Patient was unable to have the MRI conducted due to pain. She then went to med [facility].  Labs were drawn, patient was found to have anemia with hemoglobin of 6.4, fecal occult was negative. She was also found to have UTI and started on ceftriaxone. Patient is being transferred to [facility] for evaluation and treatment of her anemia. Blood pressure has been 90 systolically. Accepted the patient, Team 10, step down bed.

## 2014-06-10 NOTE — Progress Notes (Signed)
Admitting team paged, notified of pt location and arrival.

## 2014-06-10 NOTE — ED Notes (Signed)
MRI to be done in next 30 mins

## 2014-06-10 NOTE — H&P (Addendum)
PCP:  Charlette Caffey, MD    Chief Complaint:  Leg pain  HPI: [name] is a 80 y.o. female   has a past medical history of Thyroid disease; Hypertension; Cannot hear; and Vision loss.   Presented with  Patient have had right leg pain for a while from the thigh to the hip. She have had cortisone injections. She was supposed to have an MRI done but was in too much pain and was brought to [facility]. MRI done there showed spondylosis but no change from prior. IN ER she was found o  Have UTI and treated with rocephin. Her hg was noted to be down to 6.4. Patient has no hx of significant anemia. Although she was told before she may have some low blood counts.  Patient is legally blind but family states no hx of melena or blood in stools. Never had a colonoscopy.  Hemocult negative in ER. Family denies any SOB or chest pain. On arrival to [facility] she was reported noted in A.fib and was having some chest pain. She has spontaneously converted.   Regarding leg pain patient states it worse with ambulation and feels like stabbing pain. She has full range of motion in hip and knee.   Hospitalist was called for admission for symptomatic anemia  Review of Systems:    Pertinent positives include:  chest pain, night sweats, urination urgency and frequency.   Bilateral lower extremity swelling   Constitutional:  No weight loss,  Fevers, chills, fatigue, weight loss  HEENT:  No headaches, Difficulty swallowing,Tooth/dental problems,Sore throat,  No sneezing, itching, ear ache, nasal congestion, post nasal drip,  Cardio-vascular:  No  Orthopnea, PND, anasarca, dizziness, palpitations.no GI:  No heartburn, indigestion, abdominal pain, nausea, vomiting, diarrhea, change in bowel habits, loss of appetite, melena, blood in stool, hematemesis Resp:  no shortness of breath at rest. No dyspnea on exertion, No excess mucus, no productive cough, No non-productive cough, No coughing up of blood.No change in  color of mucus.No wheezing. Skin:  no rash or lesions. No jaundice GU:  no dysuria, change in color of urine, no  No straining to urinate.  No flank pain.  Musculoskeletal:  No joint pain or no joint swelling. No decreased range of motion. No back pain.  Psych:  No change in mood or affect. No depression or anxiety. No memory loss.  Neuro: no localizing neurological complaints, no tingling, no weakness, no double vision, no gait abnormality, no slurred speech, no confusion  Otherwise ROS are negative except for above, 10 systems were reviewed  Past Medical History: Past Medical History  Diagnosis Date  . Thyroid disease   . Hypertension   . Cannot hear   . Vision loss    Past Surgical History  Procedure Laterality Date  . Cesarean section    . Abdominal hysterectomy    . Kyphoplasty       Medications: Prior to Admission medications   Medication Sig Start Date End Date Taking? Authorizing Provider  acetaminophen-codeine (TYLENOL #3) 300-30 MG per tablet Take 1 tablet by mouth every 6 (six) hours as needed for moderate pain.   Yes Historical Provider, MD  cholecalciferol (VITAMIN D) 1000 UNITS tablet Take 3,000 Units by mouth daily.   Yes Historical Provider, MD  levothyroxine (SYNTHROID, LEVOTHROID) 50 MCG tablet Take 50 mcg by mouth every morning.    Yes Historical Provider, MD  losartan (COZAAR) 50 MG tablet Take 50 mg by mouth every morning.  Yes Historical Provider, MD  triamterene-hydrochlorothiazide (DYAZIDE) 37.5-25 MG per capsule Take 1 capsule by mouth every morning.    Yes Historical Provider, MD  HYDROCODONE-GUAIFENESIN PO Take 5 mLs by mouth every 6 (six) hours as needed. For cough    Historical Provider, MD  traMADol (ULTRAM) 50 MG tablet Take 50 mg by mouth every 12 (twelve) hours.    Historical Provider, MD    Allergies:   Allergies  Allergen Reactions  . Aspirin Other (See Comments)    Nose bleeds.     Social History:  Ambulatory   walker   Lives  at home alone,      reports that she has never smoked. She does not have any smokeless tobacco history on file. She reports that she does not drink alcohol or use illicit drugs.    Family History: family history includes Breast cancer in her sister; Cancer in her brother; Liver disease in her son; Lung cancer in her son.    Physical Exam: Patient Vitals for the past 24 hrs:  BP Temp Temp src Pulse Resp SpO2 Height Weight  06/10/14 2022 139/63 mmHg 97.5 F (36.4 C) Axillary 76 13 97 % 5' (1.524 m) 51 kg (112 lb 7 oz)  06/10/14 1850 162/82 mmHg - - 94 12 96 % - -  06/10/14 1750 110/36 mmHg - - 88 22 97 % - -  06/10/14 1527 134/67 mmHg - - 85 16 97 % - -  06/10/14 1300 114/41 mmHg - - 79 - 97 % - -  06/10/14 1134 108/39 mmHg - - 92 16 99 % - -  06/10/14 1122 101/37 mmHg - - - - - - -  06/10/14 1120 91/47 mmHg 98.2 F (36.8 C) Oral 96 20 95 % 5' (1.524 m) 53.071 kg (117 lb)    1. General:  in No Acute distress 2. Psychological: Alert and   Oriented 3. Head/ENT:   Moist   Mucous Membranes                          Head Non traumatic, neck supple                          Normal   Dentition 4. SKIN:  decreased Skin turgor,  Skin clean Dry, numerous areas of extensive sun damage noted.  5. Heart: Regular rate and rhythm no Murmur, Rub or gallop 6. Lungs:  no wheezes some basilar crackles   7. Abdomen: Soft, non-tender, Non distended 8. Lower extremities: no clubbing, cyanosis, or edema 9. Neurologically Grossly intact, moving all 4 extremities equally 10. MSK: Normal range of motion Hemoccult neg  body mass index is 21.96 kg/(m^2).   Labs on Admission:   Recent Labs  06/10/14 1300  NA 139  K 3.5*  CL 100  CO2 27  GLUCOSE 132*  BUN 22  CREATININE 0.90  CALCIUM 10.1   No results found for this basename: AST, ALT, ALKPHOS, BILITOT, PROT, ALBUMIN,  in the last 72 hours No results found for this basename: LIPASE, AMYLASE,  in the last 72 hours  Recent Labs  06/10/14 1300   WBC 9.2  NEUTROABS 7.5  HGB 6.4*  HCT 21.0*  MCV 65.2*  PLT 405*    Recent Labs  06/10/14 1300  TROPONINI <0.30   No results found for this basename: TSH, T4TOTAL, FREET3, T3FREE, THYROIDAB,  in the last 72 hours No results found  for this basename: VITAMINB12, FOLATE, FERRITIN, TIBC, IRON, RETICCTPCT,  in the last 72 hours No results found for this basename: HGBA1C    Estimated Creatinine Clearance: 28.1 ml/min (by C-G formula based on Cr of 0.9). ABG No results found for this basename: phart, pco2, po2, hco3, tco2, acidbasedef, o2sat     No results found for this basename: DDIMER     Other results:  I have pearsonaly reviewed this: ECG REPORT  Rate: 77  Rhythm: NSR ST&T Change: no ischemia   UA evidence of UTI  BNP (last 3 results) No results found for this basename: PROBNP,  in the last 8760 hours  Filed Weights   06/10/14 1120 06/10/14 2022  Weight: 53.071 kg (117 lb) 51 kg (112 lb 7 oz)     Cultures: No results found for this basename: sdes, specrequest, cult, reptstatus     Radiological Exams on Admission: Ct Abdomen Pelvis Wo Contrast  06/10/2014   CLINICAL DATA:  Right flank pain.  Nausea.  EXAM: CT ABDOMEN AND PELVIS WITHOUT CONTRAST  TECHNIQUE: Multidetector CT imaging of the abdomen and pelvis was performed following the standard protocol without IV contrast.  COMPARISON:  None.  FINDINGS: Mild cardiomegaly. Minimal dependent atelectasis or scarring in the lung bases. No effusions.  Mild fullness of the renal pelves bilaterally which could reflect extrarenal pelves or Mild chronic UPJ obstructions. Findings are similar to prior study. Large right lower pole renal cyst measures up to 7.5 cm. Multiple bilateral renal cysts present. No ureteral stones. Urinary bladder is unremarkable.  Liver, gallbladder, spleen, pancreas and adrenals have an unremarkable unenhanced appearance. Aorta and branch vessels are heavily calcified, non aneurysmal.  Sigmoid  diverticulosis. No active diverticulitis. Moderate stool throughout the colon. Small bowel and stomach are decompressed, grossly unremarkable.  Numerous compression fractures in the thoracolumbar spine. Multi level Prior vertebral augmentation. Findings similar to prior study.  IMPRESSION: Mild fullness of the renal pelves bilaterally with decompressed ureters. This is stable since 2013. This could reflect extrarenal pelves or mild chronic UPJ obstruction. No ureteral stones.  Bilateral renal cysts.  Sigmoid diverticulosis.  No active diverticulitis.  No acute findings in the abdomen or pelvis.   Electronically Signed   By: Charlett Nose M.D.   On: 06/10/2014 14:51   Mr Lumbar Spine Wo Contrast  06/10/2014   CLINICAL DATA:  80 year old with low back pain radiating into the left leg. History of vertebroplasty. Urinary incontinence.  EXAM: MRI LUMBAR SPINE WITHOUT CONTRAST  TECHNIQUE: Multiplanar, multisequence MR imaging of the lumbar spine was performed. No intravenous contrast was administered.  COMPARISON:  MRI 01/16/2012.  Abdominal pelvic CT 06/10/2014.  FINDINGS: The lumbar alignment is stable and near anatomic. Multiple chronic thoracolumbar compression deformities are again noted. The patient has undergone previous spinal augmentation at T11, T12 and L1. Fractures at those levels have healed. There are chronic superior endplate compression deformities at L2 and L3 and inferior endplate compression deformities at L4 and L5. There is no evidence of acute fracture.  The conus medullaris extends to the L1-2 level and appears normal. No paraspinal abnormalities are identified.Multiple renal cysts are present bilaterally.  L1-2: Mild disc bulging. No significant spinal stenosis or nerve root encroachment.  L2-3: Annular disc bulging eccentric to the left with mild facet and ligamentous hypertrophy. There is mild narrowing of the lateral recesses. The foramina are patent.  L3-4: Mild disc bulging, facet and  ligamentous hypertrophy. No significant spinal stenosis or nerve root encroachment.  L4-5: Disc bulging  and osteophytes are asymmetric to the right. There is mild facet and ligamentous hypertrophy. There is mild narrowing of the lateral recesses. The foramina are patent.  L5-S1: There are moderate facet degenerative changes with mild disc bulging eccentric to the right. There is mild inferior foraminal narrowing on the right without definite nerve root encroachment.  IMPRESSION: 1. Multiple healed thoracolumbar compression deformities status post spinal augmentation. No acute osseous findings. 2. Generally stable multilevel spondylosis. No high-grade spinal stenosis or nerve root encroachment demonstrated.   Electronically Signed   By: Roxy Horseman M.D.   On: 06/10/2014 17:21    Chart has been reviewed  Assessment/Plan  80 yo F with hx of HTN here with Anemia and UTI and was found to have transient episode of A.fib associated with chest pain, has hx of chronic leg pain unclear etiology  Present on Admission:  .  Symptomatic anemia - obtain anemia panel, transfuse 2 units, blood smear positive for stomatocytes no hx of ETOH. Will need to repeat, hemoccult stool x3 to watch for slow bleed . Urinary tract infectious disease - treat with rocephin await result of urine culture . Hypertension - hold PO meds given recent hypotension . Hypokalemia - will replace. Mg level . Chest pain - in the setting of anemia and a. Fib - will cycle CE, echo in AM given crackles and hx of peripheral edema . Paroxysmal a-fib - given anemia of unclear etiology possibly iron deficiency with possible Gi source will hold off on anticoagulation back to sinus rhythm now.  . Pain in joint, lower leg - this is chronic. MRI did not show nerve compression, given shooting type of pain will see if some improvement with neurontin can titrate up as tolerated.    Prophylaxis: SCD  , Protonix  CODE STATUS:  DNR/DNI  as per patient's  wishes, After discussion with the family and patient she stated that she did not wish for resuscitation Family at bedside during this conversation.    Other plan as per orders.  I have spent a total of 55 min on this admission  Clarise Chacko 06/10/2014, 8:56 PM  [facility]  Pager [phone]   If 7AM-7PM, please contact the day team taking care of the patient  [email or web address]  Password [email or web address]

## 2014-06-10 NOTE — ED Notes (Signed)
Pt. To ED w/ c/o  BLE edema and pain, esp RLE.

## 2014-06-10 NOTE — ED Notes (Signed)
Patient complaint new chest pain, RN aware, EKG done, placed on monitor.

## 2014-06-10 NOTE — ED Provider Notes (Addendum)
CSN: [id number]     Arrival date & time 06/10/14  1115 History   First MD Initiated Contact with Patient 06/10/14 1204     Chief Complaint  Patient presents with  . Leg Swelling   History is obtained primarily through the daughter.  Patient is almost deaf at this point in her eyesight is very poor HPI Pt is complaining of increasing leg and pain swelling that brought her to the ED.  She has had the leg pain for a long period of time at least several weeks.  She has seen an orthopedist and has had injection in the groin and knee. The patient was actually scheduled to have an outpatient MRI today. She was too uncomfortable to do so. The patient's daughter brought her to the emergency department for evaluation Walking increases the pain.  Today the pain was more severe and she was not able to walk. She has been taking T3 without relief.  No fever.  No chest pain or shortness. The blood pressure was taken this morning and it was lower than normal. Past Medical History  Diagnosis Date  . Thyroid disease   . Hypertension    Past Surgical History  Procedure Laterality Date  . Cesarean section    . Abdominal hysterectomy    . Kyphoplasty     No family history on file. History  Substance Use Topics  . Smoking status: Never Smoker   . Smokeless tobacco: Not on file  . Alcohol Use: No   OB History   Grav Para Term Preterm Abortions TAB SAB Ect Mult Living                 Review of Systems  All other systems reviewed and are negative.     Allergies  Aspirin  Home Medications   Prior to Admission medications   Medication Sig Start Date End Date Taking? Authorizing Provider  acetaminophen-codeine (TYLENOL #3) 300-30 MG per tablet Take 1 tablet by mouth every 6 (six) hours as needed for moderate pain.   Yes Historical Provider, MD  HYDROCODONE-GUAIFENESIN PO Take 5 mLs by mouth every 6 (six) hours as needed. For cough    Historical Provider, MD  levothyroxine (SYNTHROID, LEVOTHROID)  50 MCG tablet Take 50 mcg by mouth every morning.     Historical Provider, MD  losartan (COZAAR) 50 MG tablet Take 50 mg by mouth every morning.     Historical Provider, MD  traMADol (ULTRAM) 50 MG tablet Take 50 mg by mouth every 12 (twelve) hours.    Historical Provider, MD  triamterene-hydrochlorothiazide (DYAZIDE) 37.5-25 MG per capsule Take 1 capsule by mouth every morning.     Historical Provider, MD   BP 108/39  Pulse 92  Temp(Src) 98.2 F (36.8 C) (Oral)  Resp 16  Ht 5' (1.524 m)  Wt 117 lb (53.071 kg)  BMI 22.85 kg/m2  SpO2 99% Physical Exam  Nursing note and vitals reviewed. Constitutional: She appears distressed.  Elderly, frail   HENT:  Head: Normocephalic and atraumatic.  Right Ear: External ear normal.  Left Ear: External ear normal.  Eyes: Conjunctivae are normal. Right eye exhibits no discharge. Left eye exhibits no discharge. No scleral icterus.  Neck: Neck supple. No tracheal deviation present.  Cardiovascular: Normal rate, regular rhythm and intact distal pulses.   Pulmonary/Chest: Effort normal and breath sounds normal. No stridor. No respiratory distress. She has no wheezes. She has no rales.  Abdominal: Soft. Bowel sounds are normal. She exhibits no distension.  There is no tenderness. There is no rebound and no guarding.  Musculoskeletal: She exhibits edema and tenderness.       Left hip: She exhibits decreased range of motion. She exhibits no tenderness, no swelling and no deformity.       Lumbar back: She exhibits no bony tenderness, no swelling and no deformity.  No erythema bilateral lower extrem, mild edema in feet   Neurological: She is alert. She has normal strength. No cranial nerve deficit (no facial droop, extraocular movements intact, no slurred speech) or sensory deficit. She exhibits normal muscle tone. She displays no seizure activity. Coordination normal.  Skin: Skin is warm and dry. No rash noted.  Psychiatric: She has a normal mood and affect.     ED Course  Procedures (including critical care time) CRITICAL CARE Performed by: [email or web address],UEA Total critical care time: 35 Critical care time was exclusive of separately billable procedures and treating other patients. Critical care was necessary to treat or prevent imminent or life-threatening deterioration. Critical care was time spent personally by me on the following activities: development of treatment plan with patient and/or surrogate as well as nursing, discussions with consultants, evaluation of patient's response to treatment, examination of patient, obtaining history from patient or surrogate, ordering and performing treatments and interventions, ordering and review of laboratory studies, ordering and review of radiographic studies, pulse oximetry and re-evaluation of patient's condition.   Labs Review Labs Reviewed  CBC WITH DIFFERENTIAL - Abnormal; Notable for the following:    RBC 3.22 (*)    Hemoglobin 6.4 (*)    HCT 21.0 (*)    MCV 65.2 (*)    MCH 19.9 (*)    RDW 16.3 (*)    Platelets 405 (*)    Neutrophils Relative % 81 (*)    Lymphocytes Relative 7 (*)    Lymphs Abs 0.6 (*)    All other components within normal limits  BASIC METABOLIC PANEL - Abnormal; Notable for the following:    Potassium 3.5 (*)    Glucose, Bld 132 (*)    GFR calc non Af Amer 53 (*)    GFR calc Af Amer 62 (*)    All other components within normal limits  URINALYSIS, ROUTINE W REFLEX MICROSCOPIC - Abnormal; Notable for the following:    APPearance CLOUDY (*)    Hgb urine dipstick TRACE (*)    Leukocytes, UA LARGE (*)    All other components within normal limits  URINE MICROSCOPIC-ADD ON - Abnormal; Notable for the following:    Bacteria, UA MANY (*)    All other components within normal limits  TROPONIN I  OCCULT BLOOD X 1 CARD TO LAB, STOOL    Imaging Review Ct Abdomen Pelvis Wo Contrast  06/10/2014   CLINICAL DATA:  Right flank pain.  Nausea.  EXAM: CT ABDOMEN AND PELVIS WITHOUT  CONTRAST  TECHNIQUE: Multidetector CT imaging of the abdomen and pelvis was performed following the standard protocol without IV contrast.  COMPARISON:  None.  FINDINGS: Mild cardiomegaly. Minimal dependent atelectasis or scarring in the lung bases. No effusions.  Mild fullness of the renal pelves bilaterally which could reflect extrarenal pelves or Mild chronic UPJ obstructions. Findings are similar to prior study. Large right lower pole renal cyst measures up to 7.5 cm. Multiple bilateral renal cysts present. No ureteral stones. Urinary bladder is unremarkable.  Liver, gallbladder, spleen, pancreas and adrenals have an unremarkable unenhanced appearance. Aorta and branch vessels are heavily calcified, non aneurysmal.  Sigmoid diverticulosis.  No active diverticulitis. Moderate stool throughout the colon. Small bowel and stomach are decompressed, grossly unremarkable.  Numerous compression fractures in the thoracolumbar spine. Multi level Prior vertebral augmentation. Findings similar to prior study.  IMPRESSION: Mild fullness of the renal pelves bilaterally with decompressed ureters. This is stable since 2013. This could reflect extrarenal pelves or mild chronic UPJ obstruction. No ureteral stones.  Bilateral renal cysts.  Sigmoid diverticulosis.  No active diverticulitis.  No acute findings in the abdomen or pelvis.   Electronically Signed   By: Charlett Nose M.D.   On: 06/10/2014 14:51     EKG Interpretation   Date/Time:  Saturday June 10 2014 13:12:33 EDT Ventricular Rate:  77 PR Interval:  176 QRS Duration: 108 QT Interval:  366 QTC Calculation: 414 R Axis:   46 Text Interpretation:  Normal sinus rhythm Normal ECG No old tracing to  compare Confirmed by Elois Averitt  MD-J, Hiawatha Dressel ([id number]) on 06/10/2014 1:13:56 PM     Medications  morphine 2 MG/ML injection 2 mg (2 mg Intravenous Given 06/10/14 1401)  cefTRIAXone (ROCEPHIN) 1 g in dextrose 5 % 50 mL IVPB (1 g Intravenous New Bag/Given 06/10/14 1553)   cefTRIAXone (ROCEPHIN) 1 G injection (  Duplicate 06/10/14 1554)    MDM   Final diagnoses:  Anemia, unspecified anemia type  Urinary tract infection without hematuria, site unspecified  Low back pain, unspecified back pain laterality, with sciatica presence unspecified    Patient's laboratory tests show a significant anemia. Her hemoglobin is 6.4. This is significantly lower than prior labs. Her Hemoccult is negative. CT abdomen and pelvis was performed to rule out any sort of retroperitoneal bleed or hemorrhage. CT scan was unremarkable.  Patient does have a urinary tract infection. IV antibiotics were started.  We were Going to get the MRI will the patient was here in the emergency department the daughter is not sure she will be able to lie still.  Patient requires admission to the hospital for further treatment of her anemia and urinary tract infection.  At this time, I do not feel she needs emergent blood transfusion. We only have O- blood at the freestanding emergency department.    Linwood Dibbles, MD 06/10/14 [phone]

## 2014-06-11 ENCOUNTER — Inpatient Hospital Stay (HOSPITAL_COMMUNITY): Payer: Medicare Other

## 2014-06-11 LAB — FERRITIN
Ferritin: 6 ng/mL — ABNORMAL LOW (ref 10–291)
Ferritin: 6 ng/mL — ABNORMAL LOW (ref 10–291)

## 2014-06-11 LAB — COMPREHENSIVE METABOLIC PANEL
ALBUMIN: 3.1 g/dL — AB (ref 3.5–5.2)
ALT: 12 U/L (ref 0–35)
ANION GAP: 11 (ref 5–15)
AST: 11 U/L (ref 0–37)
Alkaline Phosphatase: 94 U/L (ref 39–117)
BUN: 15 mg/dL (ref 6–23)
CALCIUM: 8.9 mg/dL (ref 8.4–10.5)
CO2: 26 mEq/L (ref 19–32)
CREATININE: 0.74 mg/dL (ref 0.50–1.10)
Chloride: 102 mEq/L (ref 96–112)
GFR calc Af Amer: 82 mL/min — ABNORMAL LOW (ref >90)
GFR calc non Af Amer: 71 mL/min — ABNORMAL LOW (ref >90)
Glucose, Bld: 87 mg/dL (ref 70–99)
Potassium: 4.1 mEq/L (ref 3.7–5.3)
Sodium: 139 mEq/L (ref 137–147)
TOTAL PROTEIN: 5.8 g/dL — AB (ref 6.0–8.3)
Total Bilirubin: 1 mg/dL (ref 0.3–1.2)

## 2014-06-11 LAB — TROPONIN I
Troponin I: <0.3 ng/mL (ref <0.30)
Troponin I: <0.3 ng/mL (ref <0.30)

## 2014-06-11 LAB — IRON AND TIBC
IRON: 149 ug/dL — AB (ref 42–135)
Iron: 148 ug/dL — ABNORMAL HIGH (ref 42–135)
Saturation Ratios: 38 % (ref 20–55)
Saturation Ratios: 40 % (ref 20–55)
TIBC: 396 ug/dL (ref 250–470)
TIBC: 371 ug/dL (ref 250–470)
UIBC: 247 ug/dL (ref 125–400)
UIBC: 223 ug/dL (ref 125–400)

## 2014-06-11 LAB — TSH: TSH: 1.74 u[IU]/mL (ref 0.350–4.500)

## 2014-06-11 LAB — FOLATE: FOLATE: 16.9 ng/mL

## 2014-06-11 LAB — VITAMIN B12
VITAMIN B 12: 400 pg/mL (ref 211–911)
Vitamin B-12: 399 pg/mL (ref 211–911)

## 2014-06-11 LAB — HEMOGLOBIN AND HEMATOCRIT, BLOOD
HEMATOCRIT: 31.3 % — AB (ref 36.0–46.0)
Hemoglobin: 10.1 g/dL — ABNORMAL LOW (ref 12.0–15.0)

## 2014-06-11 LAB — RETICULOCYTES
RBC.: 4.16 MIL/uL (ref 3.87–5.11)
RETIC COUNT ABSOLUTE: 33.3 10*3/uL (ref 19.0–186.0)
RETIC CT PCT: 0.8 % (ref 0.4–3.1)

## 2014-06-11 LAB — ABO/RH: ABO/RH(D): O POS

## 2014-06-11 LAB — PHOSPHORUS: PHOSPHORUS: 3 mg/dL (ref 2.3–4.6)

## 2014-06-11 LAB — MAGNESIUM: Magnesium: 2.3 mg/dL (ref 1.5–2.5)

## 2014-06-11 MED ORDER — FUROSEMIDE 10 MG/ML IJ SOLN
20.0000 mg | Freq: Once | INTRAMUSCULAR | Status: AC
  Administered 2014-06-11: 20 mg via INTRAVENOUS
  Filled 2014-06-11: qty 2

## 2014-06-11 MED ORDER — CARVEDILOL 3.125 MG PO TABS
3.1250 mg | ORAL_TABLET | Freq: Two times a day (BID) | ORAL | Status: DC
  Administered 2014-06-11 – 2014-06-15 (×7): 3.125 mg via ORAL
  Filled 2014-06-11 (×12): qty 1

## 2014-06-11 NOTE — Consult Note (Signed)
ORTHOPAEDIC CONSULTATION HISTORY & PHYSICAL REQUESTING PHYSICIAN: Leroy Sea, MD  Chief Complaint: Right hip pain with weightbearing  HPI: [name] is a 80 y.o. female who is known to me from the office, having treated her upper extremities.  Over the last 3 or so weeks, she has had progressive increase in right hip and groin pain, with normal x-rays and not relieved by intra-articular steroid injection.  She was admitted, and has been treated for anemia thought to have a GI cause.  MRI scan has been obtained revealing incomplete fractures of both femoral necks as well as one acetabulum, all thought to be insufficiency fractures.  She has a history of thoracolumbar compression fractures.  She has verysignificant hearing and vision deficits, but lives alone.  Past Medical History  Diagnosis Date  . Thyroid disease   . Hypertension   . Cannot hear   . Vision loss    Past Surgical History  Procedure Laterality Date  . Cesarean section    . Abdominal hysterectomy    . Kyphoplasty     History   Social History  . Marital Status: Widowed    Spouse Name: N/A    Number of Children: N/A  . Years of Education: N/A   Social History Main Topics  . Smoking status: Never Smoker   . Smokeless tobacco: None  . Alcohol Use: No  . Drug Use: No  . Sexual Activity: None   Other Topics Concern  . None   Social History Narrative  . None   Family History  Problem Relation Age of Onset  . Breast cancer Sister   . Cancer Brother   . Liver disease Son   . Lung cancer Son    Allergies  Allergen Reactions  . Aspirin Other (See Comments)    Nose bleeds.    Prior to Admission medications   Medication Sig Start Date End Date Taking? Authorizing Provider  acetaminophen-codeine (TYLENOL #3) 300-30 MG per tablet Take 1 tablet by mouth every 6 (six) hours as needed for moderate pain.   Yes Historical Provider, MD  cholecalciferol (VITAMIN D) 1000 UNITS tablet Take 3,000 Units by  mouth daily.   Yes Historical Provider, MD  levothyroxine (SYNTHROID, LEVOTHROID) 50 MCG tablet Take 50 mcg by mouth every morning.    Yes Historical Provider, MD  losartan (COZAAR) 50 MG tablet Take 50 mg by mouth every morning.    Yes Historical Provider, MD  triamterene-hydrochlorothiazide (DYAZIDE) 37.5-25 MG per capsule Take 1 capsule by mouth every morning.    Yes Historical Provider, MD  HYDROCODONE-GUAIFENESIN PO Take 5 mLs by mouth every 6 (six) hours as needed. For cough    Historical Provider, MD  traMADol (ULTRAM) 50 MG tablet Take 50 mg by mouth every 12 (twelve) hours.    Historical Provider, MD   Ct Abdomen Pelvis Wo Contrast  06/10/2014   CLINICAL DATA:  Right flank pain.  Nausea.  EXAM: CT ABDOMEN AND PELVIS WITHOUT CONTRAST  TECHNIQUE: Multidetector CT imaging of the abdomen and pelvis was performed following the standard protocol without IV contrast.  COMPARISON:  None.  FINDINGS: Mild cardiomegaly. Minimal dependent atelectasis or scarring in the lung bases. No effusions.  Mild fullness of the renal pelves bilaterally which could reflect extrarenal pelves or Mild chronic UPJ obstructions. Findings are similar to prior study. Large right lower pole renal cyst measures up to 7.5 cm. Multiple bilateral renal cysts present. No ureteral stones. Urinary bladder is unremarkable.  Liver, gallbladder, spleen,  pancreas and adrenals have an unremarkable unenhanced appearance. Aorta and branch vessels are heavily calcified, non aneurysmal.  Sigmoid diverticulosis. No active diverticulitis. Moderate stool throughout the colon. Small bowel and stomach are decompressed, grossly unremarkable.  Numerous compression fractures in the thoracolumbar spine. Multi level Prior vertebral augmentation. Findings similar to prior study.  IMPRESSION: Mild fullness of the renal pelves bilaterally with decompressed ureters. This is stable since 2013. This could reflect extrarenal pelves or mild chronic UPJ obstruction.  No ureteral stones.  Bilateral renal cysts.  Sigmoid diverticulosis.  No active diverticulitis.  No acute findings in the abdomen or pelvis.   Electronically Signed   By: Charlett Nose M.D.   On: 06/10/2014 14:51   Mr Lumbar Spine Wo Contrast  06/10/2014   CLINICAL DATA:  81 year old with low back pain radiating into the left leg. History of vertebroplasty. Urinary incontinence.  EXAM: MRI LUMBAR SPINE WITHOUT CONTRAST  TECHNIQUE: Multiplanar, multisequence MR imaging of the lumbar spine was performed. No intravenous contrast was administered.  COMPARISON:  MRI 01/16/2012.  Abdominal pelvic CT 06/10/2014.  FINDINGS: The lumbar alignment is stable and near anatomic. Multiple chronic thoracolumbar compression deformities are again noted. The patient has undergone previous spinal augmentation at T11, T12 and L1. Fractures at those levels have healed. There are chronic superior endplate compression deformities at L2 and L3 and inferior endplate compression deformities at L4 and L5. There is no evidence of acute fracture.  The conus medullaris extends to the L1-2 level and appears normal. No paraspinal abnormalities are identified.Multiple renal cysts are present bilaterally.  L1-2: Mild disc bulging. No significant spinal stenosis or nerve root encroachment.  L2-3: Annular disc bulging eccentric to the left with mild facet and ligamentous hypertrophy. There is mild narrowing of the lateral recesses. The foramina are patent.  L3-4: Mild disc bulging, facet and ligamentous hypertrophy. No significant spinal stenosis or nerve root encroachment.  L4-5: Disc bulging and osteophytes are asymmetric to the right. There is mild facet and ligamentous hypertrophy. There is mild narrowing of the lateral recesses. The foramina are patent.  L5-S1: There are moderate facet degenerative changes with mild disc bulging eccentric to the right. There is mild inferior foraminal narrowing on the right without definite nerve root  encroachment.  IMPRESSION: 1. Multiple healed thoracolumbar compression deformities status post spinal augmentation. No acute osseous findings. 2. Generally stable multilevel spondylosis. No high-grade spinal stenosis or nerve root encroachment demonstrated.   Electronically Signed   By: Roxy Horseman M.D.   On: 06/10/2014 17:21   Ct Hip Right Wo Contrast  06/11/2014   CLINICAL DATA:  Hip pain.  EXAM: CT OF THE RIGHT HIP WITHOUT CONTRAST  TECHNIQUE: Multidetector CT imaging was performed according to the standard protocol. Multiplanar CT image reconstructions were also generated.  COMPARISON:  CT 06/10/2014  FINDINGS: Diffuse osteopenia and degenerative change present. An extremely subtle lucency is noted along the medial aspect of the base of the right femoral neck . Punctate area air is noted within the medullary bone adjacent to this region. These changes may be related to an extremely subtle fracture of the base of the right femoral neck. No displaced fractures noted. Sacrum and SI joints are intact were visualized. Sigmoid colonic diverticulosis. Right inguinal hernia with herniation of fat only.Peripheral vascular disease.  IMPRESSION: 1. Extremely subtle lucency is noted along the medial aspect of the base of the right femoral neck. Adjacent punctate area of air is noted in the medullary bone. These changes may be  related to an extremely subtle fracture of the base of the right femoral neck. If further evaluation is needed MRI can be obtained. 2. Diffuse severe osteopenia and degenerative change.   Electronically Signed   By: Maisie Fus  Register   On: 06/11/2014 11:19   Mr Hip Right Wo Contrast  06/11/2014   CLINICAL DATA:  Persistent right hip pain. No reported acute injury or history of malignancy.  EXAM: MR OF THE RIGHT HIP WITHOUT CONTRAST  TECHNIQUE: Multiplanar, multisequence MR imaging was performed. No intravenous contrast was administered.  COMPARISON:  Right hip CT 06/11/2014, lumbar MRI  06/10/2014, pelvic CT 06/10/2014 and whole body bone scan 12/23/2011.  FINDINGS: Bones: There is a fracture of the proximal right femur extending laterally from the lesser trochanter, corresponding with the recent CT finding. This fracture extends into the intertrochanteric region, but appears incomplete. There is prominent surrounding bone marrow and soft tissue edema. There is a contralateral fracture of the left femoral neck medially with marrow edema, also appearing incomplete. In addition, there is a fracture of the left acetabular roof with a linear component best seen on the coronal images. The sacroiliac joints and symphysis pubis appear normal.  Articular cartilage and labrum  Articular cartilage: There are mild degenerative changes at both hips. There is no evidence of femoral head avascular necrosis.  Labrum: There is acetabular labral degeneration without paralabral cyst formation.  Joint or bursal effusion  Joint effusion: Small bilateral hip joint effusions.  Bursae: There is no focal periarticular fluid collection. However, there is moderate edema surrounding the proximal right femur fracture.  Muscles and tendons  Muscles and tendons: The gluteus, hamstring and iliopsoas tendons appear normal. The piriformis muscles appear symmetric.  Other findings  Miscellaneous: Bilateral renal cysts are partially imaged. There are chronic compression deformities within the lower lumbar spine. There is prominent fat within both inguinal canals which appears stable.  IMPRESSION: 1. There are multiple fractures involving the medial intertrochanteric region of the right femur, the left femoral neck medially and the left superior acetabulum. Given the presence of numerous thoracolumbar compression deformities on prior imaging, these are likely insufficiency fractures. No definite pathologic features or other suspicious marrow lesions identified. 2. The right femur fracture appears incomplete, although there is  surrounding soft tissue edema. With continued weight-bearing, this is at risk to become a complete fracture.   Electronically Signed   By: Roxy Horseman M.D.   On: 06/11/2014 14:35   Dg Chest Port 1 View  06/10/2014   CLINICAL DATA:  Cough and shortness of breath. Right lower back pain.  EXAM: PORTABLE CHEST - 1 VIEW  COMPARISON:  01/17/2010  FINDINGS: Shallow inspiration. Borderline heart size with normal pulmonary vascularity. Peribronchial thickening and central interstitial changes suggesting chronic bronchitis. No focal airspace disease or consolidation in the lungs. No blunting of costophrenic angles. No pneumothorax. Calcified and tortuous aorta. Degenerative changes in the spine and shoulders. Multiple previous kyphoplasties in the thoracic spine.  IMPRESSION: Chronic bronchitic changes. No evidence of active pulmonary disease.   Electronically Signed   By: Burman Nieves M.D.   On: 06/10/2014 22:13    Positive ROS: All other systems have been reviewed and were otherwise negative with the exception of those mentioned in the HPI and as above.  Physical Exam: Vitals: Refer to EMR. Constitutional:  WD, WN, NAD HEENT:  NCAT, EOMI--Poor hearing and vision Neuro/Psych:  Alert & oriented to person, place, and time; appropriate mood & affect Lymphatic: No generalized extremity edema  or lymphadenopathy Extremities / MSK:  The extremities are normal with respect to appearance, ranges of motion, joint stability, muscle strength/tone, sensation, & perfusion except as otherwise noted:  She does not have pain with passive internal or external rotation of both hips.  She does not have pain with slapping or jarring the sole of each foot.  She can pull each leg up the knee toward her chest and then extend them back to the bed.  Assessment: Insufficiency incomplete fractures of both femoral necks and left superior acetabulum  Plan: I discussed her complex situation with her daughter [name].After consideration of the options and complexities involved, we arrived at a plan for initial nonoperative treatment of this symptomatic right side with some protected weightbearing.  She would likely be best served by short-term placement at a SNF.  The patient's daughter understands that incomplete fractures can progress, and become completely displaced fractures.  If this were to happen, then we would clearly shift directions and treat it operatively.  Likewise, if she remains symptomatic despite what would seem to be appropriate time for healing, we can revisit operative treatment.  Followup with me in the office in 2-3 weeks.  Cliffton Asters Janee Morn, MD      Orthopaedic & Hand Surgery [facility] Orthopaedic & Sports Medicine [facility] [address] [place], [place]  [address] Office: [phone] Mobile: [phone]

## 2014-06-11 NOTE — Progress Notes (Signed)
UR Completed.  [name] Jane [phone] 06/11/2014  

## 2014-06-11 NOTE — Progress Notes (Signed)
Patient Demographics  [name], is a 80 y.o. female, DOB - 1919/04/17, ZOX:[id number]  Admit date - 06/10/2014   Admitting Physician Edsel Petrin, DO  Outpatient Primary MD for the patient is Paruchuri, Janace Hoard, MD  LOS - 1   Chief Complaint  Patient presents with  . Leg Swelling        Subjective:   [name] today has, No headache, No chest pain, No abdominal pain - No Nausea, No new weakness tingling or numbness, No Cough - SOB. Ongoing right hip pain radiating all the way down to her foot  Assessment & Plan    1. Right hip and leg pain. Being followed by Dr. Turner Daniels and in the outpatient setting, CT of the right hip suggestive of possible subtle fracture, obtain right hip MRI, consult orthopedics. For now bedrest.   2. Anemia. Anemia of iron deficiency, stool negative for occult blood, likely intermittent GI bleed, due to advanced age discussed with patient's daughter who agrees with no further workup. She is stable post 2 units of packed RBC transfusion, will monitor Iron panel and supplement and if needed. Plan is to monitor H&H in the outpatient setting along with iron levels, outpatient intermittent transfusion. Not a candidate for colectomy, radiation chemotherapy place on PPI.   3. Isolated one episode of atrial fibrillation in the ER. Resolved, placed on low-dose beta blocker monitor on telemetry. Check TSH and baseline echogram.     4.Hypertension - now place on Coreg and monitor.   5. UTI. On Rocephin and monitor cultures.    6. Severe bilateral hearing loss. Supportive care.   7. Hypothyroidism continue home dose Synthroid monitor TSH.      Code Status: N.r.  Family Communication: Daughter  Disposition Plan: To be decided   Procedures 2 unit packed RBC  transfusion, CT-guided, MRI right hip, MR L. spine   Consults Ortho   Medications  Scheduled Meds: . carvedilol  3.125 mg Oral BID WC  . cefTRIAXone (ROCEPHIN)  IV  1 g Intravenous Q24H  . docusate sodium  100 mg Oral BID  . furosemide  20 mg Intravenous Once  . gabapentin  100 mg Oral QHS  . levothyroxine  50 mcg Oral Q0600  . pantoprazole  40 mg Oral Q1200  . senna  1 tablet Oral BID  . sodium chloride  3 mL Intravenous Q12H   Continuous Infusions:  PRN Meds:.acetaminophen, methocarbamol (ROBAXIN) IV, morphine injection, ondansetron (ZOFRAN) IV, oxyCODONE-acetaminophen  DVT Prophylaxis    SCDs    Lab Results  Component Value Date   PLT 405* 06/10/2014    Antibiotics   Anti-infectives   Start     Dose/Rate Route Frequency Ordered Stop   06/10/14 1600  cefTRIAXone (ROCEPHIN) 1 g in dextrose 5 % 50 mL IVPB     1 g 100 mL/hr over 30 Minutes Intravenous Every 24 hours 06/10/14 1545     06/10/14 1548  cefTRIAXone (ROCEPHIN) 1 G injection    Comments:  Haskins, Kaila   : cabinet override      06/10/14 1548 06/10/14 1554          Objective:   Filed Vitals:   06/11/14 0346 06/11/14 0445 06/11/14 0700 06/11/14 0815  BP: 94/40  126/64  141/85  Pulse: 46 69  65  Temp: 98.2 F (36.8 C) 97.7 F (36.5 C) 98.2 F (36.8 C) 98.2 F (36.8 C)  TempSrc: Oral Oral Oral Oral  Resp: Height:      Weight:      SpO2: 95% 98%  95%    Wt Readings from Last 3 Encounters:  06/10/14 51 kg (112 lb 7 oz)  01/09/12 53.071 kg (117 lb)  12/21/11 53.071 kg (117 lb)     Intake/Output Summary (Last 24 hours) at 06/11/14 1143 Last data filed at 06/11/14 1000  Gross per 24 hour  Intake   1195 ml  Output    950 ml  Net    245 ml     Physical Exam  Awake , extremely hard of hearing, No new F.N deficits, Normal affect [place].AT,PERRAL Supple Neck,No JVD, No cervical lymphadenopathy appriciated.  Symmetrical Chest wall movement, Good air movement bilaterally, CTAB RRR,No  Gallops,Rubs or new Murmurs, No Parasternal Heave +ve B.Sounds, Abd Soft, No tenderness, No organomegaly appriciated, No rebound - guarding or rigidity. No Cyanosis, Clubbing or edema, No new Rash or bruise  Right hip range of motion is painful    Data Review   Micro Results Recent Results (from the past 240 hour(s))  MRSA PCR SCREENING     Status: None   Collection Time    06/10/14  6:54 PM      Result Value Ref Range Status   MRSA by PCR NEGATIVE  NEGATIVE Final   Comment:            The GeneXpert MRSA Assay (FDA     approved for NASAL specimens     only), is one component of a     comprehensive MRSA colonization     surveillance program. It is not     intended to diagnose MRSA     infection nor to guide or     monitor treatment for     MRSA infections.    Radiology Reports Ct Abdomen Pelvis Wo Contrast  06/10/2014   CLINICAL DATA:  Right flank pain.  Nausea.  EXAM: CT ABDOMEN AND PELVIS WITHOUT CONTRAST  TECHNIQUE: Multidetector CT imaging of the abdomen and pelvis was performed following the standard protocol without IV contrast.  COMPARISON:  None.  FINDINGS: Mild cardiomegaly. Minimal dependent atelectasis or scarring in the lung bases. No effusions.  Mild fullness of the renal pelves bilaterally which could reflect extrarenal pelves or Mild chronic UPJ obstructions. Findings are similar to prior study. Large right lower pole renal cyst measures up to 7.5 cm. Multiple bilateral renal cysts present. No ureteral stones. Urinary bladder is unremarkable.  Liver, gallbladder, spleen, pancreas and adrenals have an unremarkable unenhanced appearance. Aorta and branch vessels are heavily calcified, non aneurysmal.  Sigmoid diverticulosis. No active diverticulitis. Moderate stool throughout the colon. Small bowel and stomach are decompressed, grossly unremarkable.  Numerous compression fractures in the thoracolumbar spine. Multi level Prior vertebral augmentation. Findings similar to prior  study.  IMPRESSION: Mild fullness of the renal pelves bilaterally with decompressed ureters. This is stable since 2013. This could reflect extrarenal pelves or mild chronic UPJ obstruction. No ureteral stones.  Bilateral renal cysts.  Sigmoid diverticulosis.  No active diverticulitis.  No acute findings in the abdomen or pelvis.   Electronically Signed   By: Charlett Nose M.D.   On: 06/10/2014 14:51   Mr Lumbar Spine Wo Contrast  06/10/2014   CLINICAL DATA:  80 year old with low back pain radiating into the left leg. History of vertebroplasty. Urinary incontinence.  EXAM: MRI LUMBAR SPINE WITHOUT CONTRAST  TECHNIQUE: Multiplanar, multisequence MR imaging of the lumbar spine was performed. No intravenous contrast was administered.  COMPARISON:  MRI 01/16/2012.  Abdominal pelvic CT 06/10/2014.  FINDINGS: The lumbar alignment is stable and near anatomic. Multiple chronic thoracolumbar compression deformities are again noted. The patient has undergone previous spinal augmentation at T11, T12 and L1. Fractures at those levels have healed. There are chronic superior endplate compression deformities at L2 and L3 and inferior endplate compression deformities at L4 and L5. There is no evidence of acute fracture.  The conus medullaris extends to the L1-2 level and appears normal. No paraspinal abnormalities are identified.Multiple renal cysts are present bilaterally.  L1-2: Mild disc bulging. No significant spinal stenosis or nerve root encroachment.  L2-3: Annular disc bulging eccentric to the left with mild facet and ligamentous hypertrophy. There is mild narrowing of the lateral recesses. The foramina are patent.  L3-4: Mild disc bulging, facet and ligamentous hypertrophy. No significant spinal stenosis or nerve root encroachment.  L4-5: Disc bulging and osteophytes are asymmetric to the right. There is mild facet and ligamentous hypertrophy. There is mild narrowing of the lateral recesses. The foramina are patent.   L5-S1: There are moderate facet degenerative changes with mild disc bulging eccentric to the right. There is mild inferior foraminal narrowing on the right without definite nerve root encroachment.  IMPRESSION: 1. Multiple healed thoracolumbar compression deformities status post spinal augmentation. No acute osseous findings. 2. Generally stable multilevel spondylosis. No high-grade spinal stenosis or nerve root encroachment demonstrated.   Electronically Signed   By: Roxy Horseman M.D.   On: 06/10/2014 17:21   Ct Hip Right Wo Contrast  06/11/2014   CLINICAL DATA:  Hip pain.  EXAM: CT OF THE RIGHT HIP WITHOUT CONTRAST  TECHNIQUE: Multidetector CT imaging was performed according to the standard protocol. Multiplanar CT image reconstructions were also generated.  COMPARISON:  CT 06/10/2014  FINDINGS: Diffuse osteopenia and degenerative change present. An extremely subtle lucency is noted along the medial aspect of the base of the right femoral neck . Punctate area air is noted within the medullary bone adjacent to this region. These changes may be related to an extremely subtle fracture of the base of the right femoral neck. No displaced fractures noted. Sacrum and SI joints are intact were visualized. Sigmoid colonic diverticulosis. Right inguinal hernia with herniation of fat only.Peripheral vascular disease.  IMPRESSION: 1. Extremely subtle lucency is noted along the medial aspect of the base of the right femoral neck. Adjacent punctate area of air is noted in the medullary bone. These changes may be related to an extremely subtle fracture of the base of the right femoral neck. If further evaluation is needed MRI can be obtained. 2. Diffuse severe osteopenia and degenerative change.   Electronically Signed   By: Maisie Fus  Register   On: 06/11/2014 11:19   Dg Chest Port 1 View  06/10/2014   CLINICAL DATA:  Cough and shortness of breath. Right lower back pain.  EXAM: PORTABLE CHEST - 1 VIEW  COMPARISON:   01/17/2010  FINDINGS: Shallow inspiration. Borderline heart size with normal pulmonary vascularity. Peribronchial thickening and central interstitial changes suggesting chronic bronchitis. No focal airspace disease or consolidation in the lungs. No blunting of costophrenic angles. No pneumothorax. Calcified and tortuous aorta. Degenerative changes in the spine and shoulders. Multiple previous kyphoplasties in the thoracic spine.  IMPRESSION: Chronic bronchitic changes. No evidence of active pulmonary disease.   Electronically Signed   By: Burman Nieves M.D.   On: 06/10/2014 22:13    CBC  Recent Labs Lab 06/10/14 1300 06/11/14 1110  WBC 9.2  --   HGB 6.4* 10.1*  HCT 21.0* 31.3*  PLT 405*  --   MCV 65.2*  --   MCH 19.9*  --   MCHC 30.5  --   RDW 16.3*  --   LYMPHSABS 0.6*  --   MONOABS 1.0  --   EOSABS 0.1  --   BASOSABS 0.0  --     Chemistries   Recent Labs Lab 06/10/14 1300 06/10/14 2059 06/11/14 0730  NA 139  --  139  K 3.5*  --  4.1  CL 100  --  102  CO2 27  --  26  GLUCOSE 132*  --  87  BUN 22  --  15  CREATININE 0.90  --  0.74  CALCIUM 10.1  --  8.9  MG  --   --  2.3  AST  --  10 11  ALT  --  12 12  ALKPHOS  --  93 94  BILITOT  --  0.3 1.0   ------------------------------------------------------------------------------------------------------------------ estimated creatinine clearance is 31.6 ml/min (by C-G formula based on Cr of 0.74). ------------------------------------------------------------------------------------------------------------------ No results found for this basename: HGBA1C,  in the last 72 hours ------------------------------------------------------------------------------------------------------------------ No results found for this basename: CHOL, HDL, LDLCALC, TRIG, CHOLHDL, LDLDIRECT,  in the last 72 hours ------------------------------------------------------------------------------------------------------------------  Recent Labs   06/10/14 2059  TSH 1.620   ------------------------------------------------------------------------------------------------------------------  Recent Labs  06/10/14 2059 06/11/14 0730  RETICCTPCT 1.3 0.8    Coagulation profile  Recent Labs Lab 06/10/14 2059  INR 1.11    No results found for this basename: DDIMER,  in the last 72 hours  Cardiac Enzymes  Recent Labs Lab 06/10/14 2052 06/11/14 0730 06/11/14 0733  TROPONINI <0.30 <0.30 <0.30   ------------------------------------------------------------------------------------------------------------------ No components found with this basename: POCBNP,      Time Spent in minutes  35   SINGH,PRASHANT K M.D on 06/11/2014 at 11:43 AM  Between 7am to 7pm - Pager - [phone]  After 7pm go to [email or web address] - password [email or web address]  And look for the night coverage person covering for me after hours  [facility] Office  [phone]   **Disclaimer: This note may have been dictated with voice recognition software. Similar sounding words can inadvertently be transcribed and this note may contain transcription errors which may not have been corrected upon publication of note.**

## 2014-06-11 NOTE — Progress Notes (Signed)
Report called to 5W-Robyn, RN-VSS- family aware of tx.

## 2014-06-12 DIAGNOSIS — I359 Nonrheumatic aortic valve disorder, unspecified: Secondary | ICD-10-CM

## 2014-06-12 LAB — TYPE AND SCREEN
ABO/RH(D): O POS
Antibody Screen: NEGATIVE
Unit division: 0
Unit division: 0

## 2014-06-12 LAB — CBC WITH DIFFERENTIAL/PLATELET
Basophils Absolute: 0.1 10*3/uL (ref 0.0–0.1)
Basophils Relative: 1 % (ref 0–1)
EOS ABS: 0.2 10*3/uL (ref 0.0–0.7)
Eosinophils Relative: 2 % (ref 0–5)
HEMATOCRIT: 29.6 % — AB (ref 36.0–46.0)
HEMOGLOBIN: 9.8 g/dL — AB (ref 12.0–15.0)
Lymphocytes Relative: 10 % — ABNORMAL LOW (ref 12–46)
Lymphs Abs: 1.1 10*3/uL (ref 0.7–4.0)
MCH: 22.6 pg — AB (ref 26.0–34.0)
MCHC: 33.1 g/dL (ref 30.0–36.0)
MCV: 68.2 fL — AB (ref 78.0–100.0)
MONO ABS: 0.9 10*3/uL (ref 0.1–1.0)
Monocytes Relative: 9 % (ref 3–12)
NEUTROS ABS: 8.2 10*3/uL — AB (ref 1.7–7.7)
NEUTROS PCT: 78 % — AB (ref 43–77)
Platelets: ADEQUATE 10*3/uL (ref 150–400)
RBC: 4.34 MIL/uL (ref 3.87–5.11)
RDW: 20.2 % — ABNORMAL HIGH (ref 11.5–15.5)
WBC: 10.5 10*3/uL (ref 4.0–10.5)

## 2014-06-12 MED ORDER — LORAZEPAM 0.5 MG PO TABS
0.2500 mg | ORAL_TABLET | Freq: Four times a day (QID) | ORAL | Status: DC | PRN
  Administered 2014-06-12 – 2014-06-15 (×3): 0.25 mg via ORAL
  Filled 2014-06-12 (×4): qty 1

## 2014-06-12 MED ORDER — SODIUM CHLORIDE 0.9 % IV SOLN
25.0000 mg | Freq: Once | INTRAVENOUS | Status: AC
  Administered 2014-06-12: 25 mg via INTRAVENOUS
  Filled 2014-06-12: qty 0.5

## 2014-06-12 MED ORDER — LORAZEPAM 1 MG PO TABS
1.0000 mg | ORAL_TABLET | Freq: Once | ORAL | Status: AC
  Administered 2014-06-12: 1 mg via ORAL
  Filled 2014-06-12: qty 1

## 2014-06-12 MED ORDER — SODIUM CHLORIDE 0.9 % IV SOLN
1000.0000 mg | Freq: Once | INTRAVENOUS | Status: AC
  Administered 2014-06-12: 1000 mg via INTRAVENOUS
  Filled 2014-06-12 (×2): qty 20

## 2014-06-12 NOTE — Care Management Note (Signed)
    Page 1 of 1   06/15/2014     12:08:51 PM CARE MANAGEMENT NOTE 06/15/2014  Patient:  [name], [name]   Account Number:  [id number]  Date Initiated:  06/12/2014  Documentation initiated by:  Letha Cape  Subjective/Objective Assessment:   dx leg and feet swelling, incoplete fxs of femoral neck (bil)  admit- lives alone.     Action/Plan:   pt eval- pt rec snf  9/1/- stabilization of femur fx with IMN   Anticipated DC Date:  06/15/2014   Anticipated DC Plan:  SKILLED NURSING FACILITY  In-house referral  Clinical Social Worker      DC Planning Services  CM consult      Choice offered to / List presented to:             Status of service:  Completed, signed off Medicare Important Message given?  YES (If response is "NO", the following Medicare IM given date fields will be blank) Date Medicare IM given:  06/13/2014 Medicare IM given by:  Letha Cape Date Additional Medicare IM given:   Additional Medicare IM given by:    Discharge Disposition:  SKILLED NURSING FACILITY  Per UR Regulation:  Reviewed for med. necessity/level of care/duration of stay  If discussed at Long Length of Stay Meetings, dates discussed:    Comments:  06/15/14 1207 Letha Cape RN, BSN [phone] patient for dc to snf today. CSW following.  06/14/14 1340 Letha Cape RN, BSN [phone] patient is pod 1 of stablilization of femur fx with IMN. Plan is for [facility] if CIR is not appropriate.  06/12/14 1531 Letha Cape RN, BSN [phone] patient lives alone, per physical therapy eval recs snf, CSW referral.

## 2014-06-12 NOTE — Progress Notes (Signed)
MEDICATION RELATED CONSULT NOTE - INITIAL   Pharmacy Consult for IV Iron Dextran Indication: IDA   Allergies  Allergen Reactions  . Aspirin Other (See Comments)    Nose bleeds.     Patient Measurements: Height:  (165.1 cm) Weight: 117 lb (53.071 kg) IBW/kg (Calculated) : 57  Vital Signs: Temp: 98.4 F (36.9 C) (08/31 0511) Temp src: Oral (08/31 0511) BP: 137/66 mmHg (08/31 1054) Pulse Rate: 74 (08/31 1054) Intake/Output from previous day: 08/30 0701 - 08/31 0700 In: 220 [P.O.:120; IV Piggyback:100] Out: 1652 [Urine:1651; Stool:1] Intake/Output from this shift:    Labs:  Recent Labs  06/10/14 1300 06/10/14 2059 06/11/14 0730 06/11/14 1110 06/12/14 0555  WBC 9.2  --   --   --  10.5  HGB 6.4*  --   --  10.1* 9.8*  HCT 21.0*  --   --  31.3* 29.6*  PLT 405*  --   --   --  PLATELET CLUMPS NOTED ON SMEAR, COUNT APPEARS ADEQUATE  CREATININE 0.90  --  0.74  --   --   MG  --   --  2.3  --   --   PHOS  --   --  3.0  --   --   ALBUMIN  --  3.1* 3.1*  --   --   PROT  --  5.7* 5.8*  --   --   AST  --  10 11  --   --   ALT  --  12 12  --   --   ALKPHOS  --  93 94  --   --   BILITOT  --  0.3 1.0  --   --   BILIDIR  --  <0.2  --   --   --   IBILI  --  NOT CALCULATED  --   --   --    Estimated Creatinine Clearance: 36.8 ml/min (by C-G formula based on Cr of 0.74).   Medical History: Past Medical History  Diagnosis Date  . Thyroid disease   . Hypertension   . Cannot hear   . Vision loss     Medications:  Prescriptions prior to admission  Medication Sig Dispense Refill  . acetaminophen-codeine (TYLENOL #3) 300-30 MG per tablet Take 1 tablet by mouth every 6 (six) hours as needed for moderate pain.      . cholecalciferol (VITAMIN D) 1000 UNITS tablet Take 3,000 Units by mouth daily.      Marland Kitchen levothyroxine (SYNTHROID, LEVOTHROID) 50 MCG tablet Take 50 mcg by mouth every morning.       Marland Kitchen losartan (COZAAR) 50 MG tablet Take 50 mg by mouth every morning.       .  triamterene-hydrochlorothiazide (DYAZIDE) 37.5-25 MG per capsule Take 1 capsule by mouth every morning.       Marland Kitchen HYDROCODONE-GUAIFENESIN PO Take 5 mLs by mouth every 6 (six) hours as needed. For cough      . traMADol (ULTRAM) 50 MG tablet Take 50 mg by mouth every 12 (twelve) hours.        Assessment: 54 YOF with iron deficiency anemia. Pharmacy consulted to dose Iron Dextran in patient.  LBW = 53 kg Dose (mL) = 0.0442 x (14.8-9.8) x 53 kg + (0.26 x 53 kg) = 25 mL = 1250 mg   Goal of Therapy:  Resolution of IDA  Plan:  -Give Infed test dose of 25 mg x 1 followed by max dose of Infed 1  gm one hour later if test dose is tolerated  -Monitor for infusion reactions  -Pharmacy to sign off   Vinnie Level, PharmD.  Clinical Pharmacist Pager [phone]

## 2014-06-12 NOTE — Clinical Social Work Psychosocial (Signed)
Clinical Social Work Department BRIEF PSYCHOSOCIAL ASSESSMENT 06/12/2014  Patient:  [name], [name]     Account Number:  [id number]     Admit date:  06/10/2014  Clinical Social Worker:  Lovey Newcomer  Date/Time:  06/12/2014 02:51 PM  Referred by:  Physician  Date Referred:  06/12/2014 Referred for  SNF Placement   Other Referral:   Interview type:  Other - See comment Other interview type:   Patient and patient's daughter Becky Sax interviewed to complete assessment.    PSYCHOSOCIAL DATA Living Status:  ALONE Admitted from facility:   Level of care:   Primary support name:  Sonja Primary support relationship to patient:  CHILD, ADULT Degree of support available:   Support is good.    CURRENT CONCERNS Current Concerns  Post-Acute Placement   Other Concerns:    SOCIAL WORK ASSESSMENT / PLAN CSW met with patient at bedside to complete assessment. Patient states that she would like CSW to speak with her daughter to determine DC plan. Patient states that she lives alone and states, "I have a big decision to make about my surgery." CSW spoke with patient's daughter who states that the plan will be for the patient to go to SNF at discharge as the patient will not have 24/7 supervision/assistance at discharge. CSW explained SNF search/placement process and answered questions. CSW will follow up with bed offers.   Assessment/plan status:  Psychosocial Support/Ongoing Assessment of Needs Other assessment/ plan:   Complete Fl2, Fax, PASRR   Information/referral to community resources:   CSW contact information and SNF list given.    PATIENT'S/FAMILY'S RESPONSE TO PLAN OF CARE: Patient and daughter are agreeable to SNF placement at discharge. CSW will assist as appropriate.       Liz Beach MSW, [place], [place], [phone]

## 2014-06-12 NOTE — Consult Note (Signed)
  Assessment: Insufficiency incomplete fractures of both femoral necks and left superior acetabulum  Plan: I again discussed her complex situation with her daughter Margarita Mail.  After consideration of the options and complexities involved, we arrived at a plan for initial nonoperative treatment of this symptomatic right side with some protected weightbearing.  The patient's daughter understands that incomplete fractures can progress, and become completely displaced fractures, but otherwise would be expected to heal uneventfully.  She will discuss options and issues with her mom tonight, and let me know how they would like to proceed.  Cliffton Asters Janee Morn, MD      Orthopaedic & Hand Surgery [facility] [address] [place], [place]  [address] Office: [phone] Mobile: [phone]

## 2014-06-12 NOTE — Evaluation (Signed)
Physical Therapy Evaluation Patient Details Name: [name] MRN: [id number] DOB: 29-May-1919 Today's Date: 06/12/2014   History of Present Illness  Patient is a pleasant 80 y/o female with hx of HTN, thyroid disease, hearing loss and vision deficits presents with anemia and UTI and was found to have transient episode of A.fib associated with chest pain. Complains of right hip pain radiating down to her foot with weightbearing.  Pt with incomplete fractures of both femoral necks and left superior acetabulum. Per ortho, TDWB RLE to perform SPT bed<->w/c.   Clinical Impression  Patient presents with functional limitations due to deficits listed in PT problem list (see below). Pt with generalized weakness and balance deficits due to inability to bear full weight through RLE. Due to impaired hearing, difficult to assess if pt understands/comprehends WB status of RLE. Pt willing to maintain NWB during transfers for safety and tolerated squat pivot transfer x2 with Min A. Pt would benefit from acute PT and follow up [facility] SNF to improve transfers and overall safe mobility so pt can maximize independence. Will not be able to function safely at home alone due to new findings in right hip and increased risk of falls.    Follow Up Recommendations SNF;Supervision/Assistance - 24 hour    Equipment Recommendations  None recommended by PT    Recommendations for Other Services OT consult     Precautions / Restrictions Precautions Precautions: Fall Restrictions Weight Bearing Restrictions: Yes RLE Weight Bearing: Touchdown weight bearing      Mobility  Bed Mobility Overal bed mobility: Needs Assistance Bed Mobility: Rolling;Sidelying to Sit Rolling: Supervision Sidelying to sit: HOB elevated;Min assist       General bed mobility comments: Required assist scooting BLEs/hips to EOB, able to elevate trunk with Min A. Use of rail.  Transfers Overall transfer level: Needs assistance   Transfers:  Squat Pivot Transfers     Squat pivot transfers: Min assist     General transfer comment: Performed squat pivot transfer Bed<--> BSC, BSC<--> chair with cues for technique and min A for balance. Pt maintained NWB through RLE during transfers. Difficult assessing if pt understands TDWB status so maintained NWB for safety.  Ambulation/Gait             General Gait Details: Not assessed.  Stairs            Wheelchair Mobility    Modified Rankin (Stroke Patients Only)       Balance Overall balance assessment: Needs assistance   Sitting balance-Leahy Scale: Good Sitting balance - Comments: Able to sit EOB without support, total A to donn socks. Able to perform pericare without assist.     Standing balance-Leahy Scale: Poor Standing balance comment: Requires Min A for safety during dynamic standing/transfers due to unsteadiness and pt NWB status on RLE.                             Pertinent Vitals/Pain Pain Assessment: No/denies pain    Home Living Family/patient expects to be discharged to:: Skilled nursing facility Living Arrangements: Alone                    Prior Function Level of Independence: Independent with assistive device(s)   Gait / Transfers Assistance Needed: Reports Mod I with RW, no falls.  ADL's / Homemaking Assistance Needed: Pt reports her daughter assists with medication management, grocery shopping and driving.  Does sponge bathes at home.  Comments: Pt very HOH, does not wear hearing aides. Legally blind per MD note. Pt with macular degeneration of right eye and poor vision left eye. Glasses are at home.     Hand Dominance        Extremity/Trunk Assessment   Upper Extremity Assessment: Overall WFL for tasks assessed (age appropriate.)           Lower Extremity Assessment: Generalized weakness         Communication   Communication: HOH  Cognition Arousal/Alertness: Awake/alert Behavior During Therapy:  WFL for tasks assessed/performed Overall Cognitive Status: Within Functional Limits for tasks assessed                      General Comments General comments (skin integrity, edema, etc.): Very HOH and difficult to communicate at times.     Exercises        Assessment/Plan    PT Assessment Patient needs continued PT services  PT Diagnosis Generalized weakness   PT Problem List Decreased strength;Decreased balance;Decreased mobility;Decreased knowledge of precautions;Decreased safety awareness  PT Treatment Interventions DME instruction;Balance training;Patient/family education;Functional mobility training;Therapeutic activities;Therapeutic exercise;Wheelchair mobility training   PT Goals (Current goals can be found in the Care Plan section) Acute Rehab PT Goals Patient Stated Goal: to be able to move around PT Goal Formulation: With patient Time For Goal Achievement: 06/26/14 Potential to Achieve Goals: Good    Frequency Min 2X/week   Barriers to discharge Decreased caregiver support Pt lives alone.    Co-evaluation               End of Session Equipment Utilized During Treatment: Gait belt Activity Tolerance: Patient tolerated treatment well Patient left: in chair;with call bell/phone within reach Nurse Communication: Mobility status;Weight bearing status;Precautions         Time: 9562-1308 PT Time Calculation (min): 39 min   Charges:   PT Evaluation $Initial PT Evaluation Tier I: 1 Procedure PT Treatments $Therapeutic Activity: 23-37 mins   PT G CodesAlvie Heidelberg A 06/12/2014, 11:17 AM Alvie Heidelberg, PT, DPT [phone]

## 2014-06-12 NOTE — Clinical Social Work Placement (Signed)
Clinical Social Work Department CLINICAL SOCIAL WORK PLACEMENT NOTE 06/12/2014  Patient:  [name], [name]  Account Number:  [id number] Admit date:  06/10/2014  Clinical Social Worker:  Lavell Luster  Date/time:  06/12/2014 04:25 PM  Clinical Social Work is seeking post-discharge placement for this patient at the following level of care:   SKILLED NURSING   (*CSW will update this form in Epic as items are completed)   06/12/2014  Patient/family provided with [facility] Department of Clinical Social Work's list of facilities offering this level of care within the geographic area requested by the patient (or if unable, by the patient's family).  06/12/2014  Patient/family informed of their freedom to choose among providers that offer the needed level of care, that participate in Medicare, Medicaid or managed care program needed by the patient, have an available bed and are willing to accept the patient.  06/12/2014  Patient/family informed of [facility]' ownership interest in [facility], as well as of the fact that they are under no obligation to receive care at this facility.  PASARR submitted to EDS on 06/12/2014 PASARR number received on 06/12/2014  FL2 transmitted to all facilities in geographic area requested by pt/family on  06/12/2014 FL2 transmitted to all facilities within larger geographic area on   Patient informed that his/her managed care company has contracts with or will negotiate with  certain facilities, including the following:     Patient/family informed of bed offers received:   Patient chooses bed at  Physician recommends and patient chooses bed at    Patient to be transferred to  on   Patient to be transferred to facility by  Patient and family notified of transfer on  Name of family member notified:    The following physician request were entered in Epic:   Additional Comments:   Roddie Mc MSW, [place], [place], [phone]

## 2014-06-12 NOTE — Progress Notes (Signed)
Assessment:  Insufficiency incomplete fractures of both femoral necks and left superior acetabulum   Plan:  It is my understanding that the patient has been unable to comply with TDWB on the RLE.  Will allow WBAT on RLE for transfers, but restrict to bed-to-chair for now.  Cliffton Asters Janee Morn, MD  Orthopaedic & Hand Surgery  [facility]  [address]  [place], [place] [address]  Office: [phone]  Mobile: [phone]

## 2014-06-12 NOTE — Clinical Social Work Placement (Deleted)
Clinical Social Work Department CLINICAL SOCIAL WORK PLACEMENT NOTE 06/12/2014  Patient:  CROTTS,GEORGE B  Account Number:  [id number] Admit date:  06/09/2014  Clinical Social Worker:  Carra Brindley BRYANT Alysia Scism, LCSWA  Date/time:  06/12/2014 04:26 PM  Clinical Social Work is seeking post-discharge placement for this patient at the following level of care:   SKILLED NURSING   (*CSW will update this form in Epic as items are completed)   06/12/2014  Patient/family provided with [facility] Department of Clinical Social Work's list of facilities offering this level of care within the geographic area requested by the patient (or if unable, by the patient's family).  06/12/2014  Patient/family informed of their freedom to choose among providers that offer the needed level of care, that participate in Medicare, Medicaid or managed care program needed by the patient, have an available bed and are willing to accept the patient.  06/12/2014  Patient/family informed of [facility]' ownership interest in [facility], as well as of the fact that they are under no obligation to receive care at this facility.  PASARR submitted to EDS on 06/12/2014 PASARR number received on 06/12/2014  FL2 transmitted to all facilities in geographic area requested by pt/family on  06/12/2014 FL2 transmitted to all facilities within larger geographic area on   Patient informed that his/her managed care company has contracts with or will negotiate with  certain facilities, including the following:     Patient/family informed of bed offers received:   Patient chooses bed at  Physician recommends and patient chooses bed at    Patient to be transferred to  on   Patient to be transferred to facility by  Patient and family notified of transfer on  Name of family member notified:    The following physician request were entered in Epic:   Additional Comments:    Bryant Emmauel Hallums MSW, LCSWA, LCASA,  [phone] 

## 2014-06-12 NOTE — Progress Notes (Signed)
Patient Demographics  [name], is a 80 y.o. female, DOB - 1919/01/25, WUJ:[id number]  Admit date - 06/10/2014   Admitting Physician Edsel Petrin, DO  Outpatient Primary MD for the patient is Paruchuri, Janace Hoard, MD  LOS - 2   Chief Complaint  Patient presents with  . Leg Swelling        Subjective:   [name] today has, No headache, No chest pain, No abdominal pain - No Nausea, No new weakness tingling or numbness, No Cough - SOB.   Ongoing right hip pain radiating all the way down to her foot  Assessment & Plan    1. Right hip and leg pain. Being followed by Dr. Turner Daniels and in the outpatient setting, workup her with CT scan of the right hip and an MRI right hip shows a subtle right hip fracture. For now bedrest. Due for ORIF by orthopedics. She will be at moderate to high risk for adverse cardiopulmonary outcome due to her advanced age.    2. Anemia. Anemia of iron deficiency, stool negative for occult blood, likely intermittent GI bleed, due to advanced age discussed with patient's daughter who agrees with no further workup. She is stable post 2 units of packed RBC transfusion, iron and ferritin levels are low we'll request pharmacy to replace IV iron. Plan is to monitor H&H in the outpatient setting along with iron levels, outpatient intermittent transfusion. Not a candidate for colectomy, radiation chemotherapy place on PPI.    3. Isolated one episode of atrial fibrillation in the ER. Resolved, placed on low-dose beta blocker monitor on telemetry. Stable TSH. Echo pending.     4.Hypertension - now place on Coreg and monitor.    5. UTI. On Rocephin and monitor cultures.     6. Severe bilateral hearing loss. Supportive care.    7. Hypothyroidism continue home dose  Synthroid monitor TSH stable.      Code Status: N.r.  Family Communication: Daughter  Disposition Plan: To be decided   Procedures 2 unit packed RBC transfusion, CT-guided, MRI right hip, MR L. spine   Consults Ortho   Medications  Scheduled Meds: . carvedilol  3.125 mg Oral BID WC  . cefTRIAXone (ROCEPHIN)  IV  1 g Intravenous Q24H  . docusate sodium  100 mg Oral BID  . gabapentin  100 mg Oral QHS  . iron dextran (INFED/DEXFERRUM) infusion  25 mg Intravenous Once   Followed by  . iron dextran (INFED/DEXFERRUM) infusion  1,000 mg Intravenous Once  . levothyroxine  50 mcg Oral Q0600  . pantoprazole  40 mg Oral Q1200  . senna  1 tablet Oral BID  . sodium chloride  3 mL Intravenous Q12H   Continuous Infusions:  PRN Meds:.acetaminophen, methocarbamol (ROBAXIN) IV, ondansetron (ZOFRAN) IV, oxyCODONE-acetaminophen  DVT Prophylaxis    SCDs    Lab Results  Component Value Date   PLT PLATELET CLUMPS NOTED ON SMEAR, COUNT APPEARS ADEQUATE 06/12/2014    Antibiotics   Anti-infectives   Start     Dose/Rate Route Frequency Ordered Stop   06/10/14 1600  cefTRIAXone (ROCEPHIN) 1 g in dextrose 5 % 50 mL IVPB     1 g 100 mL/hr over 30 Minutes Intravenous Every 24 hours 06/10/14 1545  06/10/14 1548  cefTRIAXone (ROCEPHIN) 1 G injection    Comments:  Haskins, Kaila   : cabinet override      06/10/14 1548 06/10/14 1554          Objective:   Filed Vitals:   06/11/14 1208 06/11/14 1700 06/11/14 2105 06/12/14 0511  BP: 133/47 115/46 133/62 146/61  Pulse:   72 72  Temp: 98.1 F (36.7 C) 97.5 F (36.4 C) 98.2 F (36.8 C) 98.4 F (36.9 C)  TempSrc: Oral Oral Oral Oral  Resp: Height:   (1.651 m)    Weight:  53.071 kg (117 lb)    SpO2: 100% 98% 98% 96%    Wt Readings from Last 3 Encounters:  06/11/14 53.071 kg (117 lb)  01/09/12 53.071 kg (117 lb)  12/21/11 53.071 kg (117 lb)     Intake/Output Summary (Last 24 hours) at 06/12/14 1023 Last  data filed at 06/12/14 0631  Gross per 24 hour  Intake    220 ml  Output   1202 ml  Net   -982 ml     Physical Exam  Awake , extremely hard of hearing, No new F.N deficits, Normal affect [place].AT,PERRAL Supple Neck,No JVD, No cervical lymphadenopathy appriciated.  Symmetrical Chest wall movement, Good air movement bilaterally, CTAB RRR,No Gallops,Rubs or new Murmurs, No Parasternal Heave +ve B.Sounds, Abd Soft, No tenderness, No organomegaly appriciated, No rebound - guarding or rigidity. No Cyanosis, Clubbing or edema, No new Rash or bruise  Right hip range of motion is painful    Data Review   Micro Results Recent Results (from the past 240 hour(s))  MRSA PCR SCREENING     Status: None   Collection Time    06/10/14  6:54 PM      Result Value Ref Range Status   MRSA by PCR NEGATIVE  NEGATIVE Final   Comment:            The GeneXpert MRSA Assay (FDA     approved for NASAL specimens     only), is one component of a     comprehensive MRSA colonization     surveillance program. It is not     intended to diagnose MRSA     infection nor to guide or     monitor treatment for     MRSA infections.    Radiology Reports Ct Abdomen Pelvis Wo Contrast  06/10/2014   CLINICAL DATA:  Right flank pain.  Nausea.  EXAM: CT ABDOMEN AND PELVIS WITHOUT CONTRAST  TECHNIQUE: Multidetector CT imaging of the abdomen and pelvis was performed following the standard protocol without IV contrast.  COMPARISON:  None.  FINDINGS: Mild cardiomegaly. Minimal dependent atelectasis or scarring in the lung bases. No effusions.  Mild fullness of the renal pelves bilaterally which could reflect extrarenal pelves or Mild chronic UPJ obstructions. Findings are similar to prior study. Large right lower pole renal cyst measures up to 7.5 cm. Multiple bilateral renal cysts present. No ureteral stones. Urinary bladder is unremarkable.  Liver, gallbladder, spleen, pancreas and adrenals have an unremarkable unenhanced  appearance. Aorta and branch vessels are heavily calcified, non aneurysmal.  Sigmoid diverticulosis. No active diverticulitis. Moderate stool throughout the colon. Small bowel and stomach are decompressed, grossly unremarkable.  Numerous compression fractures in the thoracolumbar spine. Multi level Prior vertebral augmentation. Findings similar to prior study.  IMPRESSION: Mild fullness of the renal pelves bilaterally with decompressed ureters. This is stable since 2013. This could reflect  extrarenal pelves or mild chronic UPJ obstruction. No ureteral stones.  Bilateral renal cysts.  Sigmoid diverticulosis.  No active diverticulitis.  No acute findings in the abdomen or pelvis.   Electronically Signed   By: Charlett Nose M.D.   On: 06/10/2014 14:51   Mr Lumbar Spine Wo Contrast  06/10/2014   CLINICAL DATA:  80 year old with low back pain radiating into the left leg. History of vertebroplasty. Urinary incontinence.  EXAM: MRI LUMBAR SPINE WITHOUT CONTRAST  TECHNIQUE: Multiplanar, multisequence MR imaging of the lumbar spine was performed. No intravenous contrast was administered.  COMPARISON:  MRI 01/16/2012.  Abdominal pelvic CT 06/10/2014.  FINDINGS: The lumbar alignment is stable and near anatomic. Multiple chronic thoracolumbar compression deformities are again noted. The patient has undergone previous spinal augmentation at T11, T12 and L1. Fractures at those levels have healed. There are chronic superior endplate compression deformities at L2 and L3 and inferior endplate compression deformities at L4 and L5. There is no evidence of acute fracture.  The conus medullaris extends to the L1-2 level and appears normal. No paraspinal abnormalities are identified.Multiple renal cysts are present bilaterally.  L1-2: Mild disc bulging. No significant spinal stenosis or nerve root encroachment.  L2-3: Annular disc bulging eccentric to the left with mild facet and ligamentous hypertrophy. There is mild narrowing of the  lateral recesses. The foramina are patent.  L3-4: Mild disc bulging, facet and ligamentous hypertrophy. No significant spinal stenosis or nerve root encroachment.  L4-5: Disc bulging and osteophytes are asymmetric to the right. There is mild facet and ligamentous hypertrophy. There is mild narrowing of the lateral recesses. The foramina are patent.  L5-S1: There are moderate facet degenerative changes with mild disc bulging eccentric to the right. There is mild inferior foraminal narrowing on the right without definite nerve root encroachment.  IMPRESSION: 1. Multiple healed thoracolumbar compression deformities status post spinal augmentation. No acute osseous findings. 2. Generally stable multilevel spondylosis. No high-grade spinal stenosis or nerve root encroachment demonstrated.   Electronically Signed   By: Roxy Horseman M.D.   On: 06/10/2014 17:21   Ct Hip Right Wo Contrast  06/11/2014   CLINICAL DATA:  Hip pain.  EXAM: CT OF THE RIGHT HIP WITHOUT CONTRAST  TECHNIQUE: Multidetector CT imaging was performed according to the standard protocol. Multiplanar CT image reconstructions were also generated.  COMPARISON:  CT 06/10/2014  FINDINGS: Diffuse osteopenia and degenerative change present. An extremely subtle lucency is noted along the medial aspect of the base of the right femoral neck . Punctate area air is noted within the medullary bone adjacent to this region. These changes may be related to an extremely subtle fracture of the base of the right femoral neck. No displaced fractures noted. Sacrum and SI joints are intact were visualized. Sigmoid colonic diverticulosis. Right inguinal hernia with herniation of fat only.Peripheral vascular disease.  IMPRESSION: 1. Extremely subtle lucency is noted along the medial aspect of the base of the right femoral neck. Adjacent punctate area of air is noted in the medullary bone. These changes may be related to an extremely subtle fracture of the base of the right  femoral neck. If further evaluation is needed MRI can be obtained. 2. Diffuse severe osteopenia and degenerative change.   Electronically Signed   By: Maisie Fus  Register   On: 06/11/2014 11:19   Mr Hip Right Wo Contrast  06/11/2014   CLINICAL DATA:  Persistent right hip pain. No reported acute injury or history of malignancy.  EXAM: MR  OF THE RIGHT HIP WITHOUT CONTRAST  TECHNIQUE: Multiplanar, multisequence MR imaging was performed. No intravenous contrast was administered.  COMPARISON:  Right hip CT 06/11/2014, lumbar MRI 06/10/2014, pelvic CT 06/10/2014 and whole body bone scan 12/23/2011.  FINDINGS: Bones: There is a fracture of the proximal right femur extending laterally from the lesser trochanter, corresponding with the recent CT finding. This fracture extends into the intertrochanteric region, but appears incomplete. There is prominent surrounding bone marrow and soft tissue edema. There is a contralateral fracture of the left femoral neck medially with marrow edema, also appearing incomplete. In addition, there is a fracture of the left acetabular roof with a linear component best seen on the coronal images. The sacroiliac joints and symphysis pubis appear normal.  Articular cartilage and labrum  Articular cartilage: There are mild degenerative changes at both hips. There is no evidence of femoral head avascular necrosis.  Labrum: There is acetabular labral degeneration without paralabral cyst formation.  Joint or bursal effusion  Joint effusion: Small bilateral hip joint effusions.  Bursae: There is no focal periarticular fluid collection. However, there is moderate edema surrounding the proximal right femur fracture.  Muscles and tendons  Muscles and tendons: The gluteus, hamstring and iliopsoas tendons appear normal. The piriformis muscles appear symmetric.  Other findings  Miscellaneous: Bilateral renal cysts are partially imaged. There are chronic compression deformities within the lower lumbar spine.  There is prominent fat within both inguinal canals which appears stable.  IMPRESSION: 1. There are multiple fractures involving the medial intertrochanteric region of the right femur, the left femoral neck medially and the left superior acetabulum. Given the presence of numerous thoracolumbar compression deformities on prior imaging, these are likely insufficiency fractures. No definite pathologic features or other suspicious marrow lesions identified. 2. The right femur fracture appears incomplete, although there is surrounding soft tissue edema. With continued weight-bearing, this is at risk to become a complete fracture.   Electronically Signed   By: Roxy Horseman M.D.   On: 06/11/2014 14:35   Dg Chest Port 1 View  06/10/2014   CLINICAL DATA:  Cough and shortness of breath. Right lower back pain.  EXAM: PORTABLE CHEST - 1 VIEW  COMPARISON:  01/17/2010  FINDINGS: Shallow inspiration. Borderline heart size with normal pulmonary vascularity. Peribronchial thickening and central interstitial changes suggesting chronic bronchitis. No focal airspace disease or consolidation in the lungs. No blunting of costophrenic angles. No pneumothorax. Calcified and tortuous aorta. Degenerative changes in the spine and shoulders. Multiple previous kyphoplasties in the thoracic spine.  IMPRESSION: Chronic bronchitic changes. No evidence of active pulmonary disease.   Electronically Signed   By: Burman Nieves M.D.   On: 06/10/2014 22:13    CBC  Recent Labs Lab 06/10/14 1300 06/11/14 1110 06/12/14 0555  WBC 9.2  --  10.5  HGB 6.4* 10.1* 9.8*  HCT 21.0* 31.3* 29.6*  PLT 405*  --  PLATELET CLUMPS NOTED ON SMEAR, COUNT APPEARS ADEQUATE  MCV 65.2*  --  68.2*  MCH 19.9*  --  22.6*  MCHC 30.5  --  33.1  RDW 16.3*  --  20.2*  LYMPHSABS 0.6*  --  1.1  MONOABS 1.0  --  0.9  EOSABS 0.1  --  0.2  BASOSABS 0.0  --  0.1    Chemistries   Recent Labs Lab 06/10/14 1300 06/10/14 2059 06/11/14 0730  NA 139  --  139    K 3.5*  --  4.1  CL 100  --  102  CO2 27  --  26  GLUCOSE 132*  --  87  BUN 22  --  15  CREATININE 0.90  --  0.74  CALCIUM 10.1  --  8.9  MG  --   --  2.3  AST  --  10 11  ALT  --  12 12  ALKPHOS  --  93 94  BILITOT  --  0.3 1.0   ------------------------------------------------------------------------------------------------------------------ estimated creatinine clearance is 36.8 ml/min (by C-G formula based on Cr of 0.74). ------------------------------------------------------------------------------------------------------------------ No results found for this basename: HGBA1C,  in the last 72 hours ------------------------------------------------------------------------------------------------------------------ No results found for this basename: CHOL, HDL, LDLCALC, TRIG, CHOLHDL, LDLDIRECT,  in the last 72 hours ------------------------------------------------------------------------------------------------------------------  Recent Labs  06/11/14 1250  TSH 1.740   ------------------------------------------------------------------------------------------------------------------  Recent Labs  06/10/14 2059 06/11/14 0730  VITAMINB12 399 400  FOLATE >20.0 16.9  FERRITIN 6* 6*  TIBC 396 371  IRON 149* 148*  RETICCTPCT 1.3 0.8    Coagulation profile  Recent Labs Lab 06/10/14 2059  INR 1.11    No results found for this basename: DDIMER,  in the last 72 hours  Cardiac Enzymes  Recent Labs Lab 06/10/14 2052 06/11/14 0730 06/11/14 0733  TROPONINI <0.30 <0.30 <0.30   ------------------------------------------------------------------------------------------------------------------ No components found with this basename: POCBNP,      Time Spent in minutes  35   Marguarite Markov K M.D on 06/12/2014 at 10:23 AM  Between 7am to 7pm - Pager - [phone]  After 7pm go to [email or web address] - password [email or web address]  And look for the night coverage person covering for  me after hours  [facility] Office  [phone]   **Disclaimer: This note may have been dictated with voice recognition software. Similar sounding words can inadvertently be transcribed and this note may contain transcription errors which may not have been corrected upon publication of note.**

## 2014-06-12 NOTE — Consult Note (Signed)
  Assessment: Insufficiency incomplete fractures of both femoral necks and left superior acetabulum  Plan: I again discussed her complex situation with her daughter [name].  After consideration of the options and complexities involved, she presented them to her mother, and together they have decided to proceed with surgical stabilization of her right proximal femur incomplete fracture.  I have made her NPO after MN for surgery late in the afternoon on Tuesday.  Cliffton Asters Janee Morn, MD      Orthopaedic & Hand Surgery [facility] Orthopaedic & Sports Medicine [facility] [address] [place], [place]  [address] Office: [phone] Mobile: [phone]

## 2014-06-13 ENCOUNTER — Encounter (HOSPITAL_COMMUNITY)
Admission: EM | Disposition: A | Discharge status: Skilled Nursing Facility | Payer: Self-pay | Source: Home / Self Care | Attending: Internal Medicine

## 2014-06-13 ENCOUNTER — Inpatient Hospital Stay (HOSPITAL_COMMUNITY): Payer: Medicare Other | Admitting: Anesthesiology

## 2014-06-13 ENCOUNTER — Encounter (HOSPITAL_COMMUNITY): Payer: Self-pay | Admitting: Anesthesiology

## 2014-06-13 ENCOUNTER — Encounter (HOSPITAL_COMMUNITY): Payer: Medicare Other | Admitting: Anesthesiology

## 2014-06-13 ENCOUNTER — Inpatient Hospital Stay (HOSPITAL_COMMUNITY): Payer: Medicare Other

## 2014-06-13 HISTORY — PX: INTRAMEDULLARY (IM) NAIL INTERTROCHANTERIC: SHX5875

## 2014-06-13 LAB — CBC WITH DIFFERENTIAL/PLATELET
BASOS ABS: 0 10*3/uL (ref 0.0–0.1)
Basophils Relative: 0 % (ref 0–1)
EOS ABS: 0.2 10*3/uL (ref 0.0–0.7)
EOS PCT: 2 % (ref 0–5)
HEMATOCRIT: 30.6 % — AB (ref 36.0–46.0)
Hemoglobin: 9.8 g/dL — ABNORMAL LOW (ref 12.0–15.0)
LYMPHS ABS: 1 10*3/uL (ref 0.7–4.0)
Lymphocytes Relative: 10 % — ABNORMAL LOW (ref 12–46)
MCH: 22.6 pg — AB (ref 26.0–34.0)
MCHC: 32 g/dL (ref 30.0–36.0)
MCV: 70.7 fL — AB (ref 78.0–100.0)
Monocytes Absolute: 1 10*3/uL (ref 0.1–1.0)
Monocytes Relative: 10 % (ref 3–12)
Neutro Abs: 8 10*3/uL — ABNORMAL HIGH (ref 1.7–7.7)
Neutrophils Relative %: 78 % — ABNORMAL HIGH (ref 43–77)
PLATELETS: 345 10*3/uL (ref 150–400)
RBC: 4.33 MIL/uL (ref 3.87–5.11)
RDW: 20.9 % — AB (ref 11.5–15.5)
WBC: 10.1 10*3/uL (ref 4.0–10.5)

## 2014-06-13 LAB — SURGICAL PCR SCREEN
MRSA, PCR: NEGATIVE
Staphylococcus aureus: NEGATIVE

## 2014-06-13 LAB — BASIC METABOLIC PANEL
Anion gap: 10 (ref 5–15)
BUN: 16 mg/dL (ref 6–23)
CALCIUM: 9.7 mg/dL (ref 8.4–10.5)
CO2: 27 meq/L (ref 19–32)
Chloride: 102 mEq/L (ref 96–112)
Creatinine, Ser: 0.68 mg/dL (ref 0.50–1.10)
GFR calc Af Amer: 85 mL/min — ABNORMAL LOW (ref >90)
GFR calc non Af Amer: 73 mL/min — ABNORMAL LOW (ref >90)
GLUCOSE: 98 mg/dL (ref 70–99)
Potassium: 3.8 mEq/L (ref 3.7–5.3)
Sodium: 139 mEq/L (ref 137–147)

## 2014-06-13 SURGERY — FIXATION, FRACTURE, INTERTROCHANTERIC, WITH INTRAMEDULLARY ROD
Anesthesia: General | Site: Hip | Laterality: Right

## 2014-06-13 MED ORDER — PHENOL 1.4 % MT LIQD
1.0000 | OROMUCOSAL | Status: DC | PRN
  Filled 2014-06-13 (×2): qty 177

## 2014-06-13 MED ORDER — FENTANYL CITRATE 0.05 MG/ML IJ SOLN
INTRAMUSCULAR | Status: AC
Start: 2014-06-13 — End: 2014-06-13
  Filled 2014-06-13: qty 5

## 2014-06-13 MED ORDER — OXYCODONE HCL 5 MG PO TABS: 5.0000 mg | ORAL_TABLET | Freq: Once | ORAL | Status: DC | PRN

## 2014-06-13 MED ORDER — 0.9 % SODIUM CHLORIDE (POUR BTL) OPTIME
TOPICAL | Status: DC | PRN
  Administered 2014-06-13: 1000 mL

## 2014-06-13 MED ORDER — SUCCINYLCHOLINE CHLORIDE 20 MG/ML IJ SOLN
INTRAMUSCULAR | Status: DC | PRN
  Administered 2014-06-13: 60 mg via INTRAVENOUS

## 2014-06-13 MED ORDER — PROPOFOL 10 MG/ML IV BOLUS
INTRAVENOUS | Status: DC | PRN
  Administered 2014-06-13 (×2): 50 mg via INTRAVENOUS
  Administered 2014-06-13: 30 mg via INTRAVENOUS

## 2014-06-13 MED ORDER — GLYCOPYRROLATE 0.2 MG/ML IJ SOLN
INTRAMUSCULAR | Status: DC | PRN
  Administered 2014-06-13 (×2): 0.2 mg via INTRAVENOUS

## 2014-06-13 MED ORDER — FENTANYL CITRATE 0.05 MG/ML IJ SOLN
INTRAMUSCULAR | Status: DC | PRN
  Administered 2014-06-13: 50 ug via INTRAVENOUS
  Administered 2014-06-13 (×2): 75 ug via INTRAVENOUS

## 2014-06-13 MED ORDER — GLYCOPYRROLATE 0.2 MG/ML IJ SOLN
INTRAMUSCULAR | Status: AC
  Filled 2014-06-13: qty 1

## 2014-06-13 MED ORDER — ARTIFICIAL TEARS OP OINT
TOPICAL_OINTMENT | OPHTHALMIC | Status: DC | PRN
  Administered 2014-06-13: 1 via OPHTHALMIC

## 2014-06-13 MED ORDER — POLYETHYLENE GLYCOL 3350 17 G PO PACK
17.0000 g | PACK | Freq: Every day | ORAL | Status: DC
  Administered 2014-06-14 – 2014-06-15 (×2): 17 g via ORAL
  Filled 2014-06-13 (×2): qty 1

## 2014-06-13 MED ORDER — FENTANYL CITRATE 0.05 MG/ML IJ SOLN
25.0000 ug | INTRAMUSCULAR | Status: DC | PRN
  Administered 2014-06-13 (×2): 50 ug via INTRAVENOUS

## 2014-06-13 MED ORDER — MORPHINE SULFATE 2 MG/ML IJ SOLN
1.0000 mg | INTRAMUSCULAR | Status: DC | PRN
  Administered 2014-06-13 – 2014-06-15 (×4): 1 mg via INTRAVENOUS
  Filled 2014-06-13 (×4): qty 1

## 2014-06-13 MED ORDER — FENTANYL CITRATE 0.05 MG/ML IJ SOLN
INTRAMUSCULAR | Status: AC
  Filled 2014-06-13: qty 2

## 2014-06-13 MED ORDER — ACETAMINOPHEN 325 MG PO TABS: 325.0000 mg | ORAL_TABLET | ORAL | Status: DC | PRN

## 2014-06-13 MED ORDER — ONDANSETRON HCL 4 MG/2ML IJ SOLN
INTRAMUSCULAR | Status: DC | PRN
  Administered 2014-06-13: 4 mg via INTRAVENOUS

## 2014-06-13 MED ORDER — DEXTROSE 5 % IV SOLN
10.0000 mg | INTRAVENOUS | Status: DC | PRN
  Administered 2014-06-13: 100 ug/min via INTRAVENOUS

## 2014-06-13 MED ORDER — LACTATED RINGERS IV SOLN
INTRAVENOUS | Status: DC | PRN
  Administered 2014-06-13: 17:00:00 via INTRAVENOUS

## 2014-06-13 MED ORDER — LIDOCAINE HCL (CARDIAC) 20 MG/ML IV SOLN
INTRAVENOUS | Status: DC | PRN
  Administered 2014-06-13: 60 mg via INTRAVENOUS

## 2014-06-13 MED ORDER — LACTATED RINGERS IV SOLN
INTRAVENOUS | Status: DC
  Administered 2014-06-13: 16:00:00 via INTRAVENOUS

## 2014-06-13 MED ORDER — MENTHOL 3 MG MT LOZG
1.0000 | LOZENGE | OROMUCOSAL | Status: DC | PRN
Start: 2014-06-13 — End: 2014-06-15
  Filled 2014-06-13 (×2): qty 9

## 2014-06-13 MED ORDER — OXYCODONE HCL 5 MG/5ML PO SOLN: 5.0000 mg | Freq: Once | ORAL | Status: DC | PRN

## 2014-06-13 MED ORDER — ACETAMINOPHEN 160 MG/5ML PO SOLN
325.0000 mg | ORAL | Status: DC | PRN
  Filled 2014-06-13: qty 20.3

## 2014-06-13 SURGICAL SUPPLY — 28 items
BNDG COHESIVE 4X5 TAN STRL (GAUZE/BANDAGES/DRESSINGS) ×3 IMPLANT
COVER MAYO STAND STRL (DRAPES) ×3 IMPLANT
COVER PERINEAL POST (MISCELLANEOUS) ×3 IMPLANT
DRAPE STERI IOBAN 125X83 (DRAPES) ×3 IMPLANT
DRAPE U-SHAPE 47X51 STRL (DRAPES) ×3 IMPLANT
DRSG MEPILEX BORDER 4X4 (GAUZE/BANDAGES/DRESSINGS) ×6 IMPLANT
DRSG PAD ABDOMINAL 8X10 ST (GAUZE/BANDAGES/DRESSINGS) ×3 IMPLANT
DURAPREP 26ML APPLICATOR (WOUND CARE) ×3 IMPLANT
ELECT REM PT RETURN 9FT ADLT (ELECTROSURGICAL) ×3
ELECTRODE REM PT RTRN 9FT ADLT (ELECTROSURGICAL) ×1 IMPLANT
GAUZE SPONGE 4X4 12PLY STRL (GAUZE/BANDAGES/DRESSINGS) ×3 IMPLANT
GLOVE BIO SURGEON STRL SZ7.5 (GLOVE) ×3 IMPLANT
GLOVE BIOGEL PI IND STRL 8 (GLOVE) ×1 IMPLANT
GLOVE BIOGEL PI INDICATOR 8 (GLOVE) ×2
GUIDEWIRE BALL NOSE 100CM (WIRE) ×3 IMPLANT
KIT BASIN OR (CUSTOM PROCEDURE TRAY) ×3 IMPLANT
LINER BOOT UNIVERSAL DISP (MISCELLANEOUS) ×3 IMPLANT
NAIL HIP FRA AFFIX 130X9X380 L (Nail) ×3 IMPLANT
PACK GENERAL/GYN (CUSTOM PROCEDURE TRAY) ×3 IMPLANT
PAD CAST 4YDX4 CTTN HI CHSV (CAST SUPPLIES) ×1 IMPLANT
PADDING CAST COTTON 4X4 STRL (CAST SUPPLIES) ×2
PIN GUIDE 3.2 903003004 (MISCELLANEOUS) ×3 IMPLANT
SCREW LAG HIP NAIL 10.5X95 (Screw) ×3 IMPLANT
STAPLER VISISTAT 35W (STAPLE) ×3 IMPLANT
SUT VIC AB 2-0 CT1 27 (SUTURE)
SUT VIC AB 2-0 CT1 27XBRD (SUTURE) IMPLANT
SUT VIC AB 2-0 FS1 27 (SUTURE) ×3 IMPLANT
TOWEL OR 17X26 10 PK STRL BLUE (TOWEL DISPOSABLE) ×3 IMPLANT

## 2014-06-13 NOTE — Anesthesia Procedure Notes (Signed)
Procedure Name: Intubation Date/Time: 06/13/2014 4:45 PM Performed by: Leonel Ramsay Pre-anesthesia Checklist: Patient identified, Timeout performed, Emergency Drugs available, Suction available and Patient being monitored Patient Re-evaluated:Patient Re-evaluated prior to inductionOxygen Delivery Method: Circle system utilized Preoxygenation: Pre-oxygenation with 100% oxygen Intubation Type: IV induction Ventilation: Mask ventilation without difficulty Laryngoscope Size: Mac and 3 Grade View: Grade I Tube type: Oral Tube size: 7.0 mm Number of attempts: 1 Airway Equipment and Method: Stylet Placement Confirmation: ETT inserted through vocal cords under direct vision,  positive ETCO2 and breath sounds checked- equal and bilateral Secured at: 21 cm Tube secured with: Tape Dental Injury: Teeth and Oropharynx as per pre-operative assessment

## 2014-06-13 NOTE — Progress Notes (Signed)
Report called to OR  

## 2014-06-13 NOTE — Op Note (Signed)
06/10/2014 - 06/13/2014  5:50 PM  PATIENT:  [name]  80 y.o. female  PRE-OPERATIVE DIAGNOSIS:  Incomplete right intertrochanteric femur fracture  POST-OPERATIVE DIAGNOSIS:  Same  PROCEDURE:  Surgical stabilization of incomplete right subtrochanteric femur fracture with IMN  SURGEON: Cliffton Asters. Janee Morn, MD  PHYSICIAN ASSISTANT: None  ANESTHESIA:  general  SPECIMENS:  None  DRAINS:   None  PREOPERATIVE INDICATIONS:  [name] is a  80 y.o. female with incomplete painful right intertrochanteric femur fracture affecting the calcar.  The risks benefits and alternatives were discussed with the patient preoperatively including but not limited to the risks of infection, bleeding, nerve injury, cardiopulmonary complications, the need for revision surgery, among others, and the patient verbalized understanding and consented to proceed.  OPERATIVE IMPLANTS: Biomet AFFIXUS trochanteric hip fracture nail, 13 by 360, 95 mm lag screw  OPERATIVE PROCEDURE:  After receiving prophylactic antibiotics, the patient was escorted to the operative theatre and placed in a supine position. General anesthesia was administered A surgical "time-out" was performed during which the planned procedure, proposed operative site, and the correct patient identity were compared to the operative consent and agreement confirmed by the circulating nurse according to current facility policy.  She was repositioned on the fracture table with the well leg in the well leg holder, SCDs on both legs, and the affected leg in boot traction without significant traction applied. The right thigh and leg was prepped with DuraPrep and draped in usual sterile fashion with a shower curtain type drape.  The guidepin was introduced percutaneously and advanced to the center of the greater trochanter. Satisfied with this position on both A-P and lateral fluoroscopy, it was advanced into the canal and followed by passage of the entry reamer.  Soft tissue protector was used. The ball-tipped guide rod was then advanced, taken to its depth, and sizes measuring nail and selected. The canal was reamed starting at 12 and half up to 14-1/2 mm was a with slight cortical chatter. 13 mm nail was then placed by hand down the femoral shaft advanced to the proper depth were then a second incision the lateral thigh was made for placement of the lag screw. It was done in standard fashion using fluoroscopic guidance with effort to place it in the center of the head on both AP and lateral projections. The screw was then locked to the nail, with the locking device loosened a quarter turn to allow for derotation but still allow for controlled sliding collapse of necessary. Distal interlocking screw was not placed. In addition the patient was removed and final fluoroscopic images obtained area the wounds were copiously irrigated and skin was closed with 2-0 Vicryl deep dermal buried sutures followed by staples at the skin. Mepilex dressings were applied and she was awakened and taken to recovery in stable condition, breathing spontaneously  DISPOSITION: She'll be returned to the floor to continue convalescence and rehabilitation. She may be weightbearing as tolerated bilaterally.

## 2014-06-13 NOTE — Clinical Social Work Note (Signed)
Bed offers provided to patient/family.   Roddie Mc MSW, [place], [place], [phone]

## 2014-06-13 NOTE — Transfer of Care (Signed)
Immediate Anesthesia Transfer of Care Note  Patient: [name]  Procedure(s) Performed: Procedure(s): Affix Nail/Right (Right)  Patient Location: PACU  Anesthesia Type:General  Level of Consciousness: awake, alert  and oriented  Airway & Oxygen Therapy: Patient Spontanous Breathing and Patient connected to nasal cannula oxygen  Post-op Assessment: Report given to PACU RN and Post -op Vital signs reviewed and stable  Post vital signs: Reviewed and stable  Complications: No apparent anesthesia complications

## 2014-06-13 NOTE — Progress Notes (Signed)
Patient Demographics  [name], is a 80 y.o. female, DOB - 06-14-1919, MVH:[id number]  Admit date - 06/10/2014   Admitting Physician Edsel Petrin, DO  Outpatient Primary MD for the patient is Paruchuri, Janace Hoard, MD  LOS - 3   Chief Complaint  Patient presents with  . 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 Further workup revealed a small subtle right femoral neck fracture. She is due for surgery by orthopedics on 06/13/2014. Her H&H has stabilized after she received 2 units of packed RBC and IV iron. She likely will require placement post surgery.   Subjective:   [name] today has, No headache, No chest pain, No abdominal pain - No Nausea, No new weakness tingling or numbness, No Cough - SOB.   Ongoing right hip pain radiating all the way down to her foot.  Assessment & Plan    1. Right hip and leg pain. Being followed by Dr. Turner Daniels and in the outpatient setting, workup her with CT scan of the right hip and an MRI right hip shows a subtle right hip fracture. For now bedrest. Due for ORIF by orthopedics. She will be at moderate to high risk for adverse cardiopulmonary outcome due to her advanced age.  Cardio-Pulm Risk stratification for surgery and recommendations to minimize the same:-  A.Cardio-Pulmonary Risk -  this patient is a moderate to high for adverse Cardio-Pulmonary  Outcome  from surgery, the risks and benefits were discussed and acceptable to daughter and patient.  Recommendations for optimizing Cardio-Pulmonary  Risk risk factors  1. Keep  SBP<140, HR<85, use Lopressor  IV q4hrs PRN, or B.Blocker drip PRN. 2. Moniotr I&Os. 3. Minimal sedation and Narcotics. 4. Good pulmunary toilet. 5. PRN Nebs and as needed oxygen to keep Pox>90% 6. Hb>8, transfuse as needed- Lasix  IV after each unit PRBC Transfused.   B.Bleeding Risk - no previous surgical complications, no easy bruising,  Antiplate meds none   Lab Results  Component Value Date   PLT 345 06/13/2014                  Lab Results  Component Value Date   INR 1.11 06/10/2014   INR 1.00 06/29/2012   INR 1.08 01/16/2012      Will request Surgeon to please Order DVT prophylaxis of his/her choice, along with activity, weight bearing precautions and diet if appropriate.       2. Anemia. Anemia of iron deficiency, stool negative for occult blood, likely intermittent GI bleed, due to advanced age discussed with patient's daughter who agrees with no further workup. She is stable post 2 units of packed RBC transfusion and IV iron.   Plan is to monitor H&H in the outpatient setting along with iron levels, outpatient intermittent transfusion. Not a candidate for colectomy, radiation chemotherapy place on PPI.    3. Isolated one episode of atrial fibrillation in the ER. Resolved, placed on low-dose beta blocker monitor on telemetry. Stable TSH. Echo noted and stable.  EF 65% with mild LVH.     4.Hypertension - placed on Coreg and monitor.    5. UTI. On Rocephin and monitor cultures. Finish Rocephin 06-14-2014     6. Severe bilateral hearing loss. Supportive care.    7. Hypothyroidism continue home dose Synthroid monitor TSH stable.      Code Status: DO NOT RESUSCITATE  Family Communication: Daughter  Disposition Plan: SNF   Procedures 2 unit packed RBC transfusion, CT-guided, MRI right hip, MR L. spine   Consults Ortho   Medications  Scheduled Meds: . carvedilol  3.125 mg Oral BID WC  . cefTRIAXone (ROCEPHIN)  IV  1 g Intravenous Q24H  .  docusate sodium  100 mg Oral BID  . gabapentin  100 mg Oral QHS  . levothyroxine  50 mcg Oral Q0600  . pantoprazole  40 mg Oral Q1200  . senna  1 tablet Oral BID  . sodium chloride  3 mL Intravenous Q12H   Continuous Infusions:  PRN Meds:.acetaminophen, LORazepam, methocarbamol (ROBAXIN) IV, ondansetron (ZOFRAN) IV, oxyCODONE-acetaminophen  DVT Prophylaxis    SCDs    Lab Results  Component Value Date   PLT 345 06/13/2014    Antibiotics   Anti-infectives   Start     Dose/Rate Route Frequency Ordered Stop   06/10/14 1600  cefTRIAXone (ROCEPHIN) 1 g in dextrose 5 % 50 mL IVPB     1 g 100 mL/hr over 30 Minutes Intravenous Every 24 hours 06/10/14 1545     06/10/14 1548  cefTRIAXone (ROCEPHIN) 1 G injection    Comments:  Haskins, Kaila   : cabinet override      06/10/14 1548 06/10/14 1554          Objective:   Filed Vitals:   06/12/14 2007 06/13/14 0005 06/13/14 0408 06/13/14 0809  BP: 155/73 179/64 154/64 144/62  Pulse: 83 76 82 76  Temp: 98.4 F (36.9 C) 98.1 F (36.7 C) 98 F (36.7 C)   TempSrc: Oral Oral Oral   Resp: 18 20 20    Height:      Weight:      SpO2: 94% 93% 94%     Wt Readings from Last 3 Encounters:  06/11/14 53.071 kg (117 lb)  06/11/14 53.071 kg (117 lb)  01/09/12 53.071 kg (117 lb)     Intake/Output Summary (Last 24 hours) at 06/13/14 0925 Last data filed at 06/13/14 0923  Gross per 24 hour  Intake    173 ml  Output   1100 ml  Net   -927 ml     Physical Exam  Awake , extremely hard of hearing, No new F.N deficits, Normal affect [place].AT,PERRAL Supple Neck,No JVD, No cervical lymphadenopathy appriciated.  Symmetrical Chest wall movement, Good air movement bilaterally, CTAB RRR,No Gallops,Rubs or new Murmurs, No Parasternal Heave +ve B.Sounds, Abd Soft, No tenderness, No organomegaly appriciated, No rebound - guarding or rigidity. No Cyanosis, Clubbing or edema, No new Rash or bruise  Right hip range of motion is painful    Data  Review   Micro Results Recent Results (from the past 240 hour(s))  MRSA PCR SCREENING     Status: None   Collection Time    06/10/14  6:54 PM      Result Value Ref Range Status   MRSA by PCR NEGATIVE  NEGATIVE Final   Comment:            The GeneXpert MRSA Assay (FDA     approved for NASAL specimens  only), is one component of a     comprehensive MRSA colonization     surveillance program. It is not     intended to diagnose MRSA     infection nor to guide or     monitor treatment for     MRSA infections.  SURGICAL PCR SCREEN     Status: None   Collection Time    06/13/14 12:02 AM      Result Value Ref Range Status   MRSA, PCR NEGATIVE  NEGATIVE Final   Staphylococcus aureus NEGATIVE  NEGATIVE Final   Comment:            The Xpert SA Assay (FDA     approved for NASAL specimens     in patients over 25 years of age),     is one component of     a comprehensive surveillance     program.  Test performance has     been validated by [employer] for patients greater     than or equal to 83 year old.     It is not intended     to diagnose infection nor to     guide or monitor treatment.    Radiology Reports Ct Hip Right Wo Contrast  06/11/2014   CLINICAL DATA:  Hip pain.  EXAM: CT OF THE RIGHT HIP WITHOUT CONTRAST  TECHNIQUE: Multidetector CT imaging was performed according to the standard protocol. Multiplanar CT image reconstructions were also generated.  COMPARISON:  CT 06/10/2014  FINDINGS: Diffuse osteopenia and degenerative change present. An extremely subtle lucency is noted along the medial aspect of the base of the right femoral neck . Punctate area air is noted within the medullary bone adjacent to this region. These changes may be related to an extremely subtle fracture of the base of the right femoral neck. No displaced fractures noted. Sacrum and SI joints are intact were visualized. Sigmoid colonic diverticulosis. Right inguinal hernia with herniation of fat  only.Peripheral vascular disease.  IMPRESSION: 1. Extremely subtle lucency is noted along the medial aspect of the base of the right femoral neck. Adjacent punctate area of air is noted in the medullary bone. These changes may be related to an extremely subtle fracture of the base of the right femoral neck. If further evaluation is needed MRI can be obtained. 2. Diffuse severe osteopenia and degenerative change.   Electronically Signed   By: Maisie Fus  Register   On: 06/11/2014 11:19   Mr Hip Right Wo Contrast  06/11/2014   CLINICAL DATA:  Persistent right hip pain. No reported acute injury or history of malignancy.  EXAM: MR OF THE RIGHT HIP WITHOUT CONTRAST  TECHNIQUE: Multiplanar, multisequence MR imaging was performed. No intravenous contrast was administered.  COMPARISON:  Right hip CT 06/11/2014, lumbar MRI 06/10/2014, pelvic CT 06/10/2014 and whole body bone scan 12/23/2011.  FINDINGS: Bones: There is a fracture of the proximal right femur extending laterally from the lesser trochanter, corresponding with the recent CT finding. This fracture extends into the intertrochanteric region, but appears incomplete. There is prominent surrounding bone marrow and soft tissue edema. There is a contralateral fracture of the left femoral neck medially with marrow edema, also appearing incomplete. In addition, there is a fracture of the left acetabular roof with a linear component best seen on the coronal images. The sacroiliac joints and symphysis pubis appear normal.  Articular cartilage and labrum  Articular cartilage: There are mild degenerative changes at both  hips. There is no evidence of femoral head avascular necrosis.  Labrum: There is acetabular labral degeneration without paralabral cyst formation.  Joint or bursal effusion  Joint effusion: Small bilateral hip joint effusions.  Bursae: There is no focal periarticular fluid collection. However, there is moderate edema surrounding the proximal right femur  fracture.  Muscles and tendons  Muscles and tendons: The gluteus, hamstring and iliopsoas tendons appear normal. The piriformis muscles appear symmetric.  Other findings  Miscellaneous: Bilateral renal cysts are partially imaged. There are chronic compression deformities within the lower lumbar spine. There is prominent fat within both inguinal canals which appears stable.  IMPRESSION: 1. There are multiple fractures involving the medial intertrochanteric region of the right femur, the left femoral neck medially and the left superior acetabulum. Given the presence of numerous thoracolumbar compression deformities on prior imaging, these are likely insufficiency fractures. No definite pathologic features or other suspicious marrow lesions identified. 2. The right femur fracture appears incomplete, although there is surrounding soft tissue edema. With continued weight-bearing, this is at risk to become a complete fracture.   Electronically Signed   By: Roxy Horseman M.D.   On: 06/11/2014 14:35    CBC  Recent Labs Lab 06/10/14 1300 06/11/14 1110 06/12/14 0555 06/13/14 0501  WBC 9.2  --  10.5 10.1  HGB 6.4* 10.1* 9.8* 9.8*  HCT 21.0* 31.3* 29.6* 30.6*  PLT 405*  --  PLATELET CLUMPS NOTED ON SMEAR, COUNT APPEARS ADEQUATE 345  MCV 65.2*  --  68.2* 70.7*  MCH 19.9*  --  22.6* 22.6*  MCHC 30.5  --  33.1 32.0  RDW 16.3*  --  20.2* 20.9*  LYMPHSABS 0.6*  --  1.1 1.0  MONOABS 1.0  --  0.9 1.0  EOSABS 0.1  --  0.2 0.2  BASOSABS 0.0  --  0.1 0.0    Chemistries   Recent Labs Lab 06/10/14 1300 06/10/14 2059 06/11/14 0730 06/13/14 0501  NA 139  --  139 139  K 3.5*  --  4.1 3.8  CL 100  --  102 102  CO2 27  --  26 27  GLUCOSE 132*  --  87 98  BUN 22  --  15 16  CREATININE 0.90  --  0.74 0.68  CALCIUM 10.1  --  8.9 9.7  MG  --   --  2.3  --   AST  --  10 11  --   ALT  --  12 12  --   ALKPHOS  --  93 94  --   BILITOT  --  0.3 1.0  --     ------------------------------------------------------------------------------------------------------------------ estimated creatinine clearance is 36.8 ml/min (by C-G formula based on Cr of 0.68). ------------------------------------------------------------------------------------------------------------------ No results found for this basename: HGBA1C,  in the last 72 hours ------------------------------------------------------------------------------------------------------------------ No results found for this basename: CHOL, HDL, LDLCALC, TRIG, CHOLHDL, LDLDIRECT,  in the last 72 hours ------------------------------------------------------------------------------------------------------------------  Recent Labs  06/11/14 1250  TSH 1.740   ------------------------------------------------------------------------------------------------------------------  Recent Labs  06/10/14 2059 06/11/14 0730  VITAMINB12 399 400  FOLATE >20.0 16.9  FERRITIN 6* 6*  TIBC 396 371  IRON 149* 148*  RETICCTPCT 1.3 0.8    Coagulation profile  Recent Labs Lab 06/10/14 2059  INR 1.11    No results found for this basename: DDIMER,  in the last 72 hours  Cardiac Enzymes  Recent Labs Lab 06/10/14 2052 06/11/14 0730 06/11/14 0733  TROPONINI <0.30 <0.30 <0.30   ------------------------------------------------------------------------------------------------------------------ No components found  with this basename: POCBNP,      Time Spent in minutes  35   Susa Raring K M.D on 06/13/2014 at 9:25 AM  Between 7am to 7pm - Pager - [phone]  After 7pm go to [email or web address] - password [email or web address]  And look for the night coverage person covering for me after hours  [facility] Office  [phone]   **Disclaimer: This note may have been dictated with voice recognition software. Similar sounding words can inadvertently be transcribed and this note may contain  transcription errors which may not have been corrected upon publication of note.**

## 2014-06-13 NOTE — Anesthesia Preprocedure Evaluation (Addendum)
Anesthesia Evaluation  Patient identified by MRN, date of birth, ID band Patient awake    Reviewed: Allergy & Precautions, H&P , NPO status   History of Anesthesia Complications Negative for: history of anesthetic complications  Airway Mallampati: II TM Distance: >3 FB Neck ROM: Full    Dental  (+) Teeth Intact, Dental Advisory Given   Pulmonary  breath sounds clear to auscultation        Cardiovascular hypertension, Pt. on medications - angina- Past MI and - CHF + dysrhythmias Atrial Fibrillation Rhythm:Regular     Neuro/Psych negative neurological ROS  negative psych ROS   GI/Hepatic negative GI ROS, Neg liver ROS,   Endo/Other  Hypothyroidism   Renal/GU negative Renal ROS     Musculoskeletal   Abdominal   Peds  Hematology  (+) anemia ,   Anesthesia Other Findings Hard of hearing, poor vision  Reproductive/Obstetrics                          Anesthesia Physical Anesthesia Plan  ASA: III  Anesthesia Plan: General   Post-op Pain Management:    Induction: Intravenous  Airway Management Planned: Oral ETT  Additional Equipment: None  Intra-op Plan:   Post-operative Plan: Extubation in OR  Informed Consent: I have reviewed the patients History and Physical, chart, labs and discussed the procedure including the risks, benefits and alternatives for the proposed anesthesia with the patient or authorized representative who has indicated his/her understanding and acceptance.   Dental advisory given  Plan Discussed with: Surgeon and CRNA  Anesthesia Plan Comments:        Anesthesia Quick Evaluation

## 2014-06-13 NOTE — Discharge Instructions (Addendum)
Discharge Instructions -- Lower Extremity Fracture   Elevate your foot to reduce pain & swelling of the toes.  Ice over the operative site may be helpful to reduce pain & swelling.  DO NOT USE HEAT. Pain medicine has been prescribed for you Use your medicine as needed over the first 48 hours, and then you can begin to taper your use. You may take Extra Strength Tylenol or Tylenol only in place of the pain pills.  You may shower, apply a new dressing afterward.  May stop placing a dressing at all once no more active drainage is occurring. You may have already made your follow-up appointment when we completed your preop visit.  If not, please call our office today or the next business day to make your return appointment for 10-15 days after surgery.     Please call [phone] during normal business hours or [phone] after hours for any problems. Including the following:  - excessive redness of the incisions - drainage for more than 4 days - fever of more than 101.5 F  *Please note that pain medications will not be refilled after hours or on weekends.

## 2014-06-14 DIAGNOSIS — S72143A Displaced intertrochanteric fracture of unspecified femur, initial encounter for closed fracture: Secondary | ICD-10-CM

## 2014-06-14 DIAGNOSIS — I10 Essential (primary) hypertension: Secondary | ICD-10-CM

## 2014-06-14 DIAGNOSIS — W19XXXA Unspecified fall, initial encounter: Secondary | ICD-10-CM

## 2014-06-14 LAB — CBC
HEMATOCRIT: 29.1 % — AB (ref 36.0–46.0)
Hemoglobin: 9.2 g/dL — ABNORMAL LOW (ref 12.0–15.0)
MCH: 22.2 pg — ABNORMAL LOW (ref 26.0–34.0)
MCHC: 31.6 g/dL (ref 30.0–36.0)
MCV: 70.1 fL — ABNORMAL LOW (ref 78.0–100.0)
Platelets: 320 10*3/uL (ref 150–400)
RBC: 4.15 MIL/uL (ref 3.87–5.11)
RDW: 21.7 % — ABNORMAL HIGH (ref 11.5–15.5)
WBC: 12.6 10*3/uL — ABNORMAL HIGH (ref 4.0–10.5)

## 2014-06-14 LAB — BASIC METABOLIC PANEL
Anion gap: 10 (ref 5–15)
BUN: 17 mg/dL (ref 6–23)
CO2: 27 mEq/L (ref 19–32)
Calcium: 9 mg/dL (ref 8.4–10.5)
Chloride: 102 mEq/L (ref 96–112)
Creatinine, Ser: 0.67 mg/dL (ref 0.50–1.10)
GFR, EST AFRICAN AMERICAN: 85 mL/min — AB (ref >90)
GFR, EST NON AFRICAN AMERICAN: 73 mL/min — AB (ref >90)
Glucose, Bld: 105 mg/dL — ABNORMAL HIGH (ref 70–99)
Potassium: 3.9 mEq/L (ref 3.7–5.3)
SODIUM: 139 meq/L (ref 137–147)

## 2014-06-14 MED ORDER — OXYCODONE-ACETAMINOPHEN 5-325 MG PO TABS: 1.0000 | ORAL_TABLET | ORAL | Status: DC | PRN

## 2014-06-14 NOTE — Clinical Social Work Note (Signed)
SNF bed offers have been given to patient's daughter. Daughter is requesting that patient be screened for CIR admission. CSW has asked daughter to be prepared to make choice of offers received if CIR is not an option for patient. CSW will continue to follow.  Roddie Mc MSW, [place], [place], [phone]

## 2014-06-14 NOTE — Progress Notes (Addendum)
Subjective: 06-13-14: IMN R IT incomplete femur fx Sitting up, eating, and conversant, but with limitations due to poor vision and hearing   Objective: Vital signs in last 24 hours: Temp:  [97.4 F (36.3 C)-98.9 F (37.2 C)] 98.3 F (36.8 C) (09/02 1427) Pulse Rate:  [63-95] 74 (09/02 1636) Resp:  [13-18] 16 (09/02 1427) BP: (103-155)/(50-81) 123/50 mmHg (09/02 1636) SpO2:  [93 %-100 %] 93 % (09/02 1427)  Intake/Output from previous day: 09/01 0701 - 09/02 0700 In: 1043 [P.O.:240; I.V.:803] Out: 775 [Urine:675; Blood:100] Intake/Output this shift: Total I/O In: 50 [IV Piggyback:50] Out: 250 [Urine:250]   Recent Labs  06/12/14 0555 06/13/14 0501 06/14/14 0545  HGB 9.8* 9.8* 9.2*    Recent Labs  06/13/14 0501 06/14/14 0545  WBC 10.1 12.6*  RBC 4.33 4.15  HCT 30.6* 29.1*  PLT 345 320    Recent Labs  06/13/14 0501 06/14/14 0545  NA 139 139  K 3.8 3.9  CL 102 102  CO2 27 27  BUN 16 17  CREATININE 0.68 0.67  GLUCOSE 98 105*  CALCIUM 9.7 9.0   No results found for this basename: LABPT, INR,  in the last 72 hours  Dressings with scant dried drainage, NVI  Assessment/Plan: PM&R recommends ST SNF. May d/c anytime from Ortho POV I recommend only mechanical VTE prophylaxis given the patient's anemia of undetermined etiology. D/C dressings on POD 4, re-dress only if draining. RTC approx 2 weeks postop.    Evona Westra A. 06/14/2014, 5:49 PM

## 2014-06-14 NOTE — Progress Notes (Signed)
OT Cancellation Note  Patient Details Name: MYKA HITZ MRN: [id number] DOB: Dec 19, 1918   Cancelled Treatment:    Reason Eval/Treat Not Completed: Other (comment) Pt in bed rubbing L thigh stating "I know i shouldn't have any more medicine- it just hurts and hurts" RN did just give pt pain meds. No family present and pt could not hear OT- even with OT raising voice very loud.  Will recheck on pt next day when family member present.    Alba Cory 06/14/2014, 12:31 PM

## 2014-06-14 NOTE — Progress Notes (Signed)
Physical medicine and rehabilitation consult requested after right intertrochanteric femur fracture. Await physical and occupational therapy evaluations. As per clinical social worker notes patient does not have 24/7 hour assistance at home and feels skilled nursing facility may be needed. Await evaluations by therapy team and recommendations and then followup with formal rehabilitation consult if needed

## 2014-06-14 NOTE — Progress Notes (Signed)
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 Further workup revealed a small subtle right femoral neck fracture. She is due for surgery by orthopedics on 06/13/2014. Her H&H has stabilized after she received 2 units of packed RBC and IV iron. She likely will require placement post surgery.  Assessment/Plan: Intertrochanteric fracture of right hip - S/p ORIF 9.1.2015. - PT consulted recommended CIR.  Anemia of iron deficiency: - Stool negative for occult blood, likely intermittent GI bleed, - Discussed with patient's daughter who agrees with no further workup.  - S/p  2 units of packed RBC transfusion and IV iron  Paroxismal atrial fibrillation in the ER.  - Resolved, placed on low-dose beta blocker monitor on telemetry  Hypertension: - placed on Coreg and monitor.   UTI: - On Rocephin and monitor cultures.  - Finish Rocephin 06-14-2014. - No UC done.   Code Status: full Family Communication: none  Disposition Plan: inpatient   Consultants:  [facility]  Procedures:  ORIF 9.1.2015  Antibiotics:  Rocpehin 8.31.2015-9.2.2015  HPI/Subjective: Complaining of pain  Objective: Filed Vitals:   06/14/14 0505 06/14/14 0802 06/14/14 0812 06/14/14 1040  BP: 120/55 138/57  103/66  Pulse: 69 77  71  Temp: 98.6 F (37 C)   98.9 F (37.2 C)  TempSrc: Oral   Oral  Resp: 16   16  Height:      Weight:      SpO2: 95%  97% 94%    Intake/Output Summary (Last 24 hours) at 06/14/14 1348 Last data filed at 06/14/14 0730  Gross per 24 hour  Intake   1040 ml  Output    575 ml  Net    465 ml   Filed Weights   06/10/14 1120 06/10/14 2022 06/11/14 1700  Weight: 53.071 kg (117 lb) 51 kg (112 lb 7 oz) 53.071 kg (117 lb)    Exam:  General:  Alert, awake, oriented x3, in no acute distress.  HEENT: No bruits, no goiter.  Heart: Regular rate and rhythm.  Lungs: Good air movement, clear Abdomen: Soft, nontender, nondistended, positive bowel sounds.   Data Reviewed: Basic Metabolic Panel:  Recent Labs Lab 06/10/14 1300 06/11/14 0730 06/13/14 0501 06/14/14 0545  NA 139 139 139 139  K 3.5* 4.1 3.8 3.9  CL 100 102 102 102  CO2 GLUCOSE 132* 87 98 105*  BUN CREATININE 0.90 0.74 0.68 0.67  CALCIUM 10.1 8.9 9.7 9.0  MG  --  2.3  --   --   PHOS  --  3.0  --   --    Liver Function Tests:  Recent Labs Lab 06/10/14 2059 06/11/14 0730  AST 10 11  ALT 12 12  ALKPHOS 93 94  BILITOT 0.3 1.0  PROT 5.7* 5.8*  ALBUMIN 3.1* 3.1*   No results found for this basename: LIPASE, AMYLASE,  in the last 168 hours No results found for this basename: AMMONIA,  in the last 168 hours CBC:  Recent Labs Lab 06/10/14 1300 06/11/14 1110 06/12/14 0555 06/13/14 0501 06/14/14 0545  WBC 9.2  --  10.5 10.1 12.6*  NEUTROABS 7.5  --  8.2* 8.0*  --   HGB 6.4* 10.1* 9.8* 9.8* 9.2*  HCT 21.0* 31.3* 29.6* 30.6* 29.1*  MCV 65.2*  --  68.2* 70.7* 70.1*  PLT 405*  --  PLATELET CLUMPS NOTED ON SMEAR, COUNT APPEARS ADEQUATE 345 320   Cardiac Enzymes:  Recent Labs Lab 06/10/14 1300 06/10/14 2052 06/11/14 0730 06/11/14 0733  TROPONINI <0.30 <0.30 <0.30 <0.30   BNP (last 3 results) No results found for this basename: PROBNP,  in the last 8760 hours CBG:  Recent Labs Lab 06/10/14 2019  GLUCAP 164*    Recent Results (from the past 240 hour(s))  MRSA PCR SCREENING     Status: None   Collection Time    06/10/14  6:54 PM      Result Value Ref Range Status   MRSA by PCR NEGATIVE  NEGATIVE Final   Comment:            The GeneXpert MRSA Assay (FDA     approved for NASAL specimens     only), is one component of a     comprehensive MRSA colonization     surveillance program. It is not     intended to  diagnose MRSA     infection nor to guide or     monitor treatment for     MRSA infections.  SURGICAL PCR SCREEN     Status: None   Collection Time    2014-07-07 12:02 AM      Result Value Ref Range Status   MRSA, PCR NEGATIVE  NEGATIVE Final   Staphylococcus aureus NEGATIVE  NEGATIVE Final   Comment:            The Xpert SA Assay (FDA     approved for NASAL specimens     in patients over 28 years of age),     is one component of     a comprehensive surveillance     program.  Test performance has     been validated by [employer] for patients greater     than or equal to 6 year old.     It is not intended     to diagnose infection nor to     guide or monitor treatment.     Studies: Dg Hip Operative Right  2014-07-07   CLINICAL DATA:  ORIF right hip  EXAM: DG OPERATIVE RIGHT HIP  TECHNIQUE: A series of 4 intraoperative fluoroscopic spot images of the right hip is submitted.  COMPARISON:  MR 06/11/2014  FINDINGS: IM rod and sliding screw placement across the right femoral neck. Patchy femoral-popliteal arterial calcifications.  IMPRESSION: 1. Internal fixation across right femoral neck.   Electronically Signed   By: Oley Balm M.D.   On: 07-07-14 18:10    Scheduled Meds: . carvedilol  3.125 mg Oral BID WC  . cefTRIAXone (ROCEPHIN)  IV  1 g Intravenous Q24H  . docusate sodium  100 mg Oral BID  . levothyroxine  50 mcg Oral Q0600  . pantoprazole  40 mg Oral Q1200  . polyethylene glycol  17 g Oral Daily  . senna  1 tablet Oral BID   Continuous Infusions:    Marinda Elk  [facility] Pager [phone]. If 8PM-8AM, please contact night-coverage at [email or web address], password [facility] 06/14/2014, 1:48 PM  LOS: 4 days      **Disclaimer: This note may have been dictated with voice recognition software. Similar sounding words can inadvertently be transcribed and this note may contain transcription errors which may not have been corrected upon publication of  note.**

## 2014-06-14 NOTE — Anesthesia Postprocedure Evaluation (Signed)
  Anesthesia Post-op Note  Patient: [name]  Procedure(s) Performed: Procedure(s): Affix Nail/Right (Right)  Patient Location: PACU  Anesthesia Type:General  Level of Consciousness: awake  Airway and Oxygen Therapy: Patient Spontanous Breathing and Patient connected to nasal cannula oxygen  Post-op Pain: mild  Post-op Assessment: Post-op Vital signs reviewed, Patient's Cardiovascular Status Stable, Respiratory Function Stable, Patent Airway, No signs of Nausea or vomiting and Pain level controlled  Post-op Vital Signs: Reviewed and stable  Last Vitals:  Filed Vitals:   06/14/14 1040  BP: 103/66  Pulse: 71  Temp: 37.2 C  Resp: 16    Complications: No apparent anesthesia complications

## 2014-06-14 NOTE — Consult Note (Signed)
Physical Medicine and Rehabilitation Consult Reason for Consult: Right intertrochanteric femur fracture Referring Physician: [facility]   HPI: [name] is a 80 y.o. right-handed female with history of hypertension, hard of hearing and poor vision. Patient had been living alone independent with assistive device. Patient presented initially to orthopedic office due to right hip pain that steadily progressed x3 weeks and difficulty bearing weight. Admitted 06/11/2014 after MRI completed that showed incomplete fractures of both femoral necks as well as left superior acetabulum are thought to be insufficiency fractures as well as anemia 6.4and transfuse. Stool negative for occult blood in the ED. Anemia felt to be iron deficiency and family requested no further workup . Underwent surgical stabilization of incomplete right subtrochanteric femur fracture with IM nailing 06/13/2014 per Dr. Mack Hook. Hospital course pain management. Touch down weight bearing right lower extremity. Empiric Rocephin for suspect urinary tract infection. Physical therapy evaluation completed. Plan is for rehabilitation consult at family request.   Review of Systems  HENT: Positive for hearing loss.   Eyes: Positive for blurred vision.  Musculoskeletal: Positive for joint pain and myalgias.  All other systems reviewed and are negative.  Past Medical History  Diagnosis Date  . Thyroid disease   . Hypertension   . Cannot hear   . Vision loss    Past Surgical History  Procedure Laterality Date  . Cesarean section    . Abdominal hysterectomy    . Kyphoplasty     Family History  Problem Relation Age of Onset  . Breast cancer Sister   . Cancer Brother   . Liver disease Son   . Lung cancer Son    Social History:  reports that she has never smoked. She does not have any smokeless tobacco history on file. She reports that she does not drink alcohol or use illicit drugs. Allergies:  Allergies  Allergen  Reactions  . Aspirin Other (See Comments)    Nose bleeds.    Medications Prior to Admission  Medication Sig Dispense Refill  . acetaminophen-codeine (TYLENOL #3) 300-30 MG per tablet Take 1 tablet by mouth every 6 (six) hours as needed for moderate pain.      . cholecalciferol (VITAMIN D) 1000 UNITS tablet Take 3,000 Units by mouth daily.      Marland Kitchen levothyroxine (SYNTHROID, LEVOTHROID) 50 MCG tablet Take 50 mcg by mouth every morning.       Marland Kitchen losartan (COZAAR) 50 MG tablet Take 50 mg by mouth every morning.       . triamterene-hydrochlorothiazide (DYAZIDE) 37.5-25 MG per capsule Take 1 capsule by mouth every morning.       Marland Kitchen HYDROCODONE-GUAIFENESIN PO Take 5 mLs by mouth every 6 (six) hours as needed. For cough      . traMADol (ULTRAM) 50 MG tablet Take 50 mg by mouth every 12 (twelve) hours.        Home: Home Living Family/patient expects to be discharged to:: Inpatient rehab Living Arrangements: Alone  Functional History: Prior Function Level of Independence: Independent with assistive device(s) Gait / Transfers Assistance Needed: Reports Mod I with RW, no falls. ADL's / Homemaking Assistance Needed: Pt reports her daughter assists with medication management, grocery shopping and driving.  Does sponge bathes at home. Comments: Pt very HOH, does not wear hearing aides. Legally blind per MD note. Pt with macular degeneration of right eye and poor vision left eye. Glasses are at home. Functional Status:  Mobility: Bed Mobility Overal bed mobility: Needs  Assistance Bed Mobility: Rolling;Sidelying to Sit Rolling: Supervision Sidelying to sit: [facility] elevated;Min assist General bed mobility comments: Use of rail to assist with elevating trunk. Min assist to steady trunk once EOB. VC for technique. Transfers Overall transfer level: Needs assistance Equipment used: Rolling walker (2 wheeled) Transfers: Sit to/from [employer] Sit to Stand: Min guard Stand pivot transfers:  Min assist Squat pivot transfers: Min assist General transfer comment: Able to stand x1 from EOB, x1 from recliner, Min guard for safety and balance due to pt reluctant to place weight through RLE. Able to take a few pivotal steps to chair with assist for weightshifting to advance LLE. Ambulation/Gait Ambulation/Gait assistance: Min assist Ambulation Distance (Feet): 6 Feet Assistive device: Rolling walker (2 wheeled) Gait Pattern/deviations: Step-to pattern;Decreased stance time - right;Decreased step length - left;Trunk flexed;Narrow base of support Gait velocity: Decreased General Gait Details: Able to take a few steps forward and retro gait with Min A for weight shifting and assist negotiating RW. Very anxious about placing weight through RLE. Sa02 remained >92% on 02. 02 removed to blow nose at rest and Sa02 dropped to 88%.    ADL:    Cognition: Cognition Overall Cognitive Status: History of cognitive impairments - at baseline Orientation Level: Oriented X4 Cognition Arousal/Alertness: Awake/alert Behavior During Therapy: Anxious Overall Cognitive Status: History of cognitive impairments - at baseline Area of Impairment: Following commands;Orientation Orientation Level: Disoriented to;Time (able to state she had surgery yesterday.) Following Commands: Follows one step commands with increased time General Comments: Requires constant repetition to perform tasks due to hearing impairment. Perseverating on wanting to eat breakfast throughout session.  Blood pressure 103/66, pulse 71, temperature 98.9 F (37.2 C), temperature source Oral, resp. rate 16, height  (1.651 m), weight 53.071 kg (117 lb), SpO2 94.00%. Physical Exam  Vitals reviewed. HENT:  Head: Normocephalic.  Eyes:  Pupils reactive to light   Neck: Normal range of motion. Neck supple. No thyromegaly present.  Cardiovascular:   Cardiac rate controlled  Respiratory: Effort normal and breath sounds normal. No  respiratory distress.  GI: Soft. Bowel sounds are normal. She exhibits no distension.  Neurological: She is alert.  Patient very hard of hearing which limited exam. She makes good eye contact with examiner but vision limited. She follows simple commands. She answered most questions appropriately when she was able to hear me. UES grossly 4/5. LLE:  3/5 prox to 4/5 distally. RLE limited due to pain.   Skin:  Surgical incision site clean and dry    Results for orders placed during the hospital encounter of 06/10/14 (from the past 24 hour(s))  CBC     Status: Abnormal   Collection Time    06/14/14  5:45 AM      Result Value Ref Range   WBC 12.6 (*) 4.0 - 10.5 K/uL   RBC 4.15  3.87 - 5.11 MIL/uL   Hemoglobin 9.2 (*) 12.0 - 15.0 g/dL   HCT 16.1 (*) 09.6 - 04.5 %   MCV 70.1 (*) 78.0 - 100.0 fL   MCH 22.2 (*) 26.0 - 34.0 pg   MCHC 31.6  30.0 - 36.0 g/dL   RDW 40.9 (*) 81.1 - 91.4 %   Platelets 320  150 - 400 K/uL  BASIC METABOLIC PANEL     Status: Abnormal   Collection Time    06/14/14  5:45 AM      Result Value Ref Range   Sodium 139  137 - 147 mEq/L  Potassium 3.9  3.7 - 5.3 mEq/L   Chloride 102  96 - 112 mEq/L   CO2 27  19 - 32 mEq/L   Glucose, Bld 105 (*) 70 - 99 mg/dL   BUN 17  6 - 23 mg/dL   Creatinine, Ser 1.19  0.50 - 1.10 mg/dL   Calcium 9.0  8.4 - 14.7 mg/dL   GFR calc non Af Amer 73 (*) >90 mL/min   GFR calc Af Amer 85 (*) >90 mL/min   Anion gap 10  5 - 15   Dg Hip Operative Right  06/13/2014   CLINICAL DATA:  ORIF right hip  EXAM: DG OPERATIVE RIGHT HIP  TECHNIQUE: A series of 4 intraoperative fluoroscopic spot images of the right hip is submitted.  COMPARISON:  MR 06/11/2014  FINDINGS: IM rod and sliding screw placement across the right femoral neck. Patchy femoral-popliteal arterial calcifications.  IMPRESSION: 1. Internal fixation across right femoral neck.   Electronically Signed   By: Oley Balm M.D.   On: 06/13/2014 18:10    Assessment/Plan: Diagnosis:  numerous insufficiency fx's including right IT hip fx 1. Does the need for close, 24 hr/day medical supervision in concert with the patient's rehab needs make it unreasonable for this patient to be served in a less intensive setting? No 2. Co-Morbidities requiring supervision/potential complications: afib, pain, hoh, poor vision 3. Due to bladder management, bowel management, safety, skin/wound care, disease management, medication administration, pain management and patient education, does the patient require 24 hr/day rehab nursing? No 4. Does the patient require coordinated care of a physician, rehab nurse, PT, OT to address physical and functional deficits in the context of the above medical diagnosis(es)? No Addressing deficits in the following areas: balance, endurance, locomotion, strength, bowel/bladder control, bathing, dressing and grooming 5. Can the patient actively participate in an intensive therapy program of at least 3 hrs of therapy per day at least 5 days per week? No 6. The potential for patient to make measurable gains while on inpatient rehab is fair 7. Anticipated functional outcomes upon discharge from inpatient rehab are n/a  with PT, n/a with OT, n/a with SLP. 8. Estimated rehab length of stay to reach the above functional goals is: n/a 9. Does the patient have adequate social supports to accommodate these discharge functional goals? Potentially 10. Anticipated D/C setting: Other 11. Anticipated post D/C treatments: N/A 12. Overall Rehab/Functional Prognosis: fair  RECOMMENDATIONS: This patient's condition is appropriate for continued rehabilitative care in the following setting: SNF Patient has agreed to participate in recommended program. Potentially Note that insurance prior authorization may be required for reimbursement for recommended care.  Comment: This 80 yo female with poor hearing and limited vision is unlikely to progress in a manor compatible with our  inpatient rehab program. Rec SNF.  Ranelle Oyster, MD, [facility] Physical Medicine & Rehabilitation     06/14/2014

## 2014-06-14 NOTE — Evaluation (Signed)
Physical Therapy Evaluation Patient Details Name: [name] MRN: [id number] DOB: 01/09/1919 Today's Date: 06/14/2014   History of Present Illness  Patient is a pleasant 80 y/o female with hx of HTN, thyroid disease, hearing loss and vision deficits presents with anemia and UTI and was found to have transient episode of A.fib associated with chest pain. Complains of right hip pain radiating down to her foot with weightbearing. Pt with incomplete fractures of both femoral necks and left superior acetabulum. s/p surgical stabilization of incomplete right subtrochanteric femur fracture with IMN. Per ortho,WBAT BLEs.  Clinical Impression  Patient s/p surgical stabilization of right subtrochanteric fx with IMN POD 1. Pt very anxious about placing weight through RLE and about anticipating pain with movement. Pt very cooperative and motivated to return to PLOF. Pt requires constant repetition of cues due to being [facility]. Daughter would like CIR screen. If CIR denies pt, daughter and patient agreeable to SNF. Pt tolerated standing and taking a few steps today with minimal assist. Pt would benefit from skilled acute PT to improve strength, transfers and gait so pt can maximize independence and return to PLOF. Needs CIR vs ST SNF prior to returning home with supervision.     Follow Up Recommendations CIR;Supervision/Assistance - 24 hour    Equipment Recommendations  None recommended by PT    Recommendations for Other Services       Precautions / Restrictions Precautions Precautions: Fall Restrictions Weight Bearing Restrictions: Yes RLE Weight Bearing: Weight bearing as tolerated RLE Partial Weight Bearing Percentage or Pounds: BLEs.      Mobility  Bed Mobility Overal bed mobility: Needs Assistance Bed Mobility: Rolling;Sidelying to Sit Rolling: Supervision Sidelying to sit: [facility] elevated;Min assist       General bed mobility comments: Use of rail to assist with elevating trunk. Min assist to  steady trunk once EOB. VC for technique.  Transfers Overall transfer level: Needs assistance Equipment used: Rolling walker (2 wheeled) Transfers: Sit to/from [employer] Sit to Stand: Min guard Stand pivot transfers: Min assist       General transfer comment: Able to stand x1 from EOB, x1 from recliner, Min guard for safety and balance due to pt reluctant to place weight through RLE. Able to take a few pivotal steps to chair with assist for weightshifting to advance LLE.  Ambulation/Gait Ambulation/Gait assistance: Min assist Ambulation Distance (Feet): 6 Feet Assistive device: Rolling walker (2 wheeled) Gait Pattern/deviations: Step-to pattern;Decreased stance time - right;Decreased step length - left;Trunk flexed;Narrow base of support Gait velocity: Decreased   General Gait Details: Able to take a few steps forward and retro gait with Min A for weight shifting and assist negotiating RW. Very anxious about placing weight through RLE. Sa02 remained >92% on 02. 02 removed to blow nose at rest and Sa02 dropped to 88%.  Stairs            Wheelchair Mobility    Modified Rankin (Stroke Patients Only)       Balance Overall balance assessment: Needs assistance   Sitting balance-Leahy Scale: Fair Sitting balance - Comments: Able to sit EOB without support, total A to donn socks.     Standing balance-Leahy Scale: Poor Standing balance comment: Requires BUE support on RW for static and dynamic standing balance due to pain and balance deficits.                             Pertinent Vitals/Pain Pain  Assessment: 0-10 Pain Score:  (not rated on pain scale.) Pain Location: right hip Pain Descriptors / Indicators: Sore;Sharp;Aching;Tender;Grimacing Pain Intervention(s): Monitored during session;Premedicated before session;Repositioned    Home Living Family/patient expects to be discharged to:: Inpatient rehab Living Arrangements: Alone                     Prior Function Level of Independence: Independent with assistive device(s)   Gait / Transfers Assistance Needed: Reports Mod I with RW, no falls.  ADL's / Homemaking Assistance Needed: Pt reports her daughter assists with medication management, grocery shopping and driving.  Does sponge bathes at home.  Comments: Pt very HOH, does not wear hearing aides. Legally blind per MD note. Pt with macular degeneration of right eye and poor vision left eye. Glasses are at home.     Hand Dominance        Extremity/Trunk Assessment   Upper Extremity Assessment: Defer to OT evaluation           Lower Extremity Assessment: Generalized weakness;RLE deficits/detail RLE Deficits / Details: Guarded with AROM due to anticipation of pain. Able to perform full knee extension, limited hip flexion. Generalized weakness throughout.       Communication   Communication: HOH  Cognition Arousal/Alertness: Awake/alert Behavior During Therapy: Anxious Overall Cognitive Status: History of cognitive impairments - at baseline Area of Impairment: Following commands;Orientation Orientation Level: Disoriented to;Time (able to state she had surgery yesterday.)     Following Commands: Follows one step commands with increased time       General Comments: Requires constant repetition to perform tasks due to hearing impairment. Perseverating on wanting to eat breakfast throughout session.    General Comments General comments (skin integrity, edema, etc.): Very HOH and difficult to communicate at times due to this. Surgical site, clean dry and intact.     Exercises General Exercises - Lower Extremity Ankle Circles/Pumps: Both;10 reps;Seated Long Arc Quad: Both;10 reps;Seated Hip Flexion/Marching: Both;5 reps;Seated      Assessment/Plan    PT Assessment Patient needs continued PT services  PT Diagnosis Acute pain;Generalized weakness;Abnormality of gait   PT Problem List Decreased  strength;Pain;Cardiopulmonary status limiting activity;Decreased cognition;Decreased range of motion;Decreased activity tolerance;Decreased knowledge of use of DME;Decreased safety awareness;Decreased balance;Decreased mobility;Decreased knowledge of precautions;Decreased skin integrity  PT Treatment Interventions DME instruction;Balance training;Gait training;Cognitive remediation;Patient/family education;Functional mobility training;Therapeutic activities;Therapeutic exercise   PT Goals (Current goals can be found in the Care Plan section) Acute Rehab PT Goals Patient Stated Goal: to be able to walk and return home PT Goal Formulation: With patient/family Time For Goal Achievement: 06/28/14 Potential to Achieve Goals: Good    Frequency Min 5X/week   Barriers to discharge Decreased caregiver support Pt lives alone. Daughter very supportive and assists with all IADLs.    Co-evaluation               End of Session Equipment Utilized During Treatment: Gait belt;Oxygen Activity Tolerance: Patient tolerated treatment well Patient left: in chair;with call bell/phone within reach;with family/visitor present Nurse Communication: Mobility status;Weight bearing status;Precautions         Time: 1610-9604 PT Time Calculation (min): 34 min   Charges:   PT Evaluation $PT Re-evaluation: 1 Procedure PT Treatments $Gait Training: 8-22 mins   PT G CodesAlvie Heidelberg A 06/14/2014, 10:21 AM Alvie Heidelberg, PT, DPT [phone]

## 2014-06-15 ENCOUNTER — Encounter (HOSPITAL_COMMUNITY): Payer: Self-pay | Admitting: Orthopedic Surgery

## 2014-06-15 ENCOUNTER — Other Ambulatory Visit: Payer: Self-pay | Admitting: *Deleted

## 2014-06-15 DIAGNOSIS — S72143A Displaced intertrochanteric fracture of unspecified femur, initial encounter for closed fracture: Secondary | ICD-10-CM

## 2014-06-15 LAB — CBC
HCT: 28.8 % — ABNORMAL LOW (ref 36.0–46.0)
Hemoglobin: 9.2 g/dL — ABNORMAL LOW (ref 12.0–15.0)
MCH: 22.3 pg — AB (ref 26.0–34.0)
MCHC: 31.9 g/dL (ref 30.0–36.0)
MCV: 69.9 fL — ABNORMAL LOW (ref 78.0–100.0)
PLATELETS: 265 10*3/uL (ref 150–400)
RBC: 4.12 MIL/uL (ref 3.87–5.11)
RDW: 23.1 % — ABNORMAL HIGH (ref 11.5–15.5)
WBC: 12.6 10*3/uL — ABNORMAL HIGH (ref 4.0–10.5)

## 2014-06-15 LAB — BASIC METABOLIC PANEL
Anion gap: 12 (ref 5–15)
BUN: 16 mg/dL (ref 6–23)
CALCIUM: 9 mg/dL (ref 8.4–10.5)
CO2: 26 mEq/L (ref 19–32)
Chloride: 98 mEq/L (ref 96–112)
Creatinine, Ser: 0.64 mg/dL (ref 0.50–1.10)
GFR calc Af Amer: 86 mL/min — ABNORMAL LOW (ref >90)
GFR, EST NON AFRICAN AMERICAN: 75 mL/min — AB (ref >90)
Glucose, Bld: 130 mg/dL — ABNORMAL HIGH (ref 70–99)
POTASSIUM: 4 meq/L (ref 3.7–5.3)
Sodium: 136 mEq/L — ABNORMAL LOW (ref 137–147)

## 2014-06-15 MED ORDER — CARVEDILOL 3.125 MG PO TABS: 3.1250 mg | ORAL_TABLET | Freq: Two times a day (BID) | ORAL | Status: DC

## 2014-06-15 MED ORDER — POLYETHYLENE GLYCOL 3350 17 G PO PACK: 17.0000 g | PACK | Freq: Every day | ORAL | Status: AC

## 2014-06-15 MED ORDER — TRAMADOL HCL 50 MG PO TABS: 50.0000 mg | ORAL_TABLET | Freq: Two times a day (BID) | ORAL | Status: DC

## 2014-06-15 NOTE — Telephone Encounter (Signed)
[facility] 

## 2014-06-15 NOTE — Evaluation (Signed)
Occupational Therapy Evaluation Patient Details Name: [name] MRN: [id number] DOB: June 11, 1919 Today's Date: 06/15/2014    History of Present Illness Patient is a pleasant 80 y/o female with hx of HTN, thyroid disease, hearing loss and vision deficits presents with anemia and UTI and was found to have transient episode of A.fib associated with chest pain. Complains of right hip pain radiating down to her foot with weightbearing. Pt with incomplete fractures of both femoral necks and left superior acetabulum. s/p surgical stabilization of incomplete right subtrochanteric femur fracture with IMN. Per ortho,WBAT BLEs.   Clinical Impression   Pt was admitted with the above fxs and is s/p IMN to R femur.  She was mod I with BADLs prior to admission.  Pt anxious about moving during OT eval but only needed min A.  She needs up to total A for LB adls/hygiene.  She will benefit from skilled OT to increase safety and independence with ADLs.  Goals are set for min guard to min A level in acute.     Follow Up Recommendations  SNF    Equipment Recommendations  3 in 1 bedside comode    Recommendations for Other Services       Precautions / Restrictions Precautions Precautions: Fall Restrictions Weight Bearing Restrictions: Yes RLE Weight Bearing: Weight bearing as tolerated      Mobility Bed Mobility   Bed Mobility: Supine to Sit     Supine to sit: Mod assist     General bed mobility comments: assist for trunk and legs  Transfers   Equipment used: Rolling walker (2 wheeled) Transfers: Sit to/from Stand Sit to Stand: Min assist Stand pivot transfers: Min assist       General transfer comment: assist to rise and steady    Balance                                            ADL Overall ADL's : Needs assistance/impaired     Grooming: Oral care;Minimal assistance;Sitting   Upper Body Bathing: Minimal assitance;Sitting   Lower Body Bathing: Maximal  assistance;Sit to/from stand   Upper Body Dressing : Minimal assistance;Sitting   Lower Body Dressing: Maximal assistance;Sit to/from stand   Toilet Transfer: Stand-pivot;Minimal assistance   Toileting- Architect and Hygiene: Total assistance;Sit to/from stand         General ADL Comments: pt needs min A for adls to guard lines.  Daughter came midway through evaluation. Pt very fearful of falling when transferring to commode.  Unable to release a hand in standing for hygiene.  pt did spill water when performing oral hygiene from seated position:  She has decreased vision and is very HOH.       Vision                     Perception     Praxis      Pertinent Vitals/Pain Pain Assessment: Faces Faces Pain Scale: Hurts little more Pain Location: R hip Pain Intervention(s): Repositioned;Ice applied;Limited activity within patient's tolerance;Monitored during session     Hand Dominance Right   Extremity/Trunk Assessment Upper Extremity Assessment Upper Extremity Assessment: Generalized weakness           Communication Communication Communication: HOH   Cognition Arousal/Alertness: Awake/alert Behavior During Therapy: Anxious Overall Cognitive Status: History of cognitive impairments - at baseline  General Comments: pt very HOH; impulsive but may be due to not hearing cues   General Comments       Exercises       Shoulder Instructions      Home Living Family/patient expects to be discharged to:: Skilled nursing facility                                        Prior Functioning/Environment Level of Independence: Independent with assistive device(s)        Comments: daughter assisted with iadls    OT Diagnosis: Generalized weakness;Acute pain   OT Problem List: Decreased strength;Decreased activity tolerance;Impaired balance (sitting and/or standing);Decreased knowledge of use of DME or AE;Pain    OT Treatment/Interventions: Self-care/ADL training;DME and/or AE instruction;Balance training;Patient/family education    OT Goals(Current goals can be found in the care plan section) Acute Rehab OT Goals Patient Stated Goal: to be able to walk and return home OT Goal Formulation: With patient/family Time For Goal Achievement: 06/22/14 Potential to Achieve Goals: Good ADL Goals Pt Will Perform Grooming: with min guard assist;standing Pt Will Transfer to Toilet: with min guard assist;ambulating;bedside commode Pt Will Perform Toileting - Clothing Manipulation and hygiene: with min guard assist;sit to/from stand Additional ADL Goal #1: pt will complete UB adls with set up sitting Additional ADL Goal #2: pt will complete LB adls with min A and AE sit to stand  OT Frequency: Min 2X/week   Barriers to D/C:            Co-evaluation              End of Session    Activity Tolerance: Patient tolerated treatment well Patient left: in chair;with call bell/phone within reach;with chair alarm set;with family/visitor present   Time: 1324-4010 OT Time Calculation (min): 35 min Charges:  OT General Charges $OT Visit: 1 Procedure OT Evaluation $Initial OT Evaluation Tier I: 1 Procedure OT Treatments $Self Care/Home Management : 23-37 mins G-Codes:    [name] Jun 16, 2014, 11:17 AM [name], OTR/L [phone] 06/16/14

## 2014-06-15 NOTE — Progress Notes (Signed)
Report called to [facility] [phone]

## 2014-06-15 NOTE — Clinical Social Work Placement (Signed)
Clinical Social Work Department CLINICAL SOCIAL WORK PLACEMENT NOTE 06/15/2014  Patient:  [name], [name]  Account Number:  [id number] Admit date:  06/10/2014  Clinical Social Worker:  Lavell Luster  Date/time:  06/12/2014 04:25 PM  Clinical Social Work is seeking post-discharge placement for this patient at the following level of care:   SKILLED NURSING   (*CSW will update this form in Epic as items are completed)   06/12/2014  Patient/family provided with [facility] Department of Clinical Social Work's list of facilities offering this level of care within the geographic area requested by the patient (or if unable, by the patient's family).  06/12/2014  Patient/family informed of their freedom to choose among providers that offer the needed level of care, that participate in Medicare, Medicaid or managed care program needed by the patient, have an available bed and are willing to accept the patient.  06/12/2014  Patient/family informed of [facility]' ownership interest in [facility], as well as of the fact that they are under no obligation to receive care at this facility.  PASARR submitted to EDS on 06/12/2014 PASARR number received on 06/12/2014  FL2 transmitted to all facilities in geographic area requested by pt/family on  06/12/2014 FL2 transmitted to all facilities within larger geographic area on   Patient informed that his/her managed care company has contracts with or will negotiate with  certain facilities, including the following:     Patient/family informed of bed offers received:  06/13/2014 Patient chooses bed at [facility] Physician recommends and patient chooses bed at    Patient to be transferred to [facility] on  06/15/2014 Patient to be transferred to facility by Ambulance Patient and family notified of transfer on 06/15/2014 Name of family member notified:  Celine Mans  The following physician request were entered in  Epic:   Additional Comments:   Per MD patient ready for DC to [facility]. RN, patient, patient's family, and facility notified of DC. RN given number for report. DC packet on chart. [facility] transport requested for patient. CSW signing off.    Roddie Mc MSW, [place], [place], [phone]

## 2014-06-15 NOTE — Progress Notes (Signed)
Rehab admissions - I met with pt in follow up to rehab MD consult but pt was so very hard of hearing. Because of her hearing impairment, I was unable to have a clear discussion with her to tell her we are recommending SNF be pursued. Pt was very pleasant and began to talk about her macular degeneration.  I then updated Neoma Laming, case management who said the plan is for SNF today and that social worker/family are aware.  I will now sign off pt's case.  Thanks.  Nanetta Batty, PT Rehabilitation Admissions Coordinator [phone]

## 2014-06-15 NOTE — Progress Notes (Signed)
Physical Therapy Treatment Patient Details Name: [name] MRN: [id number] DOB: [birth year] Today's Date: 06/15/2014    History of Present Illness Patient is a pleasant 79 y/o female with hx of HTN, thyroid disease, hearing loss and vision deficits presents with anemia and UTI and was found to have transient episode of A.fib associated with chest pain. Complains of right hip pain radiating down to her foot with weightbearing. Pt with incomplete fractures of both femoral necks and left superior acetabulum. s/p surgical stabilization of incomplete right subtrochanteric femur fracture with IMN. Per ortho,WBAT BLEs.    PT Comments    Patient progressing well with mobility. Improved ambulation distance today however fatigues easily requiring more assist during gait for stability. Required manual assist to advance LLE during gait training and constant cues for safety with RW. No pain reported during session. Very cooperative. Discharge plan updated to ST SNF as CIR denied patient. Will continue to follow and progress as tolerated.   Follow Up Recommendations  SNF;Supervision/Assistance - 24 hour     Equipment Recommendations  None recommended by PT    Recommendations for Other Services       Precautions / Restrictions Precautions Precautions: Fall Restrictions Weight Bearing Restrictions: Yes RLE Weight Bearing: Weight bearing as tolerated RLE Partial Weight Bearing Percentage or Pounds: BLEs.    Mobility  Bed Mobility   Bed Mobility: Sit to Supine     Supine to sit: Mod assist     General bed mobility comments: Received sitting in chair upon arrival. Required assist mobilizing BLEs into bed.   Transfers Overall transfer level: Needs assistance Equipment used: Rolling walker (2 wheeled) Transfers: Sit to/from Stand Sit to Stand: Min assist Stand pivot transfers: Min assist       General transfer comment: Stood x2 from chair with VC for hand placement. Increased time. Min  A to stabilize and for balance.  Ambulation/Gait Ambulation/Gait assistance: Min assist;Mod assist Ambulation Distance (Feet): 20 Feet (+20' with 1 seated rest break.) Assistive device: Rolling walker (2 wheeled) Gait Pattern/deviations: Step-to pattern;Step-through pattern;Decreased stance time - right;Decreased step length - left;Decreased stride length;Shuffle;Trunk flexed;Narrow base of support Gait velocity: Decreased   General Gait Details: Increased knee flexion RLE throughout gait cycle. Manual assist to advance LLE as pt with difficulty. Shuffling of left foot noted. Fatigue throughout BUEs secondary to pushing through handles to stabilize. Min A progressing to Mod A when fatigued with posterior lean  and need for seated rest break.    Stairs            Wheelchair Mobility    Modified Rankin (Stroke Patients Only)       Balance Overall balance assessment: Needs assistance   Sitting balance-Leahy Scale: Fair       Standing balance-Leahy Scale: Poor Standing balance comment: Requires BUE support on RW for static and dynamice standing balance due to pain and balance deficits.                    Cognition Arousal/Alertness: Awake/alert Behavior During Therapy: Anxious Overall Cognitive Status: History of cognitive impairments - at baseline                 General Comments: pt very HOH; impulsive but may be due to not hearing cues    Exercises General Exercises - Lower Extremity Ankle Circles/Pumps: Both;10 reps;Seated    General Comments        Pertinent Vitals/Pain Pain Assessment: No/denies pain Faces Pain Scale: Hurts little more  Pain Location: R hip Pain Intervention(s): Repositioned;Ice applied;Limited activity within patient's tolerance;Monitored during session    Home Living Family/patient expects to be discharged to:: Skilled nursing facility                    Prior Function Level of Independence: Independent with  assistive device(s)      Comments: daughter assisted with iadls   PT Goals (current goals can now be found in the care plan section) Acute Rehab PT Goals Patient Stated Goal: to be able to walk and return home Progress towards PT goals: Progressing toward goals    Frequency       PT Plan Discharge plan needs to be updated    Co-evaluation             End of Session Equipment Utilized During Treatment: Gait belt Activity Tolerance: Patient tolerated treatment well;Patient limited by fatigue Patient left: in bed;with call bell/phone within reach;with bed alarm set     Time: 1120-1147 PT Time Calculation (min): 27 min  Charges:  $Gait Training: 23-37 mins                    G CodesAlvie Heidelberg A June 17, 2014, 12:08 PM Alvie Heidelberg, PT, DPT [phone]

## 2014-06-15 NOTE — Discharge Summary (Addendum)
Physician Discharge Summary  [name] ZOX:[id number] DOB: 03-02-1919 DOA: 06/10/2014  PCP: Charlette Caffey, MD  Admit date: 06/10/2014 Discharge date: 06/15/2014  Time spent:  Recommendations for Outpatient Follow-up:  1. Follow up with orthopedics, 2. Will go to SNF for rehab. 3. Follow  With PCP in 4 weeks.  Discharge Diagnoses:  Principal Problem:   Intertrochanteric fracture of right hip Active Problems:   Hypertension   Anemia   Urinary tract infectious disease   Hypokalemia   Symptomatic anemia   Chest pain   Paroxysmal a-fib   Discharge Condition: stable   Filed Weights   06/10/14 1120 06/10/14 2022 06/11/14 1700  Weight: 53.071 kg (117 lb) 51 kg (112 lb 7 oz) 53.071 kg (117 lb)    History of present illness:  80 y.o. female has a past medical history of Thyroid disease; Hypertension; Cannot hear; and Vision loss. Presented with  Patient have had right leg pain for a while from the thigh to the hip. She have had cortisone injections. She was supposed to have an MRI done but was in too much pain and was brought to [facility]. MRI done there showed spondylosis but no change from prior. Her hg was noted to be down to 6.4. Patient has no hx of significant anemia. Although she was told before she may have some low blood counts. Patient is legally blind but family states no hx of melena or blood in stools. Never had a colonoscopy.  Hemocult negative in ER. Family denies any SOB or chest pain. On arrival to [facility] she was reported noted in A.fib and was having some chest pain. She has spontaneously converted.    Hospital Course:  Intertrochanteric fracture of right hip: - X-ray as below, orthopedic surgeon was consulted - S/p ORIF 9.1.2015.  - PT evaluated her recommended skilled nursing facility.  Anemia of iron deficiency:  - Stool negative for occult blood, likely intermittent GI bleed.  - Discussed with patient's daughter who agrees with no further workup.   - S/p 2 units of packed RBC transfusion and IV iron. - Discussed with the orthopedic surgeon and came to the agreement along with the daughter that she would not be a candidate for any anticoagulation due to the unknown cause of her iron deficiency anemia.  Paroxismal atrial fibrillation in the ER.  - Resolved, placed on low-dose beta blocker monitor on telemetry   Hypertension:  - placed on Coreg and monitor.   UTI:  - Started on Rocephin - Finish Rocephin 06-14-2014.  - No UC done.    Procedures: Echocardiogram on 06/12/2014:estimated ejection fraction was in the range of 60% to 65%. Wall motion was normal; there were no regional wall motion abnormalities. Doppler parameters are consistent with abnormal left ventricular relaxation (grade 1 diastolic dysfunction CT abdomen pelvis 06/10/2014 Chest x-ray 06/10/2014 CT hip without contrast 08/ MRI of lumbar spine 06/10/2014 MRI of the hip it 8.30.2015    Consultations:  Orthopedic surgery  Discharge Exam: Filed Vitals:   06/15/14 0822  BP: 169/84  Pulse: 76  Temp:   Resp:     General: Awake alert and oriented x3 Cardiovascular: Regular rate and rhythm Respiratory: Good air movement and clear to auscultation  Discharge Instructions You were cared for by a hospitalist during your hospital stay. If you have any questions about your discharge medications or the care you received while you were in the hospital after you are discharged, you can call the unit and asked to speak  with the hospitalist on call if the hospitalist that took care of you is not available. Once you are discharged, your primary care physician will handle any further medical issues. Please note that NO REFILLS for any discharge medications will be authorized once you are discharged, as it is imperative that you return to your primary care physician (or establish a relationship with a primary care physician if you do not have one) for your aftercare needs so  that they can reassess your need for medications and monitor your lab values.  Discharge Instructions   Diet - low sodium heart healthy    Complete by:  As directed      Increase activity slowly    Complete by:  As directed           Current Discharge Medication List    START taking these medications   Details  carvedilol (COREG) 3.125 MG tablet Take 1 tablet (3.125 mg total) by mouth 2 (two) times daily with a meal. Qty: 30 tablet, Refills: 3    oxyCODONE-acetaminophen (PERCOCET/ROXICET) 5-325 MG per tablet Take 1 tablet by mouth every 4 (four) hours as needed for severe pain. Qty: 30 tablet, Refills: 0    polyethylene glycol (MIRALAX / GLYCOLAX) packet Take 17 g by mouth daily. Qty: 14 each, Refills: 0      CONTINUE these medications which have NOT CHANGED   Details  acetaminophen-codeine (TYLENOL #3) 300-30 MG per tablet Take 1 tablet by mouth every 6 (six) hours as needed for moderate pain.    cholecalciferol (VITAMIN D) 1000 UNITS tablet Take 3,000 Units by mouth daily.    levothyroxine (SYNTHROID, LEVOTHROID) 50 MCG tablet Take 50 mcg by mouth every morning.     losartan (COZAAR) 50 MG tablet Take 50 mg by mouth every morning.     triamterene-hydrochlorothiazide (DYAZIDE) 37.5-25 MG per capsule Take 1 capsule by mouth every morning.     traMADol (ULTRAM) 50 MG tablet Take 50 mg by mouth every 12 (twelve) hours.      STOP taking these medications     HYDROCODONE-GUAIFENESIN PO        Allergies  Allergen Reactions  . Aspirin Other (See Comments)    Nose bleeds.    Follow-up Information   Schedule an appointment as soon as possible for a visit with Janee Morn, DAVID A., MD. (appoximately 2 weeks postop)    Specialty:  Orthopedic Surgery   Contact information:   [address] [place] [address] [phone]        The results of significant diagnostics from this hospitalization (including imaging, microbiology, ancillary and laboratory) are listed  below for reference.    Significant Diagnostic Studies: Ct Abdomen Pelvis Wo Contrast  06/10/2014   CLINICAL DATA:  Right flank pain.  Nausea.  EXAM: CT ABDOMEN AND PELVIS WITHOUT CONTRAST  TECHNIQUE: Multidetector CT imaging of the abdomen and pelvis was performed following the standard protocol without IV contrast.  COMPARISON:  None.  FINDINGS: Mild cardiomegaly. Minimal dependent atelectasis or scarring in the lung bases. No effusions.  Mild fullness of the renal pelves bilaterally which could reflect extrarenal pelves or Mild chronic UPJ obstructions. Findings are similar to prior study. Large right lower pole renal cyst measures up to 7.5 cm. Multiple bilateral renal cysts present. No ureteral stones. Urinary bladder is unremarkable.  Liver, gallbladder, spleen, pancreas and adrenals have an unremarkable unenhanced appearance. Aorta and branch vessels are heavily calcified, non aneurysmal.  Sigmoid diverticulosis. No active diverticulitis. Moderate stool throughout  the colon. Small bowel and stomach are decompressed, grossly unremarkable.  Numerous compression fractures in the thoracolumbar spine. Multi level Prior vertebral augmentation. Findings similar to prior study.  IMPRESSION: Mild fullness of the renal pelves bilaterally with decompressed ureters. This is stable since 2013. This could reflect extrarenal pelves or mild chronic UPJ obstruction. No ureteral stones.  Bilateral renal cysts.  Sigmoid diverticulosis.  No active diverticulitis.  No acute findings in the abdomen or pelvis.   Electronically Signed   By: Charlett Nose M.D.   On: 06/10/2014 14:51   Dg Hip Operative Right  06/13/2014   CLINICAL DATA:  ORIF right hip  EXAM: DG OPERATIVE RIGHT HIP  TECHNIQUE: A series of 4 intraoperative fluoroscopic spot images of the right hip is submitted.  COMPARISON:  MR 06/11/2014  FINDINGS: IM rod and sliding screw placement across the right femoral neck. Patchy femoral-popliteal arterial calcifications.   IMPRESSION: 1. Internal fixation across right femoral neck.   Electronically Signed   By: Oley Balm M.D.   On: 06/13/2014 18:10   Mr Lumbar Spine Wo Contrast  06/10/2014   CLINICAL DATA:  80 year old with low back pain radiating into the left leg. History of vertebroplasty. Urinary incontinence.  EXAM: MRI LUMBAR SPINE WITHOUT CONTRAST  TECHNIQUE: Multiplanar, multisequence MR imaging of the lumbar spine was performed. No intravenous contrast was administered.  COMPARISON:  MRI 01/16/2012.  Abdominal pelvic CT 06/10/2014.  FINDINGS: The lumbar alignment is stable and near anatomic. Multiple chronic thoracolumbar compression deformities are again noted. The patient has undergone previous spinal augmentation at T11, T12 and L1. Fractures at those levels have healed. There are chronic superior endplate compression deformities at L2 and L3 and inferior endplate compression deformities at L4 and L5. There is no evidence of acute fracture.  The conus medullaris extends to the L1-2 level and appears normal. No paraspinal abnormalities are identified.Multiple renal cysts are present bilaterally.  L1-2: Mild disc bulging. No significant spinal stenosis or nerve root encroachment.  L2-3: Annular disc bulging eccentric to the left with mild facet and ligamentous hypertrophy. There is mild narrowing of the lateral recesses. The foramina are patent.  L3-4: Mild disc bulging, facet and ligamentous hypertrophy. No significant spinal stenosis or nerve root encroachment.  L4-5: Disc bulging and osteophytes are asymmetric to the right. There is mild facet and ligamentous hypertrophy. There is mild narrowing of the lateral recesses. The foramina are patent.  L5-S1: There are moderate facet degenerative changes with mild disc bulging eccentric to the right. There is mild inferior foraminal narrowing on the right without definite nerve root encroachment.  IMPRESSION: 1. Multiple healed thoracolumbar compression deformities  status post spinal augmentation. No acute osseous findings. 2. Generally stable multilevel spondylosis. No high-grade spinal stenosis or nerve root encroachment demonstrated.   Electronically Signed   By: Roxy Horseman M.D.   On: 06/10/2014 17:21   Ct Hip Right Wo Contrast  06/11/2014   CLINICAL DATA:  Hip pain.  EXAM: CT OF THE RIGHT HIP WITHOUT CONTRAST  TECHNIQUE: Multidetector CT imaging was performed according to the standard protocol. Multiplanar CT image reconstructions were also generated.  COMPARISON:  CT 06/10/2014  FINDINGS: Diffuse osteopenia and degenerative change present. An extremely subtle lucency is noted along the medial aspect of the base of the right femoral neck . Punctate area air is noted within the medullary bone adjacent to this region. These changes may be related to an extremely subtle fracture of the base of the right femoral neck. No  displaced fractures noted. Sacrum and SI joints are intact were visualized. Sigmoid colonic diverticulosis. Right inguinal hernia with herniation of fat only.Peripheral vascular disease.  IMPRESSION: 1. Extremely subtle lucency is noted along the medial aspect of the base of the right femoral neck. Adjacent punctate area of air is noted in the medullary bone. These changes may be related to an extremely subtle fracture of the base of the right femoral neck. If further evaluation is needed MRI can be obtained. 2. Diffuse severe osteopenia and degenerative change.   Electronically Signed   By: Maisie Fus  Register   On: 06/11/2014 11:19   Mr Hip Right Wo Contrast  06/11/2014   CLINICAL DATA:  Persistent right hip pain. No reported acute injury or history of malignancy.  EXAM: MR OF THE RIGHT HIP WITHOUT CONTRAST  TECHNIQUE: Multiplanar, multisequence MR imaging was performed. No intravenous contrast was administered.  COMPARISON:  Right hip CT 06/11/2014, lumbar MRI 06/10/2014, pelvic CT 06/10/2014 and whole body bone scan 12/23/2011.  FINDINGS: Bones: There  is a fracture of the proximal right femur extending laterally from the lesser trochanter, corresponding with the recent CT finding. This fracture extends into the intertrochanteric region, but appears incomplete. There is prominent surrounding bone marrow and soft tissue edema. There is a contralateral fracture of the left femoral neck medially with marrow edema, also appearing incomplete. In addition, there is a fracture of the left acetabular roof with a linear component best seen on the coronal images. The sacroiliac joints and symphysis pubis appear normal.  Articular cartilage and labrum  Articular cartilage: There are mild degenerative changes at both hips. There is no evidence of femoral head avascular necrosis.  Labrum: There is acetabular labral degeneration without paralabral cyst formation.  Joint or bursal effusion  Joint effusion: Small bilateral hip joint effusions.  Bursae: There is no focal periarticular fluid collection. However, there is moderate edema surrounding the proximal right femur fracture.  Muscles and tendons  Muscles and tendons: The gluteus, hamstring and iliopsoas tendons appear normal. The piriformis muscles appear symmetric.  Other findings  Miscellaneous: Bilateral renal cysts are partially imaged. There are chronic compression deformities within the lower lumbar spine. There is prominent fat within both inguinal canals which appears stable.  IMPRESSION: 1. There are multiple fractures involving the medial intertrochanteric region of the right femur, the left femoral neck medially and the left superior acetabulum. Given the presence of numerous thoracolumbar compression deformities on prior imaging, these are likely insufficiency fractures. No definite pathologic features or other suspicious marrow lesions identified. 2. The right femur fracture appears incomplete, although there is surrounding soft tissue edema. With continued weight-bearing, this is at risk to become a complete  fracture.   Electronically Signed   By: Roxy Horseman M.D.   On: 06/11/2014 14:35   Dg Chest Port 1 View  06/10/2014   CLINICAL DATA:  Cough and shortness of breath. Right lower back pain.  EXAM: PORTABLE CHEST - 1 VIEW  COMPARISON:  01/17/2010  FINDINGS: Shallow inspiration. Borderline heart size with normal pulmonary vascularity. Peribronchial thickening and central interstitial changes suggesting chronic bronchitis. No focal airspace disease or consolidation in the lungs. No blunting of costophrenic angles. No pneumothorax. Calcified and tortuous aorta. Degenerative changes in the spine and shoulders. Multiple previous kyphoplasties in the thoracic spine.  IMPRESSION: Chronic bronchitic changes. No evidence of active pulmonary disease.   Electronically Signed   By: Burman Nieves M.D.   On: 06/10/2014 22:13    Microbiology: Recent Results (  from the past 240 hour(s))  MRSA PCR SCREENING     Status: None   Collection Time    06/10/14  6:54 PM      Result Value Ref Range Status   MRSA by PCR NEGATIVE  NEGATIVE Final   Comment:            The GeneXpert MRSA Assay (FDA     approved for NASAL specimens     only), is one component of a     comprehensive MRSA colonization     surveillance program. It is not     intended to diagnose MRSA     infection nor to guide or     monitor treatment for     MRSA infections.  SURGICAL PCR SCREEN     Status: None   Collection Time    06/13/14 12:02 AM      Result Value Ref Range Status   MRSA, PCR NEGATIVE  NEGATIVE Final   Staphylococcus aureus NEGATIVE  NEGATIVE Final   Comment:            The Xpert SA Assay (FDA     approved for NASAL specimens     in patients over 45 years of age),     is one component of     a comprehensive surveillance     program.  Test performance has     been validated by [employer] for patients greater     than or equal to 49 year old.     It is not intended     to diagnose infection nor to     guide or monitor  treatment.     Labs: Basic Metabolic Panel:  Recent Labs Lab 06/10/14 1300 06/11/14 0730 06/13/14 0501 06/14/14 0545 06/15/14 0545  NA 139 139 139 139 136*  K 3.5* 4.1 3.8 3.9 4.0  CL 100 102 102 102 98  CO2 GLUCOSE 132* 87 98 105* 130*  BUN CREATININE 0.90 0.74 0.68 0.67 0.64  CALCIUM 10.1 8.9 9.7 9.0 9.0  MG  --  2.3  --   --   --   PHOS  --  3.0  --   --   --    Liver Function Tests:  Recent Labs Lab 06/10/14 2059 06/11/14 0730  AST 10 11  ALT 12 12  ALKPHOS 93 94  BILITOT 0.3 1.0  PROT 5.7* 5.8*  ALBUMIN 3.1* 3.1*   No results found for this basename: LIPASE, AMYLASE,  in the last 168 hours No results found for this basename: AMMONIA,  in the last 168 hours CBC:  Recent Labs Lab 06/10/14 1300 06/11/14 1110 06/12/14 0555 06/13/14 0501 06/14/14 0545 06/15/14 0545  WBC 9.2  --  10.5 10.1 12.6* 12.6*  NEUTROABS 7.5  --  8.2* 8.0*  --   --   HGB 6.4* 10.1* 9.8* 9.8* 9.2* 9.2*  HCT 21.0* 31.3* 29.6* 30.6* 29.1* 28.8*  MCV 65.2*  --  68.2* 70.7* 70.1* 69.9*  PLT 405*  --  PLATELET CLUMPS NOTED ON SMEAR, COUNT APPEARS ADEQUATE 345 320 265   Cardiac Enzymes:  Recent Labs Lab 06/10/14 1300 06/10/14 2052 06/11/14 0730 06/11/14 0733  TROPONINI <0.30 <0.30 <0.30 <0.30   BNP: BNP (last 3 results) No results found for this basename: PROBNP,  in the last 8760 hours CBG:  Recent Labs Lab 06/10/14 2019  GLUCAP 164*  Signed:  Stephani Police  [facility] 06/15/2014, 12:30 PM

## 2014-06-20 ENCOUNTER — Non-Acute Institutional Stay (SKILLED_NURSING_FACILITY): Payer: Medicare Other | Admitting: Internal Medicine

## 2014-06-20 ENCOUNTER — Other Ambulatory Visit: Payer: Self-pay | Admitting: *Deleted

## 2014-06-20 DIAGNOSIS — E039 Hypothyroidism, unspecified: Secondary | ICD-10-CM

## 2014-06-20 DIAGNOSIS — S72009S Fracture of unspecified part of neck of unspecified femur, sequela: Secondary | ICD-10-CM

## 2014-06-20 DIAGNOSIS — D62 Acute posthemorrhagic anemia: Secondary | ICD-10-CM

## 2014-06-20 DIAGNOSIS — I4891 Unspecified atrial fibrillation: Secondary | ICD-10-CM

## 2014-06-20 DIAGNOSIS — I480 Paroxysmal atrial fibrillation: Secondary | ICD-10-CM

## 2014-06-20 DIAGNOSIS — S72141S Displaced intertrochanteric fracture of right femur, sequela: Secondary | ICD-10-CM

## 2014-06-20 MED ORDER — LORAZEPAM 0.5 MG PO TABS: ORAL_TABLET | ORAL | Status: DC

## 2014-06-20 NOTE — Telephone Encounter (Signed)
[facility] 

## 2014-06-22 NOTE — Progress Notes (Signed)
HISTORY & PHYSICAL  DATE: 06/20/2014   FACILITY: [facility]  LEVEL OF CARE: SNF (31)  ALLERGIES:  Allergies  Allergen Reactions  . Aspirin Other (See Comments)    Nose bleeds.     CHIEF COMPLAINT:  Manage right hip fracture, acute blood loss anemia and atrial fibrillation  HISTORY OF PRESENT ILLNESS: Patient is a 80 year old Caucasian female.  HIP FRACTURE: The patient had a mechanical fall and sustained a femur fracture.  Patient subsequently underwent surgical repair and tolerated the procedure well. Patient is admitted to this facility for short-term rehabilitation. Patient denies hip pain currently. No complications reported from the pain medications currently being used.  ATRIAL FIBRILLATION: the patients atrial fibrillation remains stable.  The patient denies DOE, tachycardia, orthopnea, transient neurological sx, pedal edema, palpitations, & PNDs.  No complications noted from the medications currently being used.  ANEMIA: The anemia has been stable. The patient denies fatigue, melena or hematochezia. No complications from the medications currently being used. Postoperatively the patient suffered acute blood loss. She received 2 units of packed red blood cells and IV iron.  PAST MEDICAL HISTORY :  Past Medical History  Diagnosis Date  . Thyroid disease   . Hypertension   . Cannot hear   . Vision loss     PAST SURGICAL HISTORY: Past Surgical History  Procedure Laterality Date  . Cesarean section    . Abdominal hysterectomy    . Kyphoplasty    . Intramedullary (im) nail intertrochanteric Right 06/13/2014    Procedure: Affix Nail/Right;  Surgeon: Jodi Marble, MD;  Location: [facility] OR;  Service: Orthopedics;  Laterality: Right;    SOCIAL HISTORY:  reports that she has never smoked. She does not have any smokeless tobacco history on file. She reports that she does not drink alcohol or use illicit drugs.  FAMILY HISTORY:  Family History    Problem Relation Age of Onset  . Breast cancer Sister   . Cancer Brother   . Liver disease Son   . Lung cancer Son     CURRENT MEDICATIONS: Reviewed per MAR/see medication list  REVIEW OF SYSTEMS: Difficult to obtain due to severe hearing loss  PHYSICAL EXAMINATION  VS:  See VS section  GENERAL: no acute distress, normal body habitus EYES: conjunctivae normal, sclerae normal, normal eye lids MOUTH/THROAT: lips without lesions,no lesions in the mouth,tongue is without lesions,uvula elevates in midline NECK: supple, trachea midline, no neck masses, no thyroid tenderness, no thyromegaly LYMPHATICS: no LAN in the neck, no supraclavicular LAN RESPIRATORY: breathing is even & unlabored, BS CTAB CARDIAC: Heart rate is irregularly irregular the, no murmur,no extra heart sounds, no edema GI:  ABDOMEN: abdomen soft, normal BS, no masses, no tenderness  LIVER/SPLEEN: no hepatomegaly, no splenomegaly MUSCULOSKELETAL: HEAD: normal to inspection  EXTREMITIES: LEFT UPPER EXTREMITY: full range of motion, normal strength & tone RIGHT UPPER EXTREMITY:  full range of motion, normal strength & tone LEFT LOWER EXTREMITY:  Minimal range of motion, decreased strength & tone RIGHT LOWER EXTREMITY: Not tested due to surgery PSYCHIATRIC: the patient is alert & oriented to person, affect & behavior appropriate  LABS/RADIOLOGY:  Labs reviewed: Basic Metabolic Panel:  Recent Labs  98/11/91 1300 06/11/14 0730 06/13/14 0501 06/14/14 0545 06/15/14 0545  NA 139 139 139 139 136*  K 3.5* 4.1 3.8 3.9 4.0  CL 100 102 102 102 98  CO2 GLUCOSE 132* 87 98 105* 130*  BUN CREATININE 0.90 0.74 0.68 0.67 0.64  CALCIUM 10.1 8.9 9.7 9.0 9.0  MG  --  2.3  --   --   --   PHOS  --  3.0  --   --   --    Liver Function Tests:  Recent Labs  06/10/14 2059 06/11/14 0730  AST 10 11  ALT 12 12  ALKPHOS 93 94  BILITOT 0.3 1.0  PROT 5.7* 5.8*  ALBUMIN 3.1* 3.1*    CBC:  Recent Labs  06/10/14 1300  06/12/14 0555 06/13/14 0501 06/14/14 0545 06/15/14 0545  WBC 9.2  --  10.5 10.1 12.6* 12.6*  NEUTROABS 7.5  --  8.2* 8.0*  --   --   HGB 6.4*  < > 9.8* 9.8* 9.2* 9.2*  HCT 21.0*  < > 29.6* 30.6* 29.1* 28.8*  MCV 65.2*  --  68.2* 70.7* 70.1* 69.9*  PLT 405*  --  PLATELET CLUMPS NOTED ON SMEAR, COUNT APPEARS ADEQUATE 345 320 265  < > = values in this interval not displayed.  Cardiac Enzymes:  Recent Labs  06/10/14 2052 06/11/14 0730 06/11/14 0733  TROPONINI <0.30 <0.30 <0.30   CBG:  Recent Labs  06/10/14 2019  GLUCAP 164*    Transthoracic Echocardiography  Patient:    Jesselle, Laflamme MR #:       [id number] Study Date: 06/12/2014 Gender:     F Age:        72 Height:     165.1 cm Weight:     53.1 kg BSA:        1.55 m^2 Pt. Status: Room:       [id number]   ATTENDING    Jinger Neighbors, Prashant K  REFERRING    Leroy Sea  ADMITTING    Edsel Petrin [id number]  PERFORMING   [facility], Inpatient  SONOGRAPHER  Sheralyn Boatman  cc:  ------------------------------------------------------------------- LV EF: 60% -   65%  ------------------------------------------------------------------- Indications:      Chest pain 786.51.  ------------------------------------------------------------------- History:   PMH:  Paroxysmal AFIB.  Risk factors:  Hypertension.  ------------------------------------------------------------------- Study Conclusions  - Left ventricle: The cavity size was normal. There was mild   concentric hypertrophy. Systolic function was normal. The   estimated ejection fraction was in the range of 60% to 65%. Wall   motion was normal; there were no regional wall motion   abnormalities. Doppler parameters are consistent with abnormal   left ventricular relaxation (grade 1 diastolic dysfunction).   Doppler parameters are consistent with elevated ventricular   end-diastolic filling pressure. -  Aortic valve: Trileaflet; mildly thickened, mildly calcified   leaflets. Transvalvular velocity was within the normal range.   There was no stenosis. There was mild regurgitation. - Aortic root: The aortic root was normal in size. - Mitral valve: Calcified annulus. There was mild regurgitation. - Left atrium: The atrium was mildly dilated. - Right ventricle: Systolic function was normal. - Right atrium: The atrium was normal in size. - Tricuspid valve: There was mild regurgitation. - Pulmonary arteries: Systolic pressure was within the normal   range. PA peak pressure: 35 mm Hg (S). - Inferior vena cava: The vessel was normal in size. - Pericardium, extracardiac: There was no pericardial effusion.  Transthoracic echocardiography.  M-mode, complete 2D, spectral Doppler, and color Doppler.  Birthdate:  Patient birthdate: [birth year].  Age:  Patient is 80 yr old.  Sex:  Gender:  female. BMI: 19.5 kg/m^2.  Blood pressure:     147/70  Patient status: Inpatient.  Study date:  Study date: 06/12/2014. Study time: 03:20 PM.  Location:  Bedside.  -------------------------------------------------------------------  ------------------------------------------------------------------- Left ventricle:  The cavity size was normal. There was mild concentric hypertrophy. Systolic function was normal. The estimated ejection fraction was in the range of 60% to 65%. Wall motion was normal; there were no regional wall motion abnormalities. Doppler parameters are consistent with abnormal left ventricular relaxation (grade 1 diastolic dysfunction). Doppler parameters are consistent with elevated ventricular end-diastolic filling pressure.  ------------------------------------------------------------------- Aortic valve:   Trileaflet; mildly thickened, mildly calcified leaflets. Mobility was not restricted.  Doppler:  Transvalvular velocity was within the normal range. There was no stenosis. There was mild  regurgitation.  ------------------------------------------------------------------- Aorta:  Aortic root: The aortic root was normal in size.  ------------------------------------------------------------------- Mitral valve:   Calcified annulus. Mobility was not restricted. Doppler:  Transvalvular velocity was within the normal range. There was no evidence for stenosis. There was mild regurgitation.    Peak gradient (D): 4 mm Hg.  ------------------------------------------------------------------- Left atrium:  The atrium was mildly dilated.  ------------------------------------------------------------------- Right ventricle:  The cavity size was normal. Wall thickness was normal. Systolic function was normal.  ------------------------------------------------------------------- Pulmonic valve:    Doppler:  Transvalvular velocity was within the normal range. There was no evidence for stenosis.  ------------------------------------------------------------------- Tricuspid valve:   Structurally normal valve.    Doppler: Transvalvular velocity was within the normal range. There was mild regurgitation.  ------------------------------------------------------------------- Pulmonary artery:   The main pulmonary artery was normal-sized. Systolic pressure was within the normal range.  ------------------------------------------------------------------- Right atrium:  The atrium was normal in size.  ------------------------------------------------------------------- Pericardium:  There was no pericardial effusion.  ------------------------------------------------------------------- Systemic veins: Inferior vena cava: The vessel was normal in size.  ------------------------------------------------------------------- Measurements   Left ventricle                              Value        Reference  LV ID, ED, PLAX chordal             (L)     31.4  mm     43 - 52  LV ID, ES, PLAX  chordal             (L)     21.8  mm     23 - 38  LV fx shortening, PLAX chordal              31    %      >=29  LV PW thickness, ED                         12.4  mm     ---------  IVS/LV PW ratio, ED                         0.97         <=1.3  LV e&', lateral                              12.2  cm/s   ---------  LV E/e&', lateral  8.2          ---------  LV e&', medial                               14.2  cm/s   ---------  LV E/e&', medial                             7.04         ---------  LV e&', average                              13.2  cm/s   ---------  LV E/e&', average                            7.58         ---------    Ventricular septum                          Value        Reference  IVS thickness, ED                           12    mm     ---------    Aorta                                       Value        Reference  Aortic root ID, ED                          33    mm     ---------    Left atrium                                 Value        Reference  LA ID, A-P, ES                              38    mm     ---------  LA ID/bsa, A-P                      (H)     2.44  cm/m^2 <=2.2  LA volume/bsa, S                            36.1  ml/m^2 ---------    Mitral valve                                Value        Reference  Mitral E-wave peak velocity                 100   cm/s   ---------  Mitral A-wave peak velocity                 104   cm/s   ---------  Mitral deceleration time                    190   ms     150 - 230  Mitral peak gradient, D                     4     mm Hg  ---------  Mitral E/A ratio, peak                      1            ---------    Pulmonary arteries                          Value        Reference  PA pressure, S, DP                  (H)     35    mm Hg  <=30    Tricuspid valve                             Value        Reference  Tricuspid regurg peak velocity              262   cm/s   ---------  Tricuspid peak RV-RA gradient                27    mm Hg  ---------  Tricuspid maximal regurg velocity,          262   cm/s   ---------  PISA    Systemic veins                              Value        Reference  Estimated CVP                               8     mm Hg  ---------    Right ventricle                             Value        Reference  RV pressure, S, DP                  (H)     35    mm Hg  <=30  CT ABDOMEN AND PELVIS WITHOUT CONTRAST   TECHNIQUE: Multidetector CT imaging of the abdomen and pelvis was performed following the standard protocol without IV contrast.   COMPARISON:  None.   FINDINGS: Mild cardiomegaly. Minimal dependent atelectasis or scarring in the lung bases. No effusions.   Mild fullness of the renal pelves bilaterally which could reflect extrarenal pelves or Mild chronic UPJ obstructions. Findings are similar to prior study. Large right lower pole renal cyst measures up to 7.5 cm. Multiple bilateral renal cysts present. No ureteral stones. Urinary bladder is unremarkable.   Liver, gallbladder, spleen, pancreas and adrenals have an unremarkable unenhanced appearance. Aorta and branch vessels are heavily calcified, non aneurysmal.   Sigmoid diverticulosis. No active diverticulitis. Moderate stool throughout the colon. Small bowel and stomach are decompressed, grossly unremarkable.  Numerous compression fractures in the thoracolumbar spine. Multi level Prior vertebral augmentation. Findings similar to prior study.   IMPRESSION: Mild fullness of the renal pelves bilaterally with decompressed ureters. This is stable since 2013. This could reflect extrarenal pelves or mild chronic UPJ obstruction. No ureteral stones.   Bilateral renal cysts.   Sigmoid diverticulosis.  No active diverticulitis.   No acute findings in the abdomen or pelvis.   MRI LUMBAR SPINE WITHOUT CONTRAST   TECHNIQUE: Multiplanar, multisequence MR imaging of the lumbar spine was performed. No  intravenous contrast was administered.   COMPARISON:  MRI 01/16/2012.  Abdominal pelvic CT 06/10/2014.   FINDINGS: The lumbar alignment is stable and near anatomic. Multiple chronic thoracolumbar compression deformities are again noted. The patient has undergone previous spinal augmentation at T11, T12 and L1. Fractures at those levels have healed. There are chronic superior endplate compression deformities at L2 and L3 and inferior endplate compression deformities at L4 and L5. There is no evidence of acute fracture.   The conus medullaris extends to the L1-2 level and appears normal. No paraspinal abnormalities are identified.Multiple renal cysts are present bilaterally.   L1-2: Mild disc bulging. No significant spinal stenosis or nerve root encroachment.   L2-3: Annular disc bulging eccentric to the left with mild facet and ligamentous hypertrophy. There is mild narrowing of the lateral recesses. The foramina are patent.   L3-4: Mild disc bulging, facet and ligamentous hypertrophy. No significant spinal stenosis or nerve root encroachment.   L4-5: Disc bulging and osteophytes are asymmetric to the right. There is mild facet and ligamentous hypertrophy. There is mild narrowing of the lateral recesses. The foramina are patent.   L5-S1: There are moderate facet degenerative changes with mild disc bulging eccentric to the right. There is mild inferior foraminal narrowing on the right without definite nerve root encroachment.   IMPRESSION: 1. Multiple healed thoracolumbar compression deformities status post spinal augmentation. No acute osseous findings. 2. Generally stable multilevel spondylosis. No high-grade spinal stenosis or nerve root encroachment demonstrated.   PORTABLE CHEST - 1 VIEW   COMPARISON:  01/17/2010   FINDINGS: Shallow inspiration. Borderline heart size with normal pulmonary vascularity. Peribronchial thickening and central interstitial changes  suggesting chronic bronchitis. No focal airspace disease or consolidation in the lungs. No blunting of costophrenic angles. No pneumothorax. Calcified and tortuous aorta. Degenerative changes in the spine and shoulders. Multiple previous kyphoplasties in the thoracic spine.   IMPRESSION: Chronic bronchitic changes. No evidence of active pulmonary disease. CT OF THE RIGHT HIP WITHOUT CONTRAST   TECHNIQUE: Multidetector CT imaging was performed according to the standard protocol. Multiplanar CT image reconstructions were also generated.   COMPARISON:  CT 06/10/2014   FINDINGS: Diffuse osteopenia and degenerative change present. An extremely subtle lucency is noted along the medial aspect of the base of the right femoral neck . Punctate area air is noted within the medullary bone adjacent to this region. These changes may be related to an extremely subtle fracture of the base of the right femoral neck. No displaced fractures noted. Sacrum and SI joints are intact were visualized. Sigmoid colonic diverticulosis. Right inguinal hernia with herniation of fat only.Peripheral vascular disease.   IMPRESSION: 1. Extremely subtle lucency is noted along the medial aspect of the base of the right femoral neck. Adjacent punctate area of air is noted in the medullary bone. These changes may be related to an extremely subtle fracture of the base of the right femoral neck. If further evaluation is needed  MRI can be obtained. 2. Diffuse severe osteopenia and degenerative change.     MR OF THE RIGHT HIP WITHOUT CONTRAST   TECHNIQUE: Multiplanar, multisequence MR imaging was performed. No intravenous contrast was administered.   COMPARISON:  Right hip CT 06/11/2014, lumbar MRI 06/10/2014, pelvic CT 06/10/2014 and whole body bone scan 12/23/2011.   FINDINGS: Bones: There is a fracture of the proximal right femur extending laterally from the lesser trochanter, corresponding with the  recent CT finding. This fracture extends into the intertrochanteric region, but appears incomplete. There is prominent surrounding bone marrow and soft tissue edema. There is a contralateral fracture of the left femoral neck medially with marrow edema, also appearing incomplete. In addition, there is a fracture of the left acetabular roof with a linear component best seen on the coronal images. The sacroiliac joints and symphysis pubis appear normal.   Articular cartilage and labrum   Articular cartilage: There are mild degenerative changes at both hips. There is no evidence of femoral head avascular necrosis.   Labrum: There is acetabular labral degeneration without paralabral cyst formation.   Joint or bursal effusion   Joint effusion: Small bilateral hip joint effusions.   Bursae: There is no focal periarticular fluid collection. However, there is moderate edema surrounding the proximal right femur fracture.   Muscles and tendons   Muscles and tendons: The gluteus, hamstring and iliopsoas tendons appear normal. The piriformis muscles appear symmetric.   Other findings   Miscellaneous: Bilateral renal cysts are partially imaged. There are chronic compression deformities within the lower lumbar spine. There is prominent fat within both inguinal canals which appears stable.   IMPRESSION: 1. There are multiple fractures involving the medial intertrochanteric region of the right femur, the left femoral neck medially and the left superior acetabulum. Given the presence of numerous thoracolumbar compression deformities on prior imaging, these are likely insufficiency fractures. No definite pathologic features or other suspicious marrow lesions identified. 2. The right femur fracture appears incomplete, although there is surrounding soft tissue edema. With continued weight-bearing, this is at risk to become a complete fracture.     DG OPERATIVE RIGHT HIP   TECHNIQUE: A  series of 4 intraoperative fluoroscopic spot images of the right hip is submitted.   COMPARISON:  MR 06/11/2014   FINDINGS: IM rod and sliding screw placement across the right femoral neck. Patchy femoral-popliteal arterial calcifications.   IMPRESSION: 1. Internal fixation across right femoral neck.    ASSESSMENT/PLAN:  Right hip fracture-status post ORIF. Continue rehabilitation. Acute blood loss anemia-status post transfusion and IV iron. Recheck hemoglobin. Atrial fibrillation-rate controlled Hypothyroidism-continue levothyroxine Hypertension-well-controlled Constipation-well controlled Check CBC with differential and BMP  I have reviewed patient's medical records received at admission/from hospitalization.  CPT CODE: 91478  Angela Cox, MD [facility] [phone]

## 2014-06-28 ENCOUNTER — Other Ambulatory Visit: Payer: Self-pay | Admitting: *Deleted

## 2014-06-28 MED ORDER — LORAZEPAM 0.5 MG PO TABS: ORAL_TABLET | ORAL | Status: DC

## 2014-06-28 NOTE — Telephone Encounter (Signed)
[facility] 

## 2014-06-30 ENCOUNTER — Emergency Department (HOSPITAL_COMMUNITY): Payer: Medicare Other

## 2014-06-30 ENCOUNTER — Encounter (HOSPITAL_COMMUNITY): Payer: Self-pay | Admitting: Emergency Medicine

## 2014-06-30 ENCOUNTER — Inpatient Hospital Stay (HOSPITAL_COMMUNITY)
Admission: EM | Admit: 2014-06-30 | Discharge: 2014-07-04 | DRG: 689 | Disposition: A | Discharge status: Skilled Nursing Facility | Payer: Medicare Other | Attending: Internal Medicine | Admitting: Internal Medicine

## 2014-06-30 DIAGNOSIS — Z9181 History of falling: Secondary | ICD-10-CM

## 2014-06-30 DIAGNOSIS — Z8673 Personal history of transient ischemic attack (TIA), and cerebral infarction without residual deficits: Secondary | ICD-10-CM

## 2014-06-30 DIAGNOSIS — Z66 Do not resuscitate: Secondary | ICD-10-CM | POA: Diagnosis present

## 2014-06-30 DIAGNOSIS — F29 Unspecified psychosis not due to a substance or known physiological condition: Secondary | ICD-10-CM | POA: Diagnosis not present

## 2014-06-30 DIAGNOSIS — I4891 Unspecified atrial fibrillation: Secondary | ICD-10-CM | POA: Diagnosis present

## 2014-06-30 DIAGNOSIS — R4182 Altered mental status, unspecified: Secondary | ICD-10-CM

## 2014-06-30 DIAGNOSIS — I480 Paroxysmal atrial fibrillation: Secondary | ICD-10-CM

## 2014-06-30 DIAGNOSIS — E039 Hypothyroidism, unspecified: Secondary | ICD-10-CM

## 2014-06-30 DIAGNOSIS — B9620 Unspecified Escherichia coli [E. coli] as the cause of diseases classified elsewhere: Secondary | ICD-10-CM

## 2014-06-30 DIAGNOSIS — D649 Anemia, unspecified: Secondary | ICD-10-CM

## 2014-06-30 DIAGNOSIS — M549 Dorsalgia, unspecified: Secondary | ICD-10-CM

## 2014-06-30 DIAGNOSIS — N390 Urinary tract infection, site not specified: Secondary | ICD-10-CM | POA: Diagnosis not present

## 2014-06-30 DIAGNOSIS — E876 Hypokalemia: Secondary | ICD-10-CM

## 2014-06-30 DIAGNOSIS — H9190 Unspecified hearing loss, unspecified ear: Secondary | ICD-10-CM | POA: Diagnosis present

## 2014-06-30 DIAGNOSIS — E871 Hypo-osmolality and hyponatremia: Secondary | ICD-10-CM

## 2014-06-30 DIAGNOSIS — Z87898 Personal history of other specified conditions: Secondary | ICD-10-CM

## 2014-06-30 DIAGNOSIS — G9340 Encephalopathy, unspecified: Secondary | ICD-10-CM

## 2014-06-30 DIAGNOSIS — S72141S Displaced intertrochanteric fracture of right femur, sequela: Secondary | ICD-10-CM

## 2014-06-30 DIAGNOSIS — F0391 Unspecified dementia with behavioral disturbance: Secondary | ICD-10-CM

## 2014-06-30 DIAGNOSIS — A498 Other bacterial infections of unspecified site: Secondary | ICD-10-CM | POA: Diagnosis present

## 2014-06-30 DIAGNOSIS — H548 Legal blindness, as defined in USA: Secondary | ICD-10-CM | POA: Diagnosis present

## 2014-06-30 DIAGNOSIS — D509 Iron deficiency anemia, unspecified: Secondary | ICD-10-CM | POA: Diagnosis present

## 2014-06-30 DIAGNOSIS — F0390 Unspecified dementia without behavioral disturbance: Secondary | ICD-10-CM | POA: Diagnosis present

## 2014-06-30 DIAGNOSIS — M810 Age-related osteoporosis without current pathological fracture: Secondary | ICD-10-CM

## 2014-06-30 DIAGNOSIS — I10 Essential (primary) hypertension: Secondary | ICD-10-CM

## 2014-06-30 DIAGNOSIS — Z79899 Other long term (current) drug therapy: Secondary | ICD-10-CM

## 2014-06-30 HISTORY — DX: Unspecified dementia, unspecified severity, without behavioral disturbance, psychotic disturbance, mood disturbance, and anxiety: F03.90

## 2014-06-30 LAB — I-STAT CHEM 8, ED
BUN: 14 mg/dL (ref 6–23)
CALCIUM ION: 1.04 mmol/L — AB (ref 1.13–1.30)
Chloride: 99 mEq/L (ref 96–112)
Creatinine, Ser: 0.7 mg/dL (ref 0.50–1.10)
Glucose, Bld: 142 mg/dL — ABNORMAL HIGH (ref 70–99)
HEMATOCRIT: 32 % — AB (ref 36.0–46.0)
HEMOGLOBIN: 10.9 g/dL — AB (ref 12.0–15.0)
Potassium: 3.6 mEq/L — ABNORMAL LOW (ref 3.7–5.3)
Sodium: 119 mEq/L — CL (ref 137–147)
TCO2: 24 mmol/L (ref 0–100)

## 2014-06-30 LAB — CBC WITH DIFFERENTIAL/PLATELET
Basophils Absolute: 0 10*3/uL (ref 0.0–0.1)
Basophils Relative: 0 % (ref 0–1)
Eosinophils Absolute: 0 10*3/uL (ref 0.0–0.7)
Eosinophils Relative: 0 % (ref 0–5)
HEMATOCRIT: 30.8 % — AB (ref 36.0–46.0)
Hemoglobin: 10.2 g/dL — ABNORMAL LOW (ref 12.0–15.0)
LYMPHS PCT: 11 % — AB (ref 12–46)
Lymphs Abs: 1 10*3/uL (ref 0.7–4.0)
MCH: 23.1 pg — ABNORMAL LOW (ref 26.0–34.0)
MCHC: 33.1 g/dL (ref 30.0–36.0)
MCV: 69.8 fL — AB (ref 78.0–100.0)
MONOS PCT: 12 % (ref 3–12)
Monocytes Absolute: 1 10*3/uL (ref 0.1–1.0)
NEUTROS ABS: 6.7 10*3/uL (ref 1.7–7.7)
Neutrophils Relative %: 77 % (ref 43–77)
Platelets: 468 10*3/uL — ABNORMAL HIGH (ref 150–400)
RBC: 4.41 MIL/uL (ref 3.87–5.11)
RDW: 24.6 % — ABNORMAL HIGH (ref 11.5–15.5)
WBC: 8.7 10*3/uL (ref 4.0–10.5)

## 2014-06-30 LAB — COMPREHENSIVE METABOLIC PANEL
ALK PHOS: 151 U/L — AB (ref 39–117)
ALT: 23 U/L (ref 0–35)
ANION GAP: 12 (ref 5–15)
AST: 23 U/L (ref 0–37)
Albumin: 3.5 g/dL (ref 3.5–5.2)
BILIRUBIN TOTAL: 0.7 mg/dL (ref 0.3–1.2)
BUN: 16 mg/dL (ref 6–23)
CHLORIDE: 87 meq/L — AB (ref 96–112)
CO2: 25 mEq/L (ref 19–32)
Calcium: 9.7 mg/dL (ref 8.4–10.5)
Creatinine, Ser: 0.65 mg/dL (ref 0.50–1.10)
GFR, EST AFRICAN AMERICAN: 86 mL/min — AB (ref >90)
GFR, EST NON AFRICAN AMERICAN: 74 mL/min — AB (ref >90)
GLUCOSE: 142 mg/dL — AB (ref 70–99)
POTASSIUM: 3.9 meq/L (ref 3.7–5.3)
Sodium: 124 mEq/L — ABNORMAL LOW (ref 137–147)
Total Protein: 6.6 g/dL (ref 6.0–8.3)

## 2014-06-30 LAB — MAGNESIUM: MAGNESIUM: 1.9 mg/dL (ref 1.5–2.5)

## 2014-06-30 MED ORDER — LORAZEPAM 2 MG/ML IJ SOLN
1.0000 mg | INTRAMUSCULAR | Status: AC
  Administered 2014-06-30: 1 mg via INTRAMUSCULAR
  Filled 2014-06-30: qty 1

## 2014-06-30 MED ORDER — SODIUM CHLORIDE 0.9 % IV BOLUS (SEPSIS): 500.0000 mL | Freq: Once | INTRAVENOUS | Status: DC

## 2014-06-30 NOTE — ED Notes (Signed)
Pt arrives from [facility] with abnormal lab, sodium 125. Dementia, combative, ripping out IVs, not allowing BP cuffs, thigh BP 201/130. 88 HR. GOLDEN TICKET AT BEDSIDE

## 2014-06-30 NOTE — ED Provider Notes (Signed)
CSN: [id number]     Arrival date & time 06/30/14  2200 History   First MD Initiated Contact with Patient 06/30/14 2217     Chief Complaint  Patient presents with  . Abnormal Lab     (Consider location/radiation/quality/duration/timing/severity/associated sxs/prior Treatment) HPI Comments: 80 year old female with history of dementia, high blood pressure, compression fracture, urine infection, anemia, atrial fibrillation, hip fracture presents with altered mental status and low sodium. Patient unable to give details do 2 altered mental/dementia. Patient was sent over for sodium 125. Patient combative, agitated on arrival. No further details known at this time. Nurse spoke with family who felt his more altered and speech is changed, unknown onset time.  The history is provided by the EMS personnel and medical records.    Past Medical History  Diagnosis Date  . Thyroid disease   . Hypertension   . Cannot hear   . Vision loss   . Dementia    Past Surgical History  Procedure Laterality Date  . Cesarean section    . Abdominal hysterectomy    . Kyphoplasty    . Intramedullary (im) nail intertrochanteric Right 06/13/2014    Procedure: Affix Nail/Right;  Surgeon: Jodi Marble, MD;  Location: [facility] OR;  Service: Orthopedics;  Laterality: Right;   Family History  Problem Relation Age of Onset  . Breast cancer Sister   . Cancer Brother   . Liver disease Son   . Lung cancer Son    History  Substance Use Topics  . Smoking status: Never Smoker   . Smokeless tobacco: Not on file  . Alcohol Use: No   OB History   Grav Para Term Preterm Abortions TAB SAB Ect Mult Living                 Review of Systems  Unable to perform ROS: Mental status change      Allergies  Aspirin  Home Medications   Prior to Admission medications   Medication Sig Start Date End Date Taking? Authorizing Provider  acetaminophen-codeine (TYLENOL #3) 300-30 MG per tablet Take 1 tablet by mouth every 6  (six) hours as needed for moderate pain.    Historical Provider, MD  carvedilol (COREG) 3.125 MG tablet Take 1 tablet (3.125 mg total) by mouth 2 (two) times daily with a meal. 06/15/14   Marinda Elk, MD  cholecalciferol (VITAMIN D) 1000 UNITS tablet Take 3,000 Units by mouth daily.    Historical Provider, MD  levothyroxine (SYNTHROID, LEVOTHROID) 50 MCG tablet Take 50 mcg by mouth every morning.     Historical Provider, MD  LORazepam (ATIVAN) 0.5 MG tablet Take one tablet by mouthy daily at 2pm for anxiety 06/28/14   Oneal Grout, MD  losartan (COZAAR) 50 MG tablet Take 50 mg by mouth every morning.     Historical Provider, MD  oxyCODONE-acetaminophen (PERCOCET/ROXICET) 5-325 MG per tablet Take 1 tablet by mouth every 4 (four) hours as needed for severe pain. 06/14/14   Jodi Marble, MD  polyethylene glycol [facility]) packet Take 17 g by mouth daily. 06/15/14   Marinda Elk, MD  traMADol (ULTRAM) 50 MG tablet Take 1 tablet (50 mg total) by mouth every 12 (twelve) hours. 06/15/14   Tiffany L Reed, DO  triamterene-hydrochlorothiazide (DYAZIDE) 37.5-25 MG per capsule Take 1 capsule by mouth every morning.     Historical Provider, MD   BP 176/98  Pulse 101  Temp(Src) 98.5 F (36.9 C) (Axillary)  Resp 17  SpO2  95% Physical Exam  Nursing note and vitals reviewed. Constitutional: She appears well-developed and well-nourished.  HENT:  Head: Normocephalic and atraumatic.  Dry mucous membranes  Eyes: Right eye exhibits no discharge. Left eye exhibits no discharge.  Neck: Normal range of motion. Neck supple. No tracheal deviation present.  Cardiovascular: Normal rate and regular rhythm.   Pulmonary/Chest: Effort normal and breath sounds normal.  Abdominal: Soft. She exhibits no distension. There is no tenderness. There is no guarding.  Musculoskeletal: She exhibits no edema.  Neurological: GCS eye subscore is 4. GCS verbal subscore is 3. GCS motor subscore is 5.  Difficult  exam, horizontal eye movement intact, patient moving with 5+ upper lower extremities bilateral, gross sensation appears to be intact however difficult with dementia and altered mental status. Patient will not follow commands. Poor vision.  Skin: Skin is warm. No rash noted.  Psychiatric:  Agitation, altered, dementia    ED Course  Procedures (including critical care time) Labs Review Labs Reviewed  CBC WITH DIFFERENTIAL - Abnormal; Notable for the following:    Hemoglobin 10.2 (*)    HCT 30.8 (*)    MCV 69.8 (*)    MCH 23.1 (*)    RDW 24.6 (*)    Platelets 468 (*)    Lymphocytes Relative 11 (*)    All other components within normal limits  COMPREHENSIVE METABOLIC PANEL - Abnormal; Notable for the following:    Sodium 124 (*)    Chloride 87 (*)    Glucose, Bld 142 (*)    Alkaline Phosphatase 151 (*)    GFR calc non Af Amer 74 (*)    GFR calc Af Amer 86 (*)    All other components within normal limits  I-STAT CHEM 8, ED - Abnormal; Notable for the following:    Sodium 119 (*)    Potassium 3.6 (*)    Glucose, Bld 142 (*)    Calcium, Ion 1.04 (*)    Hemoglobin 10.9 (*)    HCT 32.0 (*)    All other components within normal limits  MAGNESIUM  URINALYSIS, ROUTINE W REFLEX MICROSCOPIC    Imaging Review Ct Head Wo Contrast  06/30/2014   CLINICAL DATA:  Hyponatremia.  Dementia, altered mental status.  EXAM: CT HEAD WITHOUT CONTRAST  CT CERVICAL SPINE WITHOUT CONTRAST  TECHNIQUE: Multidetector CT imaging of the head and cervical spine was performed following the standard protocol without intravenous contrast. Multiplanar CT image reconstructions of the cervical spine were also generated.  COMPARISON:  None available for comparison at time of study interpretation.  FINDINGS: CT HEAD FINDINGS  Moderately motion degraded examination. Part technologist note, patient was unable to remain still during the examination.  The ventricles and sulci are normal for age. No intraparenchymal  hemorrhage, mass effect nor midline shift. Confluent supratentorial white matter hypodensities are non-specific suggest sequelae of chronic small vessel ischemic disease. Tiny hypodensities within the bilateral basal ganglia and thalamus. No acute large vascular territory infarcts.  No abnormal extra-axial fluid collections. Basal cisterns are patent. Moderate calcific atherosclerosis of the carotid siphons.  No skull fracture. The included ocular globes and orbital contents are non-suspicious. The mastoid aircells and included paranasal sinuses are well-aerated.  CT CERVICAL SPINE FINDINGS  Moderately motion degraded examination. Due to motion, there is a spurious duplicated appearance of the anterior arch of C1.  Mild C5 vertebral body height loss . The remaining cervical vertebra appear grossly intact. Maintenance of cervical lordosis. Moderate to severe multilevel disc degeneration. Bone mineral  density is decreased without destructive bony lesions. Lateral masses and alignment. Included prevertebral and paraspinal soft tissues are nonsuspicious ; fibronodular scarring RIGHT lung apex.  IMPRESSION: CT head: Moderately motion degraded examination without convincing evidence of acute intracranial process. If clinical suspicion persists, consider repeat examination when patient is better able to the tolerate imaging.  Involutional changes. Moderate to severe white matter changes suggest chronic small vessel ischemic disease. Basal ganglia and thalamus lacunar infarcts.  CT cervical spine: Moderately motion degraded examination. Mild C5 vertebral body height loss could be artifact or congenital variant, less likely acute injury. No malalignment. If clinical suspicion persists, consider repeat examination when patient is better able to the tolerate imaging.   Electronically Signed   By: Awilda Metro   On: 06/30/2014 23:32   Ct Cervical Spine Wo Contrast  06/30/2014   CLINICAL DATA:  Hyponatremia.  Dementia,  altered mental status.  EXAM: CT HEAD WITHOUT CONTRAST  CT CERVICAL SPINE WITHOUT CONTRAST  TECHNIQUE: Multidetector CT imaging of the head and cervical spine was performed following the standard protocol without intravenous contrast. Multiplanar CT image reconstructions of the cervical spine were also generated.  COMPARISON:  None available for comparison at time of study interpretation.  FINDINGS: CT HEAD FINDINGS  Moderately motion degraded examination. Part technologist note, patient was unable to remain still during the examination.  The ventricles and sulci are normal for age. No intraparenchymal hemorrhage, mass effect nor midline shift. Confluent supratentorial white matter hypodensities are non-specific suggest sequelae of chronic small vessel ischemic disease. Tiny hypodensities within the bilateral basal ganglia and thalamus. No acute large vascular territory infarcts.  No abnormal extra-axial fluid collections. Basal cisterns are patent. Moderate calcific atherosclerosis of the carotid siphons.  No skull fracture. The included ocular globes and orbital contents are non-suspicious. The mastoid aircells and included paranasal sinuses are well-aerated.  CT CERVICAL SPINE FINDINGS  Moderately motion degraded examination. Due to motion, there is a spurious duplicated appearance of the anterior arch of C1.  Mild C5 vertebral body height loss . The remaining cervical vertebra appear grossly intact. Maintenance of cervical lordosis. Moderate to severe multilevel disc degeneration. Bone mineral density is decreased without destructive bony lesions. Lateral masses and alignment. Included prevertebral and paraspinal soft tissues are nonsuspicious ; fibronodular scarring RIGHT lung apex.  IMPRESSION: CT head: Moderately motion degraded examination without convincing evidence of acute intracranial process. If clinical suspicion persists, consider repeat examination when patient is better able to the tolerate imaging.   Involutional changes. Moderate to severe white matter changes suggest chronic small vessel ischemic disease. Basal ganglia and thalamus lacunar infarcts.  CT cervical spine: Moderately motion degraded examination. Mild C5 vertebral body height loss could be artifact or congenital variant, less likely acute injury. No malalignment. If clinical suspicion persists, consider repeat examination when patient is better able to the tolerate imaging.   Electronically Signed   By: Awilda Metro   On: 06/30/2014 23:32   Dg Pelvis Portable  06/30/2014   CLINICAL DATA:  Fall. Pelvic pain. Initial encounter. Recent ORIF of a right femoral neck fracture.  EXAM: PORTABLE PELVIS 1-2 VIEWS  COMPARISON:  MRI pelvis 06/11/2014.  CT pelvis of that same date.  FINDINGS: ORIF of the previously identified right femoral neck fracture with anatomic alignment. Severe osseous demineralization. No visible acute fractures. Hip joints intact with symmetric moderate to severe inferolateral joint space narrowing. Sacroiliac joints and symphysis pubis intact.  IMPRESSION: No acute osseous abnormality.   Electronically Signed  By: Hulan Saas M.D.   On: 06/30/2014 22:53     EKG Interpretation None      MDM   Final diagnoses:  Altered mental status, unspecified altered mental status type  Hyponatremia  Dementia, with behavioral disturbance   Patient with worsening mental status to her baseline, sodium worsening since discharge. Family arrived and felt patient has not been well past few days, taking meds up until worsening agitation. Patient is on hydrochlorothiazide medication, no other new medicines.  Patient very agitated and difficulty completing medical evaluation and treatment, Ativan required for patient's safety as she pulled herself out of bed and fell with mild head injury. CT head done without obvious bleeding however artifact due to movement. C. collar in place however patient moving without difficulty and  not following commands. Family understands the need for readmission for further treatment. Plan hold hydrochlorothiazide medications.  Discussed the case with family and hospitalist triad. Small fluid bolus given in ER.  The patients results and plan were reviewed and discussed.   Any x-rays performed were personally reviewed by myself.   Differential diagnosis were considered with the presenting HPI.  Medications  sodium chloride 0.9 % bolus 500 mL (not administered)  LORazepam (ATIVAN) injection 1 mg (1 mg Intramuscular Given 06/30/14 2235)    Filed Vitals:   06/30/14 2324 06/30/14 2328 06/30/14 2330 06/30/14 2332  BP:      Pulse: 93 83 96 85  Temp:      TempSrc:      Resp: SpO2:        Final diagnoses:  Altered mental status, unspecified altered mental status type  Hyponatremia  Dementia, with behavioral disturbance    Admission/ observation were discussed with the admitting physician, patient and/or family and they are comfortable with the plan.     Enid Skeens, MD 07/01/14 Moses Manners

## 2014-06-30 NOTE — ED Notes (Signed)
Pt found on floor sitting by [place] Bing, Janett Billow RN, Nolon Rod RN. C-collar applied while maintaining C-spine pt placed in bed with backboard using log roll. Pt moving all extremities, no new bruising, lacerations or deformities noted. Pt in NAD. Dr. Jodi Mourning at bedside assessing patient.

## 2014-07-01 ENCOUNTER — Encounter (HOSPITAL_COMMUNITY): Payer: Self-pay | Admitting: Internal Medicine

## 2014-07-01 DIAGNOSIS — G9340 Encephalopathy, unspecified: Secondary | ICD-10-CM | POA: Diagnosis present

## 2014-07-01 DIAGNOSIS — H9190 Unspecified hearing loss, unspecified ear: Secondary | ICD-10-CM | POA: Diagnosis present

## 2014-07-01 DIAGNOSIS — F0390 Unspecified dementia without behavioral disturbance: Secondary | ICD-10-CM | POA: Diagnosis present

## 2014-07-01 DIAGNOSIS — D509 Iron deficiency anemia, unspecified: Secondary | ICD-10-CM | POA: Diagnosis present

## 2014-07-01 DIAGNOSIS — Z8673 Personal history of transient ischemic attack (TIA), and cerebral infarction without residual deficits: Secondary | ICD-10-CM | POA: Diagnosis not present

## 2014-07-01 DIAGNOSIS — E871 Hypo-osmolality and hyponatremia: Secondary | ICD-10-CM | POA: Diagnosis present

## 2014-07-01 DIAGNOSIS — A498 Other bacterial infections of unspecified site: Secondary | ICD-10-CM | POA: Diagnosis present

## 2014-07-01 DIAGNOSIS — H548 Legal blindness, as defined in USA: Secondary | ICD-10-CM | POA: Diagnosis present

## 2014-07-01 DIAGNOSIS — I4891 Unspecified atrial fibrillation: Secondary | ICD-10-CM | POA: Diagnosis present

## 2014-07-01 DIAGNOSIS — N390 Urinary tract infection, site not specified: Principal | ICD-10-CM | POA: Diagnosis present

## 2014-07-01 DIAGNOSIS — F29 Unspecified psychosis not due to a substance or known physiological condition: Secondary | ICD-10-CM | POA: Diagnosis present

## 2014-07-01 DIAGNOSIS — E876 Hypokalemia: Secondary | ICD-10-CM | POA: Diagnosis present

## 2014-07-01 DIAGNOSIS — Z66 Do not resuscitate: Secondary | ICD-10-CM | POA: Diagnosis present

## 2014-07-01 DIAGNOSIS — Z9181 History of falling: Secondary | ICD-10-CM | POA: Diagnosis not present

## 2014-07-01 DIAGNOSIS — I10 Essential (primary) hypertension: Secondary | ICD-10-CM | POA: Diagnosis present

## 2014-07-01 DIAGNOSIS — E039 Hypothyroidism, unspecified: Secondary | ICD-10-CM | POA: Diagnosis present

## 2014-07-01 DIAGNOSIS — M810 Age-related osteoporosis without current pathological fracture: Secondary | ICD-10-CM | POA: Diagnosis present

## 2014-07-01 DIAGNOSIS — Z79899 Other long term (current) drug therapy: Secondary | ICD-10-CM | POA: Diagnosis not present

## 2014-07-01 DIAGNOSIS — R4182 Altered mental status, unspecified: Secondary | ICD-10-CM

## 2014-07-01 LAB — URINALYSIS, ROUTINE W REFLEX MICROSCOPIC
BILIRUBIN URINE: NEGATIVE
Glucose, UA: NEGATIVE mg/dL
KETONES UR: 15 mg/dL — AB
NITRITE: POSITIVE — AB
PROTEIN: 30 mg/dL — AB
Specific Gravity, Urine: 1.015 (ref 1.005–1.030)
Urobilinogen, UA: 0.2 mg/dL (ref 0.0–1.0)
pH: 6 (ref 5.0–8.0)

## 2014-07-01 LAB — BASIC METABOLIC PANEL
ANION GAP: 15 (ref 5–15)
Anion gap: 16 — ABNORMAL HIGH (ref 5–15)
BUN: 19 mg/dL (ref 6–23)
BUN: 19 mg/dL (ref 6–23)
CHLORIDE: 90 meq/L — AB (ref 96–112)
CO2: 19 mEq/L (ref 19–32)
CO2: 23 meq/L (ref 19–32)
Calcium: 8.7 mg/dL (ref 8.4–10.5)
Calcium: 8.7 mg/dL (ref 8.4–10.5)
Chloride: 91 mEq/L — ABNORMAL LOW (ref 96–112)
Creatinine, Ser: 0.59 mg/dL (ref 0.50–1.10)
Creatinine, Ser: 0.6 mg/dL (ref 0.50–1.10)
GFR calc Af Amer: 88 mL/min — ABNORMAL LOW (ref >90)
GFR calc non Af Amer: 77 mL/min — ABNORMAL LOW (ref >90)
GFR calc non Af Amer: 76 mL/min — ABNORMAL LOW (ref >90)
GFR, EST AFRICAN AMERICAN: 89 mL/min — AB (ref >90)
GLUCOSE: 138 mg/dL — AB (ref 70–99)
Glucose, Bld: 130 mg/dL — ABNORMAL HIGH (ref 70–99)
POTASSIUM: 3.8 meq/L (ref 3.7–5.3)
Potassium: 3.3 mEq/L — ABNORMAL LOW (ref 3.7–5.3)
Sodium: 125 mEq/L — ABNORMAL LOW (ref 137–147)
Sodium: 129 mEq/L — ABNORMAL LOW (ref 137–147)

## 2014-07-01 LAB — OSMOLALITY, URINE: OSMOLALITY UR: 443 mosm/kg (ref 390–1090)

## 2014-07-01 LAB — CBC
HCT: 26.8 % — ABNORMAL LOW (ref 36.0–46.0)
Hemoglobin: 8.9 g/dL — ABNORMAL LOW (ref 12.0–15.0)
MCH: 23.2 pg — AB (ref 26.0–34.0)
MCHC: 33.2 g/dL (ref 30.0–36.0)
MCV: 70 fL — ABNORMAL LOW (ref 78.0–100.0)
Platelets: 406 10*3/uL — ABNORMAL HIGH (ref 150–400)
RBC: 3.83 MIL/uL — AB (ref 3.87–5.11)
RDW: 25 % — AB (ref 11.5–15.5)
WBC: 12.3 10*3/uL — AB (ref 4.0–10.5)

## 2014-07-01 LAB — URINE MICROSCOPIC-ADD ON

## 2014-07-01 LAB — CREATININE, URINE, RANDOM: CREATININE, URINE: 100.1 mg/dL

## 2014-07-01 LAB — SODIUM, URINE, RANDOM: Sodium, Ur: 25 mEq/L

## 2014-07-01 LAB — MRSA PCR SCREENING: MRSA by PCR: NEGATIVE

## 2014-07-01 MED ORDER — SODIUM CHLORIDE 0.9 % IV SOLN
INTRAVENOUS | Status: AC
  Administered 2014-07-01: 02:00:00 via INTRAVENOUS

## 2014-07-01 MED ORDER — VITAMIN D3 25 MCG (1000 UNIT) PO TABS
3000.0000 [IU] | ORAL_TABLET | Freq: Every day | ORAL | Status: DC
  Administered 2014-07-01 – 2014-07-04 (×4): 3000 [IU] via ORAL
  Filled 2014-07-01 (×4): qty 3

## 2014-07-01 MED ORDER — SODIUM CHLORIDE 0.9 % IJ SOLN
3.0000 mL | Freq: Two times a day (BID) | INTRAMUSCULAR | Status: DC
  Administered 2014-07-01 – 2014-07-03 (×3): 3 mL via INTRAVENOUS

## 2014-07-01 MED ORDER — ACETAMINOPHEN 650 MG RE SUPP: 650.0000 mg | Freq: Four times a day (QID) | RECTAL | Status: DC | PRN

## 2014-07-01 MED ORDER — LORAZEPAM 2 MG/ML IJ SOLN
0.5000 mg | Freq: Once | INTRAMUSCULAR | Status: AC
  Administered 2014-07-01: 0.5 mg via INTRAVENOUS
  Filled 2014-07-01: qty 1

## 2014-07-01 MED ORDER — ONDANSETRON HCL 4 MG PO TABS: 4.0000 mg | ORAL_TABLET | Freq: Four times a day (QID) | ORAL | Status: DC | PRN

## 2014-07-01 MED ORDER — CARVEDILOL 3.125 MG PO TABS
3.1250 mg | ORAL_TABLET | Freq: Two times a day (BID) | ORAL | Status: DC
  Administered 2014-07-01 – 2014-07-04 (×7): 3.125 mg via ORAL
  Filled 2014-07-01 (×9): qty 1

## 2014-07-01 MED ORDER — ACETAMINOPHEN 325 MG PO TABS
650.0000 mg | ORAL_TABLET | Freq: Four times a day (QID) | ORAL | Status: DC | PRN
  Administered 2014-07-04: 650 mg via ORAL
  Filled 2014-07-01: qty 2

## 2014-07-01 MED ORDER — LOSARTAN POTASSIUM 50 MG PO TABS
50.0000 mg | ORAL_TABLET | Freq: Every day | ORAL | Status: DC
  Administered 2014-07-01 – 2014-07-04 (×4): 50 mg via ORAL
  Filled 2014-07-01 (×4): qty 1

## 2014-07-01 MED ORDER — LEVOTHYROXINE SODIUM 50 MCG PO TABS
50.0000 ug | ORAL_TABLET | Freq: Every day | ORAL | Status: DC
  Administered 2014-07-01 – 2014-07-04 (×4): 50 ug via ORAL
  Filled 2014-07-01 (×5): qty 1

## 2014-07-01 MED ORDER — ONDANSETRON HCL 4 MG/2ML IJ SOLN: 4.0000 mg | Freq: Four times a day (QID) | INTRAMUSCULAR | Status: DC | PRN

## 2014-07-01 MED ORDER — TRAMADOL HCL 50 MG PO TABS
50.0000 mg | ORAL_TABLET | Freq: Two times a day (BID) | ORAL | Status: DC
  Administered 2014-07-01 – 2014-07-02 (×3): 50 mg via ORAL
  Filled 2014-07-01 (×3): qty 1

## 2014-07-01 MED ORDER — POLYETHYLENE GLYCOL 3350 17 G PO PACK
17.0000 g | PACK | Freq: Every day | ORAL | Status: DC
  Administered 2014-07-01 – 2014-07-02 (×2): 17 g via ORAL
  Filled 2014-07-01 (×4): qty 1

## 2014-07-01 MED ORDER — DEXTROSE 5 % IV SOLN
1.0000 g | INTRAVENOUS | Status: DC
  Administered 2014-07-01 – 2014-07-04 (×4): 1 g via INTRAVENOUS
  Filled 2014-07-01 (×5): qty 10

## 2014-07-01 MED ORDER — ALBUTEROL SULFATE (2.5 MG/3ML) 0.083% IN NEBU
2.5000 mg | INHALATION_SOLUTION | RESPIRATORY_TRACT | Status: DC | PRN
  Filled 2014-07-01: qty 3

## 2014-07-01 MED ORDER — OXYCODONE-ACETAMINOPHEN 5-325 MG PO TABS
1.0000 | ORAL_TABLET | ORAL | Status: DC | PRN
  Administered 2014-07-02: 1 via ORAL
  Filled 2014-07-01: qty 1

## 2014-07-01 MED ORDER — ENOXAPARIN SODIUM 30 MG/0.3ML ~~LOC~~ SOLN
30.0000 mg | SUBCUTANEOUS | Status: DC
  Administered 2014-07-01 – 2014-07-04 (×4): 30 mg via SUBCUTANEOUS
  Filled 2014-07-01 (×4): qty 0.3

## 2014-07-01 MED ORDER — ACETAMINOPHEN-CODEINE #3 300-30 MG PO TABS
1.0000 | ORAL_TABLET | Freq: Four times a day (QID) | ORAL | Status: DC | PRN
  Administered 2014-07-01: 1 via ORAL
  Filled 2014-07-01: qty 1

## 2014-07-01 NOTE — Progress Notes (Addendum)
[facility] PROGRESS NOTE  [name] ZOX:[id number] DOB: [birth year] DOA: 06/30/2014 PCP: Charlette Caffey, MD  Assessment/Plan: Principal Problem:   Hyponatremia Active Problems:   Hypothyroidism   Osteoporosis   Hypertension   Anemia   Paroxysmal a-fib   Acute encephalopathy   UTI (lower urinary tract infection)    Acute encephalopathy complicated by dementia , hyponatremia Sodium 125, Likely due to hyponatremia and urinary tract infection. Continue to monitor. Continue bedside sitter due to fall risk.   Hyponatremia  Unclear etiology, suspect is likely due to triamterene/hydrochlorothiazide, which has been discontinued. Continue losartan. . Gently hydrate the patient on IV fluids, started on fluid restriction.  Recent TSH was normal  Repeat BMP tonight  Urinary tract infection  Started the patient on ceftriaxone. Further titration of antibiotics depending on culture results and sensitivities.   Fall  Request PT and OT evaluation. Head CT and C-spine CT have moderate motion degraded exam, no obvious signs of fractures.   Hypothyroidism  Continue levothyroxine. Recent TSH normal  Hypertension  Continue carvedilol and losartan. Discontinue triamterene/hydrochlorothiazide. Stable.   Paroxysmal atrial fibrillation  Rate controlled. Currently in sinus rhythm. Not anticoagulated due to fall risk.   Osteoporosis  Stable.   Anemia  Microcytic anemia. Baseline hemoglobin around 9.0, Hemoglobin stable. Continue to monitor.   Recent history of right hip ORIF  Continue PT/OT.  Prophylaxis  Lovenox.   CODE STATUS  DO NOT RESUSCITATE/DO NOT INTUBATE. Patient has gold out of facility DO NOT RESUSCITATE form. Also confirmed with daughter on the telephone  Family Communication: family updated about patient's clinical progress Disposition Plan:  As above    Brief narrative: [name] is a 80 y.o. Caucasian female with history of hypertension, thyroid  disease, dementia, vision and hearing loss, iron deficiency anemia, and paroxysmal atrial fibrillation not on anticoagulation due to fall risk who presents with the above complaints. Patient was recently hospitalized from 06/10/2014 to 06/15/2014 for intertrochanteric fracture of right hip, patient underwent ORIF on 06/13/2014. She was subsequently discharged to [facility] skilled nursing facility for rehabilitation. Patient due to her altered mental status cannot provide any history. Most of the history was obtained from the daughter on the telephone. It was noted that the patient was becoming increasingly confused at the skilled nursing facility, her sodium was checked which was low as a result she was brought to the emergency department for further evaluation. In the emergency department, daughter noted that patient had a fall and as a result was placed in a neck brace and had CT of her head and neck. In the emergency department, she was found to be hypernatremic with a sodium of 124 (i-STAT Chem-8 was 119, less accurate) and urinalysis suggest urinary tract infection. Hospitalist service was asked to admit the patient for further care and management   Consultants:  None  Procedures:  None  Antibiotics:  None  HPI/Subjective: " Leave me alone"don't touch me  Objective: Filed Vitals:   07/01/14 0200 07/01/14 0221 07/01/14 0252 07/01/14 0653  BP: 153/63   157/78  Pulse: 92   91  Temp: 97.6 F (36.4 C)   97.9 F (36.6 C)  TempSrc: Axillary   Axillary  Resp:    20  Weight: 48.716 kg (107 lb 6.4 oz)     SpO2: 86% 90% 95% 96%    Intake/Output Summary (Last 24 hours) at 07/01/14 1032 Last data filed at 07/01/14 0918  Gross per 24 hour  Intake  0 ml  Output    375 ml  Net   -375 ml    Exam:  General: alert & oriented, extremely hard of hearing, legally blind, chronically ill-appearing Cardiovascular: RRR, nl S1 s2  Respiratory: Decreased breath sounds at the bases, scattered  rhonchi, no crackles  Abdomen: soft +BS NT/ND, no masses palpable  Extremities: No cyanosis and no edema      Data Reviewed: Basic Metabolic Panel:  Recent Labs Lab 06/30/14 2220 06/30/14 2229 07/01/14 0510  NA 124* 119* 125*  K 3.9 3.6* 3.8  CL 87* 99 90*  CO2 25  --  19  GLUCOSE 142* 142* 138*  BUN CREATININE 0.65 0.70 0.59  CALCIUM 9.7  --  8.7  MG 1.9  --   --     Liver Function Tests:  Recent Labs Lab 06/30/14 2220  AST 23  ALT 23  ALKPHOS 151*  BILITOT 0.7  PROT 6.6  ALBUMIN 3.5   No results found for this basename: LIPASE, AMYLASE,  in the last 168 hours No results found for this basename: AMMONIA,  in the last 168 hours  CBC:  Recent Labs Lab 06/30/14 2220 06/30/14 2229 07/01/14 0755  WBC 8.7  --  12.3*  NEUTROABS 6.7  --   --   HGB 10.2* 10.9* 8.9*  HCT 30.8* 32.0* 26.8*  MCV 69.8*  --  70.0*  PLT 468*  --  406*    Cardiac Enzymes: No results found for this basename: CKTOTAL, CKMB, CKMBINDEX, TROPONINI,  in the last 168 hours BNP (last 3 results) No results found for this basename: PROBNP,  in the last 8760 hours   CBG: No results found for this basename: GLUCAP,  in the last 168 hours  Recent Results (from the past 240 hour(s))  MRSA PCR SCREENING     Status: None   Collection Time    07/01/14  2:36 AM      Result Value Ref Range Status   MRSA by PCR NEGATIVE  NEGATIVE Final   Comment:            The GeneXpert MRSA Assay (FDA     approved for NASAL specimens     only), is one component of a     comprehensive MRSA colonization     surveillance program. It is not     intended to diagnose MRSA     infection nor to guide or     monitor treatment for     MRSA infections.     Studies: Ct Abdomen Pelvis Wo Contrast  06/10/2014   CLINICAL DATA:  Right flank pain.  Nausea.  EXAM: CT ABDOMEN AND PELVIS WITHOUT CONTRAST  TECHNIQUE: Multidetector CT imaging of the abdomen and pelvis was performed following the standard  protocol without IV contrast.  COMPARISON:  None.  FINDINGS: Mild cardiomegaly. Minimal dependent atelectasis or scarring in the lung bases. No effusions.  Mild fullness of the renal pelves bilaterally which could reflect extrarenal pelves or Mild chronic UPJ obstructions. Findings are similar to prior study. Large right lower pole renal cyst measures up to 7.5 cm. Multiple bilateral renal cysts present. No ureteral stones. Urinary bladder is unremarkable.  Liver, gallbladder, spleen, pancreas and adrenals have an unremarkable unenhanced appearance. Aorta and branch vessels are heavily calcified, non aneurysmal.  Sigmoid diverticulosis. No active diverticulitis. Moderate stool throughout the colon. Small bowel and stomach are decompressed, grossly unremarkable.  Numerous compression fractures in the thoracolumbar spine. Multi level  Prior vertebral augmentation. Findings similar to prior study.  IMPRESSION: Mild fullness of the renal pelves bilaterally with decompressed ureters. This is stable since 2013. This could reflect extrarenal pelves or mild chronic UPJ obstruction. No ureteral stones.  Bilateral renal cysts.  Sigmoid diverticulosis.  No active diverticulitis.  No acute findings in the abdomen or pelvis.   Electronically Signed   By: Charlett Nose M.D.   On: 06/10/2014 14:51   Dg Hip Operative Right  06/13/2014   CLINICAL DATA:  ORIF right hip  EXAM: DG OPERATIVE RIGHT HIP  TECHNIQUE: A series of 4 intraoperative fluoroscopic spot images of the right hip is submitted.  COMPARISON:  MR 06/11/2014  FINDINGS: IM rod and sliding screw placement across the right femoral neck. Patchy femoral-popliteal arterial calcifications.  IMPRESSION: 1. Internal fixation across right femoral neck.   Electronically Signed   By: Oley Balm M.D.   On: 06/13/2014 18:10   Ct Head Wo Contrast  06/30/2014   CLINICAL DATA:  Hyponatremia.  Dementia, altered mental status.  EXAM: CT HEAD WITHOUT CONTRAST  CT CERVICAL SPINE  WITHOUT CONTRAST  TECHNIQUE: Multidetector CT imaging of the head and cervical spine was performed following the standard protocol without intravenous contrast. Multiplanar CT image reconstructions of the cervical spine were also generated.  COMPARISON:  None available for comparison at time of study interpretation.  FINDINGS: CT HEAD FINDINGS  Moderately motion degraded examination. Part technologist note, patient was unable to remain still during the examination.  The ventricles and sulci are normal for age. No intraparenchymal hemorrhage, mass effect nor midline shift. Confluent supratentorial white matter hypodensities are non-specific suggest sequelae of chronic small vessel ischemic disease. Tiny hypodensities within the bilateral basal ganglia and thalamus. No acute large vascular territory infarcts.  No abnormal extra-axial fluid collections. Basal cisterns are patent. Moderate calcific atherosclerosis of the carotid siphons.  No skull fracture. The included ocular globes and orbital contents are non-suspicious. The mastoid aircells and included paranasal sinuses are well-aerated.  CT CERVICAL SPINE FINDINGS  Moderately motion degraded examination. Due to motion, there is a spurious duplicated appearance of the anterior arch of C1.  Mild C5 vertebral body height loss . The remaining cervical vertebra appear grossly intact. Maintenance of cervical lordosis. Moderate to severe multilevel disc degeneration. Bone mineral density is decreased without destructive bony lesions. Lateral masses and alignment. Included prevertebral and paraspinal soft tissues are nonsuspicious ; fibronodular scarring RIGHT lung apex.  IMPRESSION: CT head: Moderately motion degraded examination without convincing evidence of acute intracranial process. If clinical suspicion persists, consider repeat examination when patient is better able to the tolerate imaging.  Involutional changes. Moderate to severe white matter changes suggest  chronic small vessel ischemic disease. Basal ganglia and thalamus lacunar infarcts.  CT cervical spine: Moderately motion degraded examination. Mild C5 vertebral body height loss could be artifact or congenital variant, less likely acute injury. No malalignment. If clinical suspicion persists, consider repeat examination when patient is better able to the tolerate imaging.   Electronically Signed   By: Awilda Metro   On: 06/30/2014 23:32   Ct Cervical Spine Wo Contrast  06/30/2014   CLINICAL DATA:  Hyponatremia.  Dementia, altered mental status.  EXAM: CT HEAD WITHOUT CONTRAST  CT CERVICAL SPINE WITHOUT CONTRAST  TECHNIQUE: Multidetector CT imaging of the head and cervical spine was performed following the standard protocol without intravenous contrast. Multiplanar CT image reconstructions of the cervical spine were also generated.  COMPARISON:  None available for comparison at  time of study interpretation.  FINDINGS: CT HEAD FINDINGS  Moderately motion degraded examination. Part technologist note, patient was unable to remain still during the examination.  The ventricles and sulci are normal for age. No intraparenchymal hemorrhage, mass effect nor midline shift. Confluent supratentorial white matter hypodensities are non-specific suggest sequelae of chronic small vessel ischemic disease. Tiny hypodensities within the bilateral basal ganglia and thalamus. No acute large vascular territory infarcts.  No abnormal extra-axial fluid collections. Basal cisterns are patent. Moderate calcific atherosclerosis of the carotid siphons.  No skull fracture. The included ocular globes and orbital contents are non-suspicious. The mastoid aircells and included paranasal sinuses are well-aerated.  CT CERVICAL SPINE FINDINGS  Moderately motion degraded examination. Due to motion, there is a spurious duplicated appearance of the anterior arch of C1.  Mild C5 vertebral body height loss . The remaining cervical vertebra appear  grossly intact. Maintenance of cervical lordosis. Moderate to severe multilevel disc degeneration. Bone mineral density is decreased without destructive bony lesions. Lateral masses and alignment. Included prevertebral and paraspinal soft tissues are nonsuspicious ; fibronodular scarring RIGHT lung apex.  IMPRESSION: CT head: Moderately motion degraded examination without convincing evidence of acute intracranial process. If clinical suspicion persists, consider repeat examination when patient is better able to the tolerate imaging.  Involutional changes. Moderate to severe white matter changes suggest chronic small vessel ischemic disease. Basal ganglia and thalamus lacunar infarcts.  CT cervical spine: Moderately motion degraded examination. Mild C5 vertebral body height loss could be artifact or congenital variant, less likely acute injury. No malalignment. If clinical suspicion persists, consider repeat examination when patient is better able to the tolerate imaging.   Electronically Signed   By: Awilda Metro   On: 06/30/2014 23:32   Mr Lumbar Spine Wo Contrast  06/10/2014   CLINICAL DATA:  80 year old with low back pain radiating into the left leg. History of vertebroplasty. Urinary incontinence.  EXAM: MRI LUMBAR SPINE WITHOUT CONTRAST  TECHNIQUE: Multiplanar, multisequence MR imaging of the lumbar spine was performed. No intravenous contrast was administered.  COMPARISON:  MRI 01/16/2012.  Abdominal pelvic CT 06/10/2014.  FINDINGS: The lumbar alignment is stable and near anatomic. Multiple chronic thoracolumbar compression deformities are again noted. The patient has undergone previous spinal augmentation at T11, T12 and L1. Fractures at those levels have healed. There are chronic superior endplate compression deformities at L2 and L3 and inferior endplate compression deformities at L4 and L5. There is no evidence of acute fracture.  The conus medullaris extends to the L1-2 level and appears normal.  No paraspinal abnormalities are identified.Multiple renal cysts are present bilaterally.  L1-2: Mild disc bulging. No significant spinal stenosis or nerve root encroachment.  L2-3: Annular disc bulging eccentric to the left with mild facet and ligamentous hypertrophy. There is mild narrowing of the lateral recesses. The foramina are patent.  L3-4: Mild disc bulging, facet and ligamentous hypertrophy. No significant spinal stenosis or nerve root encroachment.  L4-5: Disc bulging and osteophytes are asymmetric to the right. There is mild facet and ligamentous hypertrophy. There is mild narrowing of the lateral recesses. The foramina are patent.  L5-S1: There are moderate facet degenerative changes with mild disc bulging eccentric to the right. There is mild inferior foraminal narrowing on the right without definite nerve root encroachment.  IMPRESSION: 1. Multiple healed thoracolumbar compression deformities status post spinal augmentation. No acute osseous findings. 2. Generally stable multilevel spondylosis. No high-grade spinal stenosis or nerve root encroachment demonstrated.   Electronically Signed  By: Roxy Horseman M.D.   On: 06/10/2014 17:21   Dg Pelvis Portable  06/30/2014   CLINICAL DATA:  Fall. Pelvic pain. Initial encounter. Recent ORIF of a right femoral neck fracture.  EXAM: PORTABLE PELVIS 1-2 VIEWS  COMPARISON:  MRI pelvis 06/11/2014.  CT pelvis of that same date.  FINDINGS: ORIF of the previously identified right femoral neck fracture with anatomic alignment. Severe osseous demineralization. No visible acute fractures. Hip joints intact with symmetric moderate to severe inferolateral joint space narrowing. Sacroiliac joints and symphysis pubis intact.  IMPRESSION: No acute osseous abnormality.   Electronically Signed   By: Hulan Saas M.D.   On: 06/30/2014 22:53   Ct Hip Right Wo Contrast  06/11/2014   CLINICAL DATA:  Hip pain.  EXAM: CT OF THE RIGHT HIP WITHOUT CONTRAST  TECHNIQUE:  Multidetector CT imaging was performed according to the standard protocol. Multiplanar CT image reconstructions were also generated.  COMPARISON:  CT 06/10/2014  FINDINGS: Diffuse osteopenia and degenerative change present. An extremely subtle lucency is noted along the medial aspect of the base of the right femoral neck . Punctate area air is noted within the medullary bone adjacent to this region. These changes may be related to an extremely subtle fracture of the base of the right femoral neck. No displaced fractures noted. Sacrum and SI joints are intact were visualized. Sigmoid colonic diverticulosis. Right inguinal hernia with herniation of fat only.Peripheral vascular disease.  IMPRESSION: 1. Extremely subtle lucency is noted along the medial aspect of the base of the right femoral neck. Adjacent punctate area of air is noted in the medullary bone. These changes may be related to an extremely subtle fracture of the base of the right femoral neck. If further evaluation is needed MRI can be obtained. 2. Diffuse severe osteopenia and degenerative change.   Electronically Signed   By: Maisie Fus  Register   On: 06/11/2014 11:19   Mr Hip Right Wo Contrast  06/11/2014   CLINICAL DATA:  Persistent right hip pain. No reported acute injury or history of malignancy.  EXAM: MR OF THE RIGHT HIP WITHOUT CONTRAST  TECHNIQUE: Multiplanar, multisequence MR imaging was performed. No intravenous contrast was administered.  COMPARISON:  Right hip CT 06/11/2014, lumbar MRI 06/10/2014, pelvic CT 06/10/2014 and whole body bone scan 12/23/2011.  FINDINGS: Bones: There is a fracture of the proximal right femur extending laterally from the lesser trochanter, corresponding with the recent CT finding. This fracture extends into the intertrochanteric region, but appears incomplete. There is prominent surrounding bone marrow and soft tissue edema. There is a contralateral fracture of the left femoral neck medially with marrow edema, also  appearing incomplete. In addition, there is a fracture of the left acetabular roof with a linear component best seen on the coronal images. The sacroiliac joints and symphysis pubis appear normal.  Articular cartilage and labrum  Articular cartilage: There are mild degenerative changes at both hips. There is no evidence of femoral head avascular necrosis.  Labrum: There is acetabular labral degeneration without paralabral cyst formation.  Joint or bursal effusion  Joint effusion: Small bilateral hip joint effusions.  Bursae: There is no focal periarticular fluid collection. However, there is moderate edema surrounding the proximal right femur fracture.  Muscles and tendons  Muscles and tendons: The gluteus, hamstring and iliopsoas tendons appear normal. The piriformis muscles appear symmetric.  Other findings  Miscellaneous: Bilateral renal cysts are partially imaged. There are chronic compression deformities within the lower lumbar spine. There is prominent  fat within both inguinal canals which appears stable.  IMPRESSION: 1. There are multiple fractures involving the medial intertrochanteric region of the right femur, the left femoral neck medially and the left superior acetabulum. Given the presence of numerous thoracolumbar compression deformities on prior imaging, these are likely insufficiency fractures. No definite pathologic features or other suspicious marrow lesions identified. 2. The right femur fracture appears incomplete, although there is surrounding soft tissue edema. With continued weight-bearing, this is at risk to become a complete fracture.   Electronically Signed   By: Roxy Horseman M.D.   On: 06/11/2014 14:35   Dg Chest Portable 1 View  07/01/2014   CLINICAL DATA:  Altered mental status.  Status post fall.  EXAM: PORTABLE CHEST - 1 VIEW  COMPARISON:  Chest radiograph performed 06/10/2014  FINDINGS: The lungs are hyperexpanded, with flattening of the hemidiaphragms, compatible with COPD. There  is no evidence of focal opacification, pleural effusion or pneumothorax.  The cardiomediastinal silhouette is mildly enlarged. No acute osseous abnormalities are seen. The patient is status post vertebroplasty at multiple levels along the lower thoracic spine.  IMPRESSION: Findings of COPD. Mild cardiomegaly. Lungs otherwise grossly clear. No displaced rib fractures seen.   Electronically Signed   By: Roanna Raider M.D.   On: 07/01/2014 00:30   Dg Chest Port 1 View  06/10/2014   CLINICAL DATA:  Cough and shortness of breath. Right lower back pain.  EXAM: PORTABLE CHEST - 1 VIEW  COMPARISON:  01/17/2010  FINDINGS: Shallow inspiration. Borderline heart size with normal pulmonary vascularity. Peribronchial thickening and central interstitial changes suggesting chronic bronchitis. No focal airspace disease or consolidation in the lungs. No blunting of costophrenic angles. No pneumothorax. Calcified and tortuous aorta. Degenerative changes in the spine and shoulders. Multiple previous kyphoplasties in the thoracic spine.  IMPRESSION: Chronic bronchitic changes. No evidence of active pulmonary disease.   Electronically Signed   By: Burman Nieves M.D.   On: 06/10/2014 22:13    Scheduled Meds: . carvedilol  3.125 mg Oral BID WC  . cefTRIAXone (ROCEPHIN)  IV  1 g Intravenous Q24H  . cholecalciferol  3,000 Units Oral Daily  . enoxaparin (LOVENOX) injection  30 mg Subcutaneous Q24H  . levothyroxine  50 mcg Oral QAC breakfast  . losartan  50 mg Oral Daily  . polyethylene glycol  17 g Oral Daily  . sodium chloride  3 mL Intravenous Q12H  . traMADol  50 mg Oral Q12H   Continuous Infusions: . sodium chloride 75 mL/hr at 07/01/14 0209    Principal Problem:   Hyponatremia Active Problems:   Hypothyroidism   Osteoporosis   Hypertension   Anemia   Paroxysmal a-fib   Acute encephalopathy   UTI (lower urinary tract infection)    Time spent: 40 minutes   [facility]  [facility] Pager  [phone]. If 7PM-7AM, please contact night-coverage at [email or web address], password [facility] 07/01/2014, 10:32 AM  LOS: 1 day

## 2014-07-01 NOTE — H&P (Signed)
Patient's PCP: Charlette Caffey, MD  Chief Complaint: Altered mental status  History of Present Illness: [name] is a 80 y.o. Caucasian female with history of hypertension, thyroid disease, dementia, vision and hearing loss, iron deficiency anemia, and paroxysmal atrial fibrillation not on anticoagulation due to fall risk who presents with the above complaints.  Patient was recently hospitalized from 06/10/2014 to 06/15/2014 for intertrochanteric fracture of right hip, patient underwent ORIF on 06/13/2014.  She was subsequently discharged to [facility] skilled nursing facility for rehabilitation.  Patient due to her altered mental status cannot provide any history.  Most of the history was obtained from the daughter on the telephone.  It was noted that the patient was becoming increasingly confused at the skilled nursing facility, her sodium was checked which was low as a result she was brought to the emergency department for further evaluation.  In the emergency department, daughter noted that patient had a fall and as a result was placed in a neck brace and had CT of her head and neck.  In the emergency department, she was found to be hypernatremic with a sodium of 124 (i-STAT Chem-8 was 119, less accurate) and urinalysis suggest urinary tract infection.  Hospitalist service was asked to admit the patient for further care and management.  Review of Systems: Could not be obtained due to altered mental status.  Past Medical History  Diagnosis Date  . Thyroid disease   . Hypertension   . Cannot hear   . Vision loss   . Dementia    Past Surgical History  Procedure Laterality Date  . Cesarean section    . Abdominal hysterectomy    . Kyphoplasty    . Intramedullary (im) nail intertrochanteric Right 06/13/2014    Procedure: Affix Nail/Right;  Surgeon: Jodi Marble, MD;  Location: [facility] OR;  Service: Orthopedics;  Laterality: Right;   Family History  Problem Relation Age of Onset  . Breast  cancer Sister   . Cancer Brother   . Liver disease Son   . Lung cancer Son    History   Social History  . Marital Status: Widowed    Spouse Name: N/A    Number of Children: N/A  . Years of Education: N/A   Occupational History  . Not on file.   Social History Main Topics  . Smoking status: Never Smoker   . Smokeless tobacco: Not on file  . Alcohol Use: No  . Drug Use: No  . Sexual Activity: Not on file   Other Topics Concern  . Not on file   Social History Narrative  . No narrative on file   Allergies: Aspirin  Home Meds: Prior to Admission medications   Medication Sig Start Date End Date Taking? Authorizing Provider  acetaminophen-codeine (TYLENOL #3) 300-30 MG per tablet Take 1 tablet by mouth every 6 (six) hours as needed for moderate pain.   Yes Historical Provider, MD  carvedilol (COREG) 3.125 MG tablet Take 1 tablet (3.125 mg total) by mouth 2 (two) times daily with a meal. 06/15/14  Yes Marinda Elk, MD  cholecalciferol (VITAMIN D) 1000 UNITS tablet Take 3,000 Units by mouth daily.   Yes Historical Provider, MD  levothyroxine (SYNTHROID, LEVOTHROID) 50 MCG tablet Take 50 mcg by mouth every morning.    Yes Historical Provider, MD  losartan (COZAAR) 50 MG tablet Take 50 mg by mouth every morning.    Yes Historical Provider, MD  oxyCODONE-acetaminophen (PERCOCET/ROXICET) 5-325 MG per tablet Take 1  tablet by mouth every 4 (four) hours as needed for severe pain. 06/14/14  Yes Jodi Marble, MD  polyethylene glycol [facility] / GLYCOLAX) packet Take 17 g by mouth daily. 06/15/14  Yes Marinda Elk, MD  traMADol (ULTRAM) 50 MG tablet Take 1 tablet (50 mg total) by mouth every 12 (twelve) hours. 06/15/14  Yes Tiffany L Reed, DO  triamterene-hydrochlorothiazide (DYAZIDE) 37.5-25 MG per capsule Take 1 capsule by mouth every morning.    Yes Historical Provider, MD    Physical Exam: Blood pressure 176/98, pulse 85, temperature 98.5 F (36.9 C), temperature source  Axillary, resp. rate 21, SpO2 96.00%. General: Awake, not oriented x3, No acute distress. HEENT: EOMI, Moist mucous membranes Neck: Supple CV: S1 and S2 Lungs: Clear to ascultation bilaterally Abdomen: Soft, Nontender, Nondistended, +bowel sounds. Ext: Good pulses. Trace edema. No clubbing or cyanosis noted. Neuro: Cranial Nerves cannot be assessed due to patient compliance.  Patient moves all of her extremities.  Lab results:  Recent Labs  06/30/14 2220 06/30/14 2229  NA 124* 119*  K 3.9 3.6*  CL 87* 99  CO2 25  --   GLUCOSE 142* 142*  BUN 16 14  CREATININE 0.65 0.70  CALCIUM 9.7  --   MG 1.9  --     Recent Labs  06/30/14 2220  AST 23  ALT 23  ALKPHOS 151*  BILITOT 0.7  PROT 6.6  ALBUMIN 3.5   No results found for this basename: LIPASE, AMYLASE,  in the last 72 hours  Recent Labs  06/30/14 2220 06/30/14 2229  WBC 8.7  --   NEUTROABS 6.7  --   HGB 10.2* 10.9*  HCT 30.8* 32.0*  MCV 69.8*  --   PLT 468*  --    No results found for this basename: CKTOTAL, CKMB, CKMBINDEX, TROPONINI,  in the last 72 hours No components found with this basename: POCBNP,  No results found for this basename: DDIMER,  in the last 72 hours No results found for this basename: HGBA1C,  in the last 72 hours No results found for this basename: CHOL, HDL, LDLCALC, TRIG, CHOLHDL, LDLDIRECT,  in the last 72 hours No results found for this basename: TSH, T4TOTAL, FREET3, T3FREE, THYROIDAB,  in the last 72 hours No results found for this basename: VITAMINB12, FOLATE, FERRITIN, TIBC, IRON, RETICCTPCT,  in the last 72 hours Imaging results:  Ct Abdomen Pelvis Wo Contrast  06/10/2014   CLINICAL DATA:  Right flank pain.  Nausea.  EXAM: CT ABDOMEN AND PELVIS WITHOUT CONTRAST  TECHNIQUE: Multidetector CT imaging of the abdomen and pelvis was performed following the standard protocol without IV contrast.  COMPARISON:  None.  FINDINGS: Mild cardiomegaly. Minimal dependent atelectasis or scarring in  the lung bases. No effusions.  Mild fullness of the renal pelves bilaterally which could reflect extrarenal pelves or Mild chronic UPJ obstructions. Findings are similar to prior study. Large right lower pole renal cyst measures up to 7.5 cm. Multiple bilateral renal cysts present. No ureteral stones. Urinary bladder is unremarkable.  Liver, gallbladder, spleen, pancreas and adrenals have an unremarkable unenhanced appearance. Aorta and branch vessels are heavily calcified, non aneurysmal.  Sigmoid diverticulosis. No active diverticulitis. Moderate stool throughout the colon. Small bowel and stomach are decompressed, grossly unremarkable.  Numerous compression fractures in the thoracolumbar spine. Multi level Prior vertebral augmentation. Findings similar to prior study.  IMPRESSION: Mild fullness of the renal pelves bilaterally with decompressed ureters. This is stable since 2013. This could reflect extrarenal pelves  or mild chronic UPJ obstruction. No ureteral stones.  Bilateral renal cysts.  Sigmoid diverticulosis.  No active diverticulitis.  No acute findings in the abdomen or pelvis.   Electronically Signed   By: Charlett Nose M.D.   On: 06/10/2014 14:51   Dg Hip Operative Right  06/13/2014   CLINICAL DATA:  ORIF right hip  EXAM: DG OPERATIVE RIGHT HIP  TECHNIQUE: A series of 4 intraoperative fluoroscopic spot images of the right hip is submitted.  COMPARISON:  MR 06/11/2014  FINDINGS: IM rod and sliding screw placement across the right femoral neck. Patchy femoral-popliteal arterial calcifications.  IMPRESSION: 1. Internal fixation across right femoral neck.   Electronically Signed   By: Oley Balm M.D.   On: 06/13/2014 18:10   Ct Head Wo Contrast  06/30/2014   CLINICAL DATA:  Hyponatremia.  Dementia, altered mental status.  EXAM: CT HEAD WITHOUT CONTRAST  CT CERVICAL SPINE WITHOUT CONTRAST  TECHNIQUE: Multidetector CT imaging of the head and cervical spine was performed following the standard  protocol without intravenous contrast. Multiplanar CT image reconstructions of the cervical spine were also generated.  COMPARISON:  None available for comparison at time of study interpretation.  FINDINGS: CT HEAD FINDINGS  Moderately motion degraded examination. Part technologist note, patient was unable to remain still during the examination.  The ventricles and sulci are normal for age. No intraparenchymal hemorrhage, mass effect nor midline shift. Confluent supratentorial white matter hypodensities are non-specific suggest sequelae of chronic small vessel ischemic disease. Tiny hypodensities within the bilateral basal ganglia and thalamus. No acute large vascular territory infarcts.  No abnormal extra-axial fluid collections. Basal cisterns are patent. Moderate calcific atherosclerosis of the carotid siphons.  No skull fracture. The included ocular globes and orbital contents are non-suspicious. The mastoid aircells and included paranasal sinuses are well-aerated.  CT CERVICAL SPINE FINDINGS  Moderately motion degraded examination. Due to motion, there is a spurious duplicated appearance of the anterior arch of C1.  Mild C5 vertebral body height loss . The remaining cervical vertebra appear grossly intact. Maintenance of cervical lordosis. Moderate to severe multilevel disc degeneration. Bone mineral density is decreased without destructive bony lesions. Lateral masses and alignment. Included prevertebral and paraspinal soft tissues are nonsuspicious ; fibronodular scarring RIGHT lung apex.  IMPRESSION: CT head: Moderately motion degraded examination without convincing evidence of acute intracranial process. If clinical suspicion persists, consider repeat examination when patient is better able to the tolerate imaging.  Involutional changes. Moderate to severe white matter changes suggest chronic small vessel ischemic disease. Basal ganglia and thalamus lacunar infarcts.  CT cervical spine: Moderately motion  degraded examination. Mild C5 vertebral body height loss could be artifact or congenital variant, less likely acute injury. No malalignment. If clinical suspicion persists, consider repeat examination when patient is better able to the tolerate imaging.   Electronically Signed   By: Awilda Metro   On: 06/30/2014 23:32   Ct Cervical Spine Wo Contrast  06/30/2014   CLINICAL DATA:  Hyponatremia.  Dementia, altered mental status.  EXAM: CT HEAD WITHOUT CONTRAST  CT CERVICAL SPINE WITHOUT CONTRAST  TECHNIQUE: Multidetector CT imaging of the head and cervical spine was performed following the standard protocol without intravenous contrast. Multiplanar CT image reconstructions of the cervical spine were also generated.  COMPARISON:  None available for comparison at time of study interpretation.  FINDINGS: CT HEAD FINDINGS  Moderately motion degraded examination. Part technologist note, patient was unable to remain still during the examination.  The ventricles and  sulci are normal for age. No intraparenchymal hemorrhage, mass effect nor midline shift. Confluent supratentorial white matter hypodensities are non-specific suggest sequelae of chronic small vessel ischemic disease. Tiny hypodensities within the bilateral basal ganglia and thalamus. No acute large vascular territory infarcts.  No abnormal extra-axial fluid collections. Basal cisterns are patent. Moderate calcific atherosclerosis of the carotid siphons.  No skull fracture. The included ocular globes and orbital contents are non-suspicious. The mastoid aircells and included paranasal sinuses are well-aerated.  CT CERVICAL SPINE FINDINGS  Moderately motion degraded examination. Due to motion, there is a spurious duplicated appearance of the anterior arch of C1.  Mild C5 vertebral body height loss . The remaining cervical vertebra appear grossly intact. Maintenance of cervical lordosis. Moderate to severe multilevel disc degeneration. Bone mineral density is  decreased without destructive bony lesions. Lateral masses and alignment. Included prevertebral and paraspinal soft tissues are nonsuspicious ; fibronodular scarring RIGHT lung apex.  IMPRESSION: CT head: Moderately motion degraded examination without convincing evidence of acute intracranial process. If clinical suspicion persists, consider repeat examination when patient is better able to the tolerate imaging.  Involutional changes. Moderate to severe white matter changes suggest chronic small vessel ischemic disease. Basal ganglia and thalamus lacunar infarcts.  CT cervical spine: Moderately motion degraded examination. Mild C5 vertebral body height loss could be artifact or congenital variant, less likely acute injury. No malalignment. If clinical suspicion persists, consider repeat examination when patient is better able to the tolerate imaging.   Electronically Signed   By: Awilda Metro   On: 06/30/2014 23:32   Mr Lumbar Spine Wo Contrast  06/10/2014   CLINICAL DATA:  80 year old with low back pain radiating into the left leg. History of vertebroplasty. Urinary incontinence.  EXAM: MRI LUMBAR SPINE WITHOUT CONTRAST  TECHNIQUE: Multiplanar, multisequence MR imaging of the lumbar spine was performed. No intravenous contrast was administered.  COMPARISON:  MRI 01/16/2012.  Abdominal pelvic CT 06/10/2014.  FINDINGS: The lumbar alignment is stable and near anatomic. Multiple chronic thoracolumbar compression deformities are again noted. The patient has undergone previous spinal augmentation at T11, T12 and L1. Fractures at those levels have healed. There are chronic superior endplate compression deformities at L2 and L3 and inferior endplate compression deformities at L4 and L5. There is no evidence of acute fracture.  The conus medullaris extends to the L1-2 level and appears normal. No paraspinal abnormalities are identified.Multiple renal cysts are present bilaterally.  L1-2: Mild disc bulging. No  significant spinal stenosis or nerve root encroachment.  L2-3: Annular disc bulging eccentric to the left with mild facet and ligamentous hypertrophy. There is mild narrowing of the lateral recesses. The foramina are patent.  L3-4: Mild disc bulging, facet and ligamentous hypertrophy. No significant spinal stenosis or nerve root encroachment.  L4-5: Disc bulging and osteophytes are asymmetric to the right. There is mild facet and ligamentous hypertrophy. There is mild narrowing of the lateral recesses. The foramina are patent.  L5-S1: There are moderate facet degenerative changes with mild disc bulging eccentric to the right. There is mild inferior foraminal narrowing on the right without definite nerve root encroachment.  IMPRESSION: 1. Multiple healed thoracolumbar compression deformities status post spinal augmentation. No acute osseous findings. 2. Generally stable multilevel spondylosis. No high-grade spinal stenosis or nerve root encroachment demonstrated.   Electronically Signed   By: Roxy Horseman M.D.   On: 06/10/2014 17:21   Dg Pelvis Portable  06/30/2014   CLINICAL DATA:  Fall. Pelvic pain. Initial encounter. Recent ORIF  of a right femoral neck fracture.  EXAM: PORTABLE PELVIS 1-2 VIEWS  COMPARISON:  MRI pelvis 06/11/2014.  CT pelvis of that same date.  FINDINGS: ORIF of the previously identified right femoral neck fracture with anatomic alignment. Severe osseous demineralization. No visible acute fractures. Hip joints intact with symmetric moderate to severe inferolateral joint space narrowing. Sacroiliac joints and symphysis pubis intact.  IMPRESSION: No acute osseous abnormality.   Electronically Signed   By: Hulan Saas M.D.   On: 06/30/2014 22:53   Ct Hip Right Wo Contrast  06/11/2014   CLINICAL DATA:  Hip pain.  EXAM: CT OF THE RIGHT HIP WITHOUT CONTRAST  TECHNIQUE: Multidetector CT imaging was performed according to the standard protocol. Multiplanar CT image reconstructions were also  generated.  COMPARISON:  CT 06/10/2014  FINDINGS: Diffuse osteopenia and degenerative change present. An extremely subtle lucency is noted along the medial aspect of the base of the right femoral neck . Punctate area air is noted within the medullary bone adjacent to this region. These changes may be related to an extremely subtle fracture of the base of the right femoral neck. No displaced fractures noted. Sacrum and SI joints are intact were visualized. Sigmoid colonic diverticulosis. Right inguinal hernia with herniation of fat only.Peripheral vascular disease.  IMPRESSION: 1. Extremely subtle lucency is noted along the medial aspect of the base of the right femoral neck. Adjacent punctate area of air is noted in the medullary bone. These changes may be related to an extremely subtle fracture of the base of the right femoral neck. If further evaluation is needed MRI can be obtained. 2. Diffuse severe osteopenia and degenerative change.   Electronically Signed   By: Maisie Fus  Register   On: 06/11/2014 11:19   Mr Hip Right Wo Contrast  06/11/2014   CLINICAL DATA:  Persistent right hip pain. No reported acute injury or history of malignancy.  EXAM: MR OF THE RIGHT HIP WITHOUT CONTRAST  TECHNIQUE: Multiplanar, multisequence MR imaging was performed. No intravenous contrast was administered.  COMPARISON:  Right hip CT 06/11/2014, lumbar MRI 06/10/2014, pelvic CT 06/10/2014 and whole body bone scan 12/23/2011.  FINDINGS: Bones: There is a fracture of the proximal right femur extending laterally from the lesser trochanter, corresponding with the recent CT finding. This fracture extends into the intertrochanteric region, but appears incomplete. There is prominent surrounding bone marrow and soft tissue edema. There is a contralateral fracture of the left femoral neck medially with marrow edema, also appearing incomplete. In addition, there is a fracture of the left acetabular roof with a linear component best seen on  the coronal images. The sacroiliac joints and symphysis pubis appear normal.  Articular cartilage and labrum  Articular cartilage: There are mild degenerative changes at both hips. There is no evidence of femoral head avascular necrosis.  Labrum: There is acetabular labral degeneration without paralabral cyst formation.  Joint or bursal effusion  Joint effusion: Small bilateral hip joint effusions.  Bursae: There is no focal periarticular fluid collection. However, there is moderate edema surrounding the proximal right femur fracture.  Muscles and tendons  Muscles and tendons: The gluteus, hamstring and iliopsoas tendons appear normal. The piriformis muscles appear symmetric.  Other findings  Miscellaneous: Bilateral renal cysts are partially imaged. There are chronic compression deformities within the lower lumbar spine. There is prominent fat within both inguinal canals which appears stable.  IMPRESSION: 1. There are multiple fractures involving the medial intertrochanteric region of the right femur, the left femoral neck medially  and the left superior acetabulum. Given the presence of numerous thoracolumbar compression deformities on prior imaging, these are likely insufficiency fractures. No definite pathologic features or other suspicious marrow lesions identified. 2. The right femur fracture appears incomplete, although there is surrounding soft tissue edema. With continued weight-bearing, this is at risk to become a complete fracture.   Electronically Signed   By: Roxy Horseman M.D.   On: 06/11/2014 14:35   Dg Chest Portable 1 View  07/01/2014   CLINICAL DATA:  Altered mental status.  Status post fall.  EXAM: PORTABLE CHEST - 1 VIEW  COMPARISON:  Chest radiograph performed 06/10/2014  FINDINGS: The lungs are hyperexpanded, with flattening of the hemidiaphragms, compatible with COPD. There is no evidence of focal opacification, pleural effusion or pneumothorax.  The cardiomediastinal silhouette is mildly  enlarged. No acute osseous abnormalities are seen. The patient is status post vertebroplasty at multiple levels along the lower thoracic spine.  IMPRESSION: Findings of COPD. Mild cardiomegaly. Lungs otherwise grossly clear. No displaced rib fractures seen.   Electronically Signed   By: Roanna Raider M.D.   On: 07/01/2014 00:30   Dg Chest Port 1 View  06/10/2014   CLINICAL DATA:  Cough and shortness of breath. Right lower back pain.  EXAM: PORTABLE CHEST - 1 VIEW  COMPARISON:  01/17/2010  FINDINGS: Shallow inspiration. Borderline heart size with normal pulmonary vascularity. Peribronchial thickening and central interstitial changes suggesting chronic bronchitis. No focal airspace disease or consolidation in the lungs. No blunting of costophrenic angles. No pneumothorax. Calcified and tortuous aorta. Degenerative changes in the spine and shoulders. Multiple previous kyphoplasties in the thoracic spine.  IMPRESSION: Chronic bronchitic changes. No evidence of active pulmonary disease.   Electronically Signed   By: Burman Nieves M.D.   On: 06/10/2014 22:13   Other results: EKG: Sinus tachycardia with PACs.  Assessment & Plan by Problem: Acute encephalopathy complicated by dementia Likely due to hyponatremia and urinary tract infection.  Continue to monitor.  Consider a bedside due to fall risk.  Hyponatremia Unclear etiology, suspect is likely due to triamterene/hydrochlorothiazide, which has been discontinued.  Continue losartan.  Send urine sodium, creatinine, and urine osmolarity.  Gently hydrate the patient on IV fluids, if sodium does not improve with hydration and discontinuing triamterene/hydrochlorothiazide consider further workup.  Urinary tract infection Started the patient on ceftriaxone.  Further titration of antibiotics depending on culture results and sensitivities.  Fall Request PT and OT evaluation.  Head CT and C-spine CT have moderate motion degraded exam, no obvious signs of  fractures.  Hypothyroidism Continue levothyroxine.  Hypertension Continue carvedilol and losartan.  Discontinue triamterene/hydrochlorothiazide.  Stable.  Paroxysmal atrial fibrillation Rate controlled.  Currently in sinus rhythm.  Not anticoagulated due to fall risk.  Osteoporosis Stable.  Anemia Likely due to chronic disease.  Hemoglobin stable.  Continue to monitor.  Recent history of right hip ORIF Continue PT/OT.  Prophylaxis Lovenox.  CODE STATUS DO NOT RESUSCITATE/DO NOT INTUBATE.  Patient has gold out of facility DO NOT RESUSCITATE form.  Also confirmed with daughter on the telephone.  Family communication Daughter updated on the telephone.  Disposition Admit the patient to telemetry as inpatient.  Time spent on admission, talking to the patient, and coordinating care was: 50 mins.  Maximus Hoffert A, MD 07/01/2014, 1:05 AM

## 2014-07-02 ENCOUNTER — Inpatient Hospital Stay (HOSPITAL_COMMUNITY): Payer: Medicare Other

## 2014-07-02 DIAGNOSIS — F03918 Unspecified dementia, unspecified severity, with other behavioral disturbance: Secondary | ICD-10-CM

## 2014-07-02 DIAGNOSIS — F0391 Unspecified dementia with behavioral disturbance: Secondary | ICD-10-CM

## 2014-07-02 DIAGNOSIS — N390 Urinary tract infection, site not specified: Secondary | ICD-10-CM

## 2014-07-02 DIAGNOSIS — B9620 Unspecified Escherichia coli [E. coli] as the cause of diseases classified elsewhere: Secondary | ICD-10-CM | POA: Diagnosis present

## 2014-07-02 LAB — COMPREHENSIVE METABOLIC PANEL
ALT: 19 U/L (ref 0–35)
AST: 22 U/L (ref 0–37)
Albumin: 2.9 g/dL — ABNORMAL LOW (ref 3.5–5.2)
Alkaline Phosphatase: 119 U/L — ABNORMAL HIGH (ref 39–117)
Anion gap: 13 (ref 5–15)
BILIRUBIN TOTAL: 0.7 mg/dL (ref 0.3–1.2)
BUN: 18 mg/dL (ref 6–23)
CALCIUM: 8.8 mg/dL (ref 8.4–10.5)
CHLORIDE: 94 meq/L — AB (ref 96–112)
CO2: 25 mEq/L (ref 19–32)
Creatinine, Ser: 0.56 mg/dL (ref 0.50–1.10)
GFR calc Af Amer: >90 mL/min (ref >90)
GFR calc non Af Amer: 78 mL/min — ABNORMAL LOW (ref >90)
Glucose, Bld: 140 mg/dL — ABNORMAL HIGH (ref 70–99)
Potassium: 3.2 mEq/L — ABNORMAL LOW (ref 3.7–5.3)
Sodium: 132 mEq/L — ABNORMAL LOW (ref 137–147)
Total Protein: 5.6 g/dL — ABNORMAL LOW (ref 6.0–8.3)

## 2014-07-02 LAB — CBC
HEMATOCRIT: 23.8 % — AB (ref 36.0–46.0)
HEMOGLOBIN: 7.9 g/dL — AB (ref 12.0–15.0)
MCH: 23.4 pg — ABNORMAL LOW (ref 26.0–34.0)
MCHC: 33.2 g/dL (ref 30.0–36.0)
MCV: 70.4 fL — AB (ref 78.0–100.0)
Platelets: 365 10*3/uL (ref 150–400)
RBC: 3.38 MIL/uL — AB (ref 3.87–5.11)
RDW: 25.2 % — ABNORMAL HIGH (ref 11.5–15.5)
WBC: 12.1 10*3/uL — AB (ref 4.0–10.5)

## 2014-07-02 LAB — MAGNESIUM: MAGNESIUM: 1.8 mg/dL (ref 1.5–2.5)

## 2014-07-02 MED ORDER — HALOPERIDOL LACTATE 5 MG/ML IJ SOLN
0.5000 mg | Freq: Four times a day (QID) | INTRAMUSCULAR | Status: DC | PRN
  Administered 2014-07-02 – 2014-07-03 (×2): 0.5 mg via INTRAVENOUS
  Filled 2014-07-02 (×2): qty 1

## 2014-07-02 MED ORDER — BISACODYL 10 MG RE SUPP
10.0000 mg | Freq: Once | RECTAL | Status: AC
  Administered 2014-07-02: 10 mg via RECTAL
  Filled 2014-07-02: qty 1

## 2014-07-02 MED ORDER — POTASSIUM CHLORIDE CRYS ER 20 MEQ PO TBCR
40.0000 meq | EXTENDED_RELEASE_TABLET | Freq: Once | ORAL | Status: AC
  Administered 2014-07-02: 40 meq via ORAL
  Filled 2014-07-02: qty 2

## 2014-07-02 MED ORDER — QUETIAPINE FUMARATE 25 MG PO TABS
25.0000 mg | ORAL_TABLET | Freq: Every day | ORAL | Status: DC
  Administered 2014-07-02 – 2014-07-03 (×2): 25 mg via ORAL
  Filled 2014-07-02 (×3): qty 1

## 2014-07-02 NOTE — Progress Notes (Addendum)
[facility] PROGRESS NOTE  ICESS BERTONI ZOX:[id number] DOB: [birth year] DOA: 06/30/2014 PCP: Charlette Caffey, MD   Brief narrative 80 y.o. Caucasian female with history of hypertension, thyroid disease, dementia, vision and hearing loss, iron deficiency anemia, and paroxysmal atrial fibrillation not on anticoagulation due to fall risk who presented from [facility] with acute encephaloapthy. Patient was recently hospitalized from 06/10/2014 to 06/15/2014 for intertrochanteric fracture of right hip, underwent ORIF on 06/13/2014. She was subsequently discharged to [facility] skilled nursing facility for rehabilitation. It was noted that the patient was becoming increasingly confused at the skilled nursing facility, her sodium was checked which was low as a result she was brought to the emergency department for further evaluation. In the emergency department, daughter noted that patient had a fall and as a result was placed in a neck brace and had CT of her head and neck. In the emergency department, she was found to be hyponatremic with a sodium of 124 and urinalysis suggest urinary tract infection.   Assessment/Plan: Acute encephalopathy complicated by dementia UTI and hyponatremia   Continue to monitor. Continue bedside sitter due to fall risk.  Patient confused overnight pulling out IV. Will place on low dose seroquel and prn low dose haldol. Will stop tramdol ( she is on scheduled tramadol and daughter says it makes her crazy). Tylenol 3 prn as needed.  Hyponatremia  suspect likely due to triamterene/hydrochlorothiazide, which has been discontinued.   -Improving with IV NS. Na 132 today. Continue  fluid restriction.  Recent TSH was normal   Urinary tract infection  Started the patient on empiric ceftriaxone. cx growing ecoli. Sensitivity pending  Fall  - PT and OT evaluation. Head CT and C-spine CT have moderate motion degraded exam, no obvious signs of fractures.   abdominal  distention Patient tender on exam. check abd xray to  r/o ileus. Will order stool softener/ enema if shows stool burden  Hypokalemia Replenish  with kcl  Hypothyroidism  Continue levothyroxine. Recent TSH normal   Hypertension  Continue carvedilol and losartan. Discontinue triamterene/hydrochlorothiazide.   Paroxysmal atrial fibrillation  Rate controlled. Currently in sinus rhythm. Not on anticoagulation due to fall risk.   Osteoporosis  Stable.   Anemia  Microcytic anemia. Baseline hemoglobin around 9.0, Hemoglobin dropped this am to 7.9. No need for transfusion at this time.  Recheck in am   Recent history of right hip ORIF  Continue PT/OT.   DVT Prophylaxis  Lovenox.   DIET: heart healthy     Code Status: DNR Family Communication: daughter updated on the phone Disposition Plan: return to SNF once stable   Consultants:  none  Procedures:  none  Antibiotics:  IV rocephin  HPI/Subjective: Pt agitated overnight and confused. pulled her IV out. Placed on mittens  Objective: Filed Vitals:   07/02/14 0439  BP: 151/64  Pulse: 84  Temp: 98 F (36.7 C)  Resp: 20    Intake/Output Summary (Last 24 hours) at 07/02/14 0948 Last data filed at 07/02/14 0651  Gross per 24 hour  Intake    950 ml  Output     50 ml  Net    900 ml   Filed Weights   07/01/14 0200 07/02/14 0439  Weight: 48.716 kg (107 lb 6.4 oz) 47.945 kg (105 lb 11.2 oz)    Exam:   General:  Elderly female lying in bed, shouting, confused  Cardiovascular: NS1 &S2, no murmurs  Respiratory: clear b/l  Abdomen: distended, tender to pressure, BS+  Musculoskeletal: warm, no edema, dressing over right hip  CNS: alert but very confused  Data Reviewed: Basic Metabolic Panel:  Recent Labs Lab 06/30/14 2220 06/30/14 2229 07/01/14 0510 07/01/14 1642 07/02/14 0350  NA 124* 119* 125* 129* 132*  K 3.9 3.6* 3.8 3.3* 3.2*  CL 87* 99 90* 91* 94*  CO2 25  --  GLUCOSE 142*  142* 138* 130* 140*  BUN CREATININE 0.65 0.70 0.59 0.60 0.56  CALCIUM 9.7  --  8.7 8.7 8.8  MG 1.9  --   --   --  1.8   Liver Function Tests:  Recent Labs Lab 06/30/14 2220 07/02/14 0350  AST 23 22  ALT 23 19  ALKPHOS 151* 119*  BILITOT 0.7 0.7  PROT 6.6 5.6*  ALBUMIN 3.5 2.9*   No results found for this basename: LIPASE, AMYLASE,  in the last 168 hours No results found for this basename: AMMONIA,  in the last 168 hours CBC:  Recent Labs Lab 06/30/14 2220 06/30/14 2229 07/01/14 0755 07/02/14 0350  WBC 8.7  --  12.3* 12.1*  NEUTROABS 6.7  --   --   --   HGB 10.2* 10.9* 8.9* 7.9*  HCT 30.8* 32.0* 26.8* 23.8*  MCV 69.8*  --  70.0* 70.4*  PLT 468*  --  406* 365   Cardiac Enzymes: No results found for this basename: CKTOTAL, CKMB, CKMBINDEX, TROPONINI,  in the last 168 hours BNP (last 3 results) No results found for this basename: PROBNP,  in the last 8760 hours CBG: No results found for this basename: GLUCAP,  in the last 168 hours  Recent Results (from the past 240 hour(s))  MRSA PCR SCREENING     Status: None   Collection Time    07/01/14  2:36 AM      Result Value Ref Range Status   MRSA by PCR NEGATIVE  NEGATIVE Final   Comment:            The GeneXpert MRSA Assay (FDA     approved for NASAL specimens     only), is one component of a     comprehensive MRSA colonization     surveillance program. It is not     intended to diagnose MRSA     infection nor to guide or     monitor treatment for     MRSA infections.     Studies: Ct Head Wo Contrast  06/30/2014   CLINICAL DATA:  Hyponatremia.  Dementia, altered mental status.  EXAM: CT HEAD WITHOUT CONTRAST  CT CERVICAL SPINE WITHOUT CONTRAST  TECHNIQUE: Multidetector CT imaging of the head and cervical spine was performed following the standard protocol without intravenous contrast. Multiplanar CT image reconstructions of the cervical spine were also generated.  COMPARISON:  None available for  comparison at time of study interpretation.  FINDINGS: CT HEAD FINDINGS  Moderately motion degraded examination. Part technologist note, patient was unable to remain still during the examination.  The ventricles and sulci are normal for age. No intraparenchymal hemorrhage, mass effect nor midline shift. Confluent supratentorial white matter hypodensities are non-specific suggest sequelae of chronic small vessel ischemic disease. Tiny hypodensities within the bilateral basal ganglia and thalamus. No acute large vascular territory infarcts.  No abnormal extra-axial fluid collections. Basal cisterns are patent. Moderate calcific atherosclerosis of the carotid siphons.  No skull fracture. The included ocular globes and orbital contents are non-suspicious. The mastoid aircells and included paranasal  sinuses are well-aerated.  CT CERVICAL SPINE FINDINGS  Moderately motion degraded examination. Due to motion, there is a spurious duplicated appearance of the anterior arch of C1.  Mild C5 vertebral body height loss . The remaining cervical vertebra appear grossly intact. Maintenance of cervical lordosis. Moderate to severe multilevel disc degeneration. Bone mineral density is decreased without destructive bony lesions. Lateral masses and alignment. Included prevertebral and paraspinal soft tissues are nonsuspicious ; fibronodular scarring RIGHT lung apex.  IMPRESSION: CT head: Moderately motion degraded examination without convincing evidence of acute intracranial process. If clinical suspicion persists, consider repeat examination when patient is better able to the tolerate imaging.  Involutional changes. Moderate to severe white matter changes suggest chronic small vessel ischemic disease. Basal ganglia and thalamus lacunar infarcts.  CT cervical spine: Moderately motion degraded examination. Mild C5 vertebral body height loss could be artifact or congenital variant, less likely acute injury. No malalignment. If clinical  suspicion persists, consider repeat examination when patient is better able to the tolerate imaging.   Electronically Signed   By: Awilda Metro   On: 06/30/2014 23:32   Ct Cervical Spine Wo Contrast  06/30/2014   CLINICAL DATA:  Hyponatremia.  Dementia, altered mental status.  EXAM: CT HEAD WITHOUT CONTRAST  CT CERVICAL SPINE WITHOUT CONTRAST  TECHNIQUE: Multidetector CT imaging of the head and cervical spine was performed following the standard protocol without intravenous contrast. Multiplanar CT image reconstructions of the cervical spine were also generated.  COMPARISON:  None available for comparison at time of study interpretation.  FINDINGS: CT HEAD FINDINGS  Moderately motion degraded examination. Part technologist note, patient was unable to remain still during the examination.  The ventricles and sulci are normal for age. No intraparenchymal hemorrhage, mass effect nor midline shift. Confluent supratentorial white matter hypodensities are non-specific suggest sequelae of chronic small vessel ischemic disease. Tiny hypodensities within the bilateral basal ganglia and thalamus. No acute large vascular territory infarcts.  No abnormal extra-axial fluid collections. Basal cisterns are patent. Moderate calcific atherosclerosis of the carotid siphons.  No skull fracture. The included ocular globes and orbital contents are non-suspicious. The mastoid aircells and included paranasal sinuses are well-aerated.  CT CERVICAL SPINE FINDINGS  Moderately motion degraded examination. Due to motion, there is a spurious duplicated appearance of the anterior arch of C1.  Mild C5 vertebral body height loss . The remaining cervical vertebra appear grossly intact. Maintenance of cervical lordosis. Moderate to severe multilevel disc degeneration. Bone mineral density is decreased without destructive bony lesions. Lateral masses and alignment. Included prevertebral and paraspinal soft tissues are nonsuspicious ;  fibronodular scarring RIGHT lung apex.  IMPRESSION: CT head: Moderately motion degraded examination without convincing evidence of acute intracranial process. If clinical suspicion persists, consider repeat examination when patient is better able to the tolerate imaging.  Involutional changes. Moderate to severe white matter changes suggest chronic small vessel ischemic disease. Basal ganglia and thalamus lacunar infarcts.  CT cervical spine: Moderately motion degraded examination. Mild C5 vertebral body height loss could be artifact or congenital variant, less likely acute injury. No malalignment. If clinical suspicion persists, consider repeat examination when patient is better able to the tolerate imaging.   Electronically Signed   By: Awilda Metro   On: 06/30/2014 23:32   Dg Pelvis Portable  06/30/2014   CLINICAL DATA:  Fall. Pelvic pain. Initial encounter. Recent ORIF of a right femoral neck fracture.  EXAM: PORTABLE PELVIS 1-2 VIEWS  COMPARISON:  MRI pelvis 06/11/2014.  CT pelvis  of that same date.  FINDINGS: ORIF of the previously identified right femoral neck fracture with anatomic alignment. Severe osseous demineralization. No visible acute fractures. Hip joints intact with symmetric moderate to severe inferolateral joint space narrowing. Sacroiliac joints and symphysis pubis intact.  IMPRESSION: No acute osseous abnormality.   Electronically Signed   By: Hulan Saas M.D.   On: 06/30/2014 22:53   Dg Chest Portable 1 View  07/01/2014   CLINICAL DATA:  Altered mental status.  Status post fall.  EXAM: PORTABLE CHEST - 1 VIEW  COMPARISON:  Chest radiograph performed 06/10/2014  FINDINGS: The lungs are hyperexpanded, with flattening of the hemidiaphragms, compatible with COPD. There is no evidence of focal opacification, pleural effusion or pneumothorax.  The cardiomediastinal silhouette is mildly enlarged. No acute osseous abnormalities are seen. The patient is status post vertebroplasty at  multiple levels along the lower thoracic spine.  IMPRESSION: Findings of COPD. Mild cardiomegaly. Lungs otherwise grossly clear. No displaced rib fractures seen.   Electronically Signed   By: Roanna Raider M.D.   On: 07/01/2014 00:30    Scheduled Meds: . carvedilol  3.125 mg Oral BID WC  . cefTRIAXone (ROCEPHIN)  IV  1 g Intravenous Q24H  . cholecalciferol  3,000 Units Oral Daily  . enoxaparin (LOVENOX) injection  30 mg Subcutaneous Q24H  . levothyroxine  50 mcg Oral QAC breakfast  . losartan  50 mg Oral Daily  . polyethylene glycol  17 g Oral Daily  . potassium chloride  40 mEq Oral Once  . QUEtiapine  25 mg Oral QHS  . sodium chloride  3 mL Intravenous Q12H  . traMADol  50 mg Oral Q12H   Continuous Infusions:     Time spent: 25 minutes    Babe Clenney  [facility] Pager [phone] If 7PM-7AM, please contact night-coverage at [email or web address], password [facility] 07/02/2014, 9:48 AM  LOS: 2 days

## 2014-07-03 LAB — BASIC METABOLIC PANEL
Anion gap: 16 — ABNORMAL HIGH (ref 5–15)
BUN: 18 mg/dL (ref 6–23)
CHLORIDE: 96 meq/L (ref 96–112)
CO2: 23 meq/L (ref 19–32)
Calcium: 9.3 mg/dL (ref 8.4–10.5)
Creatinine, Ser: 0.67 mg/dL (ref 0.50–1.10)
GFR calc Af Amer: 85 mL/min — ABNORMAL LOW (ref >90)
GFR calc non Af Amer: 73 mL/min — ABNORMAL LOW (ref >90)
Glucose, Bld: 114 mg/dL — ABNORMAL HIGH (ref 70–99)
POTASSIUM: 3.3 meq/L — AB (ref 3.7–5.3)
Sodium: 135 mEq/L — ABNORMAL LOW (ref 137–147)

## 2014-07-03 LAB — CBC
HCT: 25.5 % — ABNORMAL LOW (ref 36.0–46.0)
HEMOGLOBIN: 8.3 g/dL — AB (ref 12.0–15.0)
MCH: 22.9 pg — AB (ref 26.0–34.0)
MCHC: 32.5 g/dL (ref 30.0–36.0)
MCV: 70.4 fL — ABNORMAL LOW (ref 78.0–100.0)
Platelets: 405 10*3/uL — ABNORMAL HIGH (ref 150–400)
RBC: 3.62 MIL/uL — AB (ref 3.87–5.11)
RDW: 25.4 % — ABNORMAL HIGH (ref 11.5–15.5)
WBC: 11.6 10*3/uL — ABNORMAL HIGH (ref 4.0–10.5)

## 2014-07-03 MED ORDER — POTASSIUM CHLORIDE CRYS ER 20 MEQ PO TBCR
40.0000 meq | EXTENDED_RELEASE_TABLET | Freq: Two times a day (BID) | ORAL | Status: DC
  Administered 2014-07-03 – 2014-07-04 (×3): 40 meq via ORAL
  Filled 2014-07-03 (×4): qty 2

## 2014-07-03 MED ORDER — FERROUS SULFATE 325 (65 FE) MG PO TABS
325.0000 mg | ORAL_TABLET | Freq: Two times a day (BID) | ORAL | Status: DC
  Administered 2014-07-03 – 2014-07-04 (×2): 325 mg via ORAL
  Filled 2014-07-03 (×4): qty 1

## 2014-07-03 NOTE — Progress Notes (Signed)
Daughter at bedside only for a few minutes because pt does not remember who she is and daughter says it is "useless" to stay with pt.  Daughter states pt is very far from her baseline.

## 2014-07-03 NOTE — Care Management Note (Unsigned)
    Page 1 of 1   07/03/2014     4:42:32 PM CARE MANAGEMENT NOTE 07/03/2014  Patient:  [name], [name]   Account Number:  [id number]  Date Initiated:  07/03/2014  Documentation initiated by:  GRAVES-BIGELOW,Haley Roza  Subjective/Objective Assessment:   Pt admitted from [facility] with acute encephaloapthy.     Action/Plan:   CSW will assit with disposition needs.   Anticipated DC Date:  07/05/2014   Anticipated DC Plan:  SKILLED NURSING FACILITY  In-house referral  Clinical Social Worker      DC Planning Services  CM consult      Choice offered to / List presented to:             Status of service:  In process, will continue to follow Medicare Important Message given?  YES (If response is "NO", the following Medicare IM given date fields will be blank) Date Medicare IM given:  07/03/2014 Medicare IM given by:  GRAVES-BIGELOW,Journe Hallmark Date Additional Medicare IM given:   Additional Medicare IM given by:    Discharge Disposition:  SKILLED NURSING FACILITY  Per UR Regulation:  Reviewed for med. necessity/level of care/duration of stay  If discussed at Long Length of Stay Meetings, dates discussed:    Comments:

## 2014-07-03 NOTE — Progress Notes (Signed)
[facility] PROGRESS NOTE  [name] ZOX:[id number] DOB: 13-Jun-1919 DOA: 06/30/2014 PCP: Charlette Caffey, MD  Assessment/Plan: 80 y/o female with PMH of HTN, Hypothyroidism, PAF not obn AC (fall, bleeding risk), IDA, Dementia, vision and hearing loss, who presented from [facility] with acute encephaloapthy.  -Patient was recently hospitalized from 06/10/2014 to 06/15/2014 for intertrochanteric fracture of right hip, underwent ORIF on 06/13/2014.  -In the emergency department, daughter noted that patient had a fall and as a result was placed in a neck brace and had CT of her head and neck. In the emergency department, she was found to be hyponatremic with a sodium of 124 and urinalysis suggest urinary tract infection.   1. Acute encephalopathy likely due to UTI, on top of dementia UTI -CT: no acute findings, but old CVA; Continue bedside sitter due to fall risk. Rx UTI;  -stop tramdol ( she is on scheduled tramadol and daughter says it makes her crazy).   2. Hyponatremia likely due to triamterene/hydrochlorothiazide -hold diuretics; gentle IVF  -Hypo K; replace; recheck in AM  3. Urinary tract infection started the patient on empiric ceftriaxone. cx growing ecoli.  4. Fall PT and OT evaluation. Head CT and C-spine CT have moderate motion degraded exam, no obvious signs of fractures.  5. Abdominal distention, resolving;  exam unremarkable; abd xray:No acute abnormality -cont supportive care;  6. Hypertension  -Continue carvedilol and losartan. Discontinue triamterene/hydrochlorothiazide.  7. Paroxysmal atrial fibrillation  -Rate controlled. Currently in sinus rhythm. Not on anticoagulation due to fall risk.  8. Anemia, IDA; no s/s of acute bleeding; start PO iron 9. Recent history of right hip ORIF Continue PT/OT.    DVT Prophylaxis  Lovenox.    Code Status: full Family Communication: d/w patient, called [name] Daughter [phone] no answer;  try later  (indicate person spoken with, relationship, and if by phone, the number) Disposition Plan: SNF 24-48 hrs    Consultants:  none  Procedures:  none  Antibiotics:  Ceftriaxone  (indicate start date, and stop date if known)  HPI/Subjective: Alert, confused   Objective: Filed Vitals:   07/03/14 0632  BP: 125/58  Pulse: 91  Temp: 97.7 F (36.5 C)  Resp: 18    Intake/Output Summary (Last 24 hours) at 07/03/14 1211 Last data filed at 07/03/14 0600  Gross per 24 hour  Intake    120 ml  Output      0 ml  Net    120 ml   Filed Weights   07/01/14 0200 07/02/14 0439 07/03/14 0632  Weight: 48.716 kg (107 lb 6.4 oz) 47.945 kg (105 lb 11.2 oz) 47.673 kg (105 lb 1.6 oz)    Exam:   General:  Alert, confused  Cardiovascular: s1,s2 rrr  Respiratory: CTA BL  Abdomen: soft, nt,nd   Musculoskeletal: no LE edema   Data Reviewed: Basic Metabolic Panel:  Recent Labs Lab 06/30/14 2220 06/30/14 2229 07/01/14 0510 07/01/14 1642 07/02/14 0350 07/03/14 0955  NA 124* 119* 125* 129* 132* 135*  K 3.9 3.6* 3.8 3.3* 3.2* 3.3*  CL 87* 99 90* 91* 94* 96  CO2 25  --  GLUCOSE 142* 142* 138* 130* 140* 114*  BUN CREATININE 0.65 0.70 0.59 0.60 0.56 0.67  CALCIUM 9.7  --  8.7 8.7 8.8 9.3  MG 1.9  --   --   --  1.8  --    Liver Function Tests:  Recent  Labs Lab 06/30/14 2220 07/02/14 0350  AST 23 22  ALT 23 19  ALKPHOS 151* 119*  BILITOT 0.7 0.7  PROT 6.6 5.6*  ALBUMIN 3.5 2.9*   No results found for this basename: LIPASE, AMYLASE,  in the last 168 hours No results found for this basename: AMMONIA,  in the last 168 hours CBC:  Recent Labs Lab 06/30/14 2220 06/30/14 2229 07/01/14 0755 07/02/14 0350 07/03/14 0955  WBC 8.7  --  12.3* 12.1* 11.6*  NEUTROABS 6.7  --   --   --   --   HGB 10.2* 10.9* 8.9* 7.9* 8.3*  HCT 30.8* 32.0* 26.8* 23.8* 25.5*  MCV 69.8*  --  70.0* 70.4* 70.4*  PLT 468*  --  406* 365 405*   Cardiac  Enzymes: No results found for this basename: CKTOTAL, CKMB, CKMBINDEX, TROPONINI,  in the last 168 hours BNP (last 3 results) No results found for this basename: PROBNP,  in the last 8760 hours CBG: No results found for this basename: GLUCAP,  in the last 168 hours  Recent Results (from the past 240 hour(s))  URINE CULTURE     Status: None   Collection Time    07/01/14 12:09 AM      Result Value Ref Range Status   Specimen Description URINE, RANDOM   Final   Special Requests NONE   Final   Culture  Setup Time     Final   Value: 07/01/2014 18:28     Performed at [facility] Count     Final   Value: >=100,000 COLONIES/ML     Performed at [facility]   Culture     Final   Value: ESCHERICHIA COLI     Performed at [facility]   Report Status 07/03/2014 FINAL   Final   Organism ID, Bacteria ESCHERICHIA COLI   Final  MRSA PCR SCREENING     Status: None   Collection Time    07/01/14  2:36 AM      Result Value Ref Range Status   MRSA by PCR NEGATIVE  NEGATIVE Final   Comment:            The GeneXpert MRSA Assay (FDA     approved for NASAL specimens     only), is one component of a     comprehensive MRSA colonization     surveillance program. It is not     intended to diagnose MRSA     infection nor to guide or     monitor treatment for     MRSA infections.     Studies: Dg Abd Portable 1v  07/02/2014   CLINICAL DATA:  Abdominal pain and distention.  EXAM: PORTABLE ABDOMEN - 1 VIEW  COMPARISON:  Pelvis dated 06/30/2014 and abdomen and pelvis CT dated 06/10/2014.  FINDINGS: Breathing motion blurring. Grossly normal bowel gas pattern without significant stool. Stable right hip fixation hardware. Thoracolumbar spine vertebroplasty material at 3 levels. Diffuse osteopenia. Lower lumbar spine degenerative changes.  IMPRESSION: No acute abnormality.   Electronically Signed   By: Gordan Payment M.D.   On: 07/02/2014 10:30    Scheduled Meds: . carvedilol   3.125 mg Oral BID WC  . cefTRIAXone (ROCEPHIN)  IV  1 g Intravenous Q24H  . cholecalciferol  3,000 Units Oral Daily  . enoxaparin (LOVENOX) injection  30 mg Subcutaneous Q24H  . levothyroxine  50 mcg Oral QAC breakfast  . losartan  50 mg Oral  Daily  . polyethylene glycol  17 g Oral Daily  . QUEtiapine  25 mg Oral QHS  . sodium chloride  3 mL Intravenous Q12H   Continuous Infusions:   Principal Problem:   Hyponatremia Active Problems:   Hypothyroidism   Osteoporosis   Hypertension   Anemia   Paroxysmal a-fib   Acute encephalopathy   UTI (lower urinary tract infection)   E-coli UTI    Time spent: >35 minutes     Esperanza Sheets  [facility] Pager [phone]. If 7PM-7AM, please contact night-coverage at [email or web address], password [email or web address] 07/03/2014, 12:11 PM  LOS: 3 days

## 2014-07-04 DIAGNOSIS — A498 Other bacterial infections of unspecified site: Secondary | ICD-10-CM

## 2014-07-04 LAB — BASIC METABOLIC PANEL
Anion gap: 11 (ref 5–15)
BUN: 18 mg/dL (ref 6–23)
CALCIUM: 8.5 mg/dL (ref 8.4–10.5)
CO2: 23 meq/L (ref 19–32)
Chloride: 96 mEq/L (ref 96–112)
Creatinine, Ser: 0.57 mg/dL (ref 0.50–1.10)
GFR calc Af Amer: 90 mL/min — ABNORMAL LOW (ref >90)
GFR, EST NON AFRICAN AMERICAN: 77 mL/min — AB (ref >90)
GLUCOSE: 106 mg/dL — AB (ref 70–99)
Potassium: 4.2 mEq/L (ref 3.7–5.3)
Sodium: 130 mEq/L — ABNORMAL LOW (ref 137–147)

## 2014-07-04 MED ORDER — ALBUTEROL SULFATE (2.5 MG/3ML) 0.083% IN NEBU: 2.5000 mg | INHALATION_SOLUTION | RESPIRATORY_TRACT | Status: AC | PRN

## 2014-07-04 MED ORDER — FERROUS SULFATE 325 (65 FE) MG PO TABS: 325.0000 mg | ORAL_TABLET | Freq: Two times a day (BID) | ORAL | Status: AC

## 2014-07-04 MED ORDER — AMOXICILLIN-POT CLAVULANATE 875-125 MG PO TABS: 1.0000 | ORAL_TABLET | Freq: Every day | ORAL | Status: DC

## 2014-07-04 MED ORDER — OXYCODONE-ACETAMINOPHEN 5-325 MG PO TABS: 1.0000 | ORAL_TABLET | Freq: Two times a day (BID) | ORAL | Status: DC | PRN

## 2014-07-04 MED ORDER — QUETIAPINE FUMARATE 25 MG PO TABS: 12.5000 mg | ORAL_TABLET | Freq: Every day | ORAL | Status: AC

## 2014-07-04 NOTE — Progress Notes (Signed)
Pt discharged to [facility] per MD order. Pt disoriented to place, time and situation at time of discharge. No family present at time of discharge to review discharge instructions. Pt IV and telemetry box removed prior to discharge. Pt transported to [facility] by Doctor, general practice via [facility].  All belongings sent with pt. Report given to receiving RN, Marisue Ivan. Joylene Grapes

## 2014-07-04 NOTE — Progress Notes (Signed)
CSW (Clinical Child psychotherapist) prepared pt dc packet and placed with shadow chart. CSW arranged non-emergent ambulance transport. Pt nurse and facility informed. CSW left message for pt daughter Celine Mans. CSW signing off.  Jayion Schneck, LCSWA [phone]

## 2014-07-04 NOTE — Progress Notes (Signed)
Clinical Social Work Department BRIEF PSYCHOSOCIAL ASSESSMENT 07/04/2014  Patient:  [name], [name]     Account Number:  [id number]     Admit date:  06/30/2014  Clinical Social Worker:  Harless Nakayama  Date/Time:  07/04/2014 10:00 AM  Referred by:  Physician  Date Referred:  07/04/2014 Referred for  SNF Placement   Other Referral:   Interview type:  Family Other interview type:   Spoke with pt daughter over the phone    PSYCHOSOCIAL DATA Living Status:  FACILITY Admitted from facility:  [facility] Level of care:  Skilled Nursing Facility Primary support name:  Margarita Mail Primary support relationship to patient:  CHILD, ADULT Degree of support available:   Pt has strong family support    CURRENT CONCERNS Current Concerns  Post-Acute Placement   Other Concerns:    SOCIAL WORK ASSESSMENT / PLAN CSW notified that pt is ready for dc back to SNF today. CSW called pt daughter Celine Mans who confirmed pt was admitted from [facility] and plan will be for her to return. Pt daughter informed CSW she is very pleased with care pt has gotten at the facility. Pt daughter does have concerns about pt potentially having to get reoriented to staff if she is placed on a different wing at facility. Pt was previously on [facility] and pt daughter would like for her to return there if possible. CSW informed pt daughter that CSW would relay this to facility.    CSW spoke with facility admissions and notified of above conversation and that pt is ready for dc today. They confirmed that pt can return today.   Assessment/plan status:  Psychosocial Support/Ongoing Assessment of Needs Other assessment/ plan:   Information/referral to community resources:   None needed    PATIENT'S/FAMILY'S RESPONSE TO PLAN OF CARE: Pt not oriented during assessment. Pt daughter very pleasant and agreeable to pt returning to [facility], LCSWA [phone]

## 2014-07-04 NOTE — Discharge Summary (Signed)
Physician Discharge Summary  [name] ZOX:[id number] DOB: 1919/02/25 DOA: 06/30/2014  PCP: Charlette Caffey, MD  Admit date: 06/30/2014 Discharge date: 07/04/2014  Time spent: >35 minutes  Recommendations for Outpatient Follow-up:  SNF F/u with PCP in 1 week  Discharge Diagnoses:  Principal Problem:   Hyponatremia Active Problems:   Hypothyroidism   Osteoporosis   Hypertension   Anemia   Paroxysmal a-fib   Acute encephalopathy   UTI (lower urinary tract infection)   E-coli UTI   Discharge Condition: stable   Diet recommendation: low sodium   Filed Weights   07/02/14 0439 07/03/14 0632 07/04/14 0500  Weight: 47.945 kg (105 lb 11.2 oz) 47.673 kg (105 lb 1.6 oz) 46.993 kg (103 lb 9.6 oz)    History of present illness:  80 y/o female with PMH of HTN, Hypothyroidism, PAF not obn AC (fall, bleeding risk), IDA, Dementia, vision and hearing loss, who presented from [facility] with acute encephaloapthy.  -Patient was recently hospitalized from 06/10/2014 to 06/15/2014 for intertrochanteric fracture of right hip, underwent ORIF on 06/13/2014.  -In the emergency department, daughter noted that patient had a fall and as a result was placed in a neck brace and had CT of her head and neck. In the emergency department, she was found to be hyponatremic with a sodium of 124 and urinalysis suggest urinary tract infection.   Hospital Course:  1. Acute encephalopathy likely due to UTI, on top of dementia UTI  -CT: no acute findings, but old CVA; d/w patient's daughter  (patient could not tolerate ASA in the past; they did nto want to start any new antiplateelts) -stop tramdol ( she is on scheduled tramadol and daughter says it makes her crazy).  -mental status improved; Pt is HOH 2. Hyponatremia likely due to triamterene/hydrochlorothiazide  -HypoNa resolved; d/c diuretics;   3. Urinary tract infection started the patient on empiric ceftriaxone. cx growing ecoli.  -atx changed  to PO to compete outpatient Rx 4. Fall PT and OT evaluation. Head CT and C-spine CT have moderate motion degraded exam, no obvious signs of fractures.  -cont fall precautions  5. Abdominal distention, resolving; exam unremarkable; abd xray:No acute abnormality; cont tinue bowel regimen as needed; supportive care;  6. Hypertension  -Continue carvedilol and losartan. Discontinue triamterene/hydrochlorothiazide.  7. Paroxysmal atrial fibrillation  -Rate controlled. Currently in sinus rhythm. Not on anticoagulation due to fall risk.  8. Anemia, IDA; no s/s of acute bleeding; started PO iron  9. Recent history of right hip ORIF Continue PT/OT.  Prognosis if guarded with medical issues, frequent falls -Pt is DNR  Procedures:  none (i.e. Studies not automatically included, echos, thoracentesis, etc; not x-rays)  Consultations:  none  Discharge Exam: Filed Vitals:   07/04/14 0500  BP: 140/65  Pulse: 87  Temp: 99 F (37.2 C)  Resp: 18    General: alert Cardiovascular: s1,s2 rrr Respiratory: CTA BL  Discharge Instructions  Discharge Instructions   Diet - low sodium heart healthy    Complete by:  As directed      Discharge instructions    Complete by:  As directed   Please follow up with primary care doctor in 1 week     Increase activity slowly    Complete by:  As directed             Medication List    STOP taking these medications       acetaminophen-codeine 300-30 MG per tablet  Commonly known as:  TYLENOL #3     traMADol 50 MG tablet  Commonly known as:  ULTRAM     triamterene-hydrochlorothiazide 37.5-25 MG per capsule  Commonly known as:  DYAZIDE      TAKE these medications       albuterol (2.5 MG/3ML) 0.083% nebulizer solution  Commonly known as:  PROVENTIL  Take 3 mLs (2.5 mg total) by nebulization every 2 (two) hours as needed for wheezing.     amoxicillin-clavulanate 875-125 MG per tablet  Commonly known as:  AUGMENTIN  Take 1 tablet by mouth 6  (six) times daily.     carvedilol 3.125 MG tablet  Commonly known as:  COREG  Take 1 tablet (3.125 mg total) by mouth 2 (two) times daily with a meal.     cholecalciferol 1000 UNITS tablet  Commonly known as:  VITAMIN D  Take 3,000 Units by mouth daily.     ferrous sulfate 325 (65 FE) MG tablet  Take 1 tablet (325 mg total) by mouth 2 (two) times daily with a meal.     levothyroxine 50 MCG tablet  Commonly known as:  SYNTHROID, LEVOTHROID  Take 50 mcg by mouth every morning.     losartan 50 MG tablet  Commonly known as:  COZAAR  Take 50 mg by mouth every morning.     oxyCODONE-acetaminophen 5-325 MG per tablet  Commonly known as:  PERCOCET/ROXICET  Take 1 tablet by mouth every 12 (twelve) hours as needed for severe pain.     polyethylene glycol packet  Commonly known as:  MIRALAX / GLYCOLAX  Take 17 g by mouth daily.     QUEtiapine 25 MG tablet  Commonly known as:  SEROQUEL  Take 0.5 tablets (12.5 mg total) by mouth at bedtime.       Allergies  Allergen Reactions  . Aspirin Other (See Comments)    Nose bleeds.   . Tramadol     Per daughter "makes her crazy"       Follow-up Information   Follow up with Paruchuri, Janace Hoard, MD In 1 week.   Specialty:  Internal Medicine   Contact information:   [address] [place] [address] [phone]        The results of significant diagnostics from this hospitalization (including imaging, microbiology, ancillary and laboratory) are listed below for reference.    Significant Diagnostic Studies: Ct Abdomen Pelvis Wo Contrast  06/10/2014   CLINICAL DATA:  Right flank pain.  Nausea.  EXAM: CT ABDOMEN AND PELVIS WITHOUT CONTRAST  TECHNIQUE: Multidetector CT imaging of the abdomen and pelvis was performed following the standard protocol without IV contrast.  COMPARISON:  None.  FINDINGS: Mild cardiomegaly. Minimal dependent atelectasis or scarring in the lung bases. No effusions.  Mild fullness of the renal pelves  bilaterally which could reflect extrarenal pelves or Mild chronic UPJ obstructions. Findings are similar to prior study. Large right lower pole renal cyst measures up to 7.5 cm. Multiple bilateral renal cysts present. No ureteral stones. Urinary bladder is unremarkable.  Liver, gallbladder, spleen, pancreas and adrenals have an unremarkable unenhanced appearance. Aorta and branch vessels are heavily calcified, non aneurysmal.  Sigmoid diverticulosis. No active diverticulitis. Moderate stool throughout the colon. Small bowel and stomach are decompressed, grossly unremarkable.  Numerous compression fractures in the thoracolumbar spine. Multi level Prior vertebral augmentation. Findings similar to prior study.  IMPRESSION: Mild fullness of the renal pelves bilaterally with decompressed ureters. This is stable since 2013. This could reflect extrarenal pelves or mild  chronic UPJ obstruction. No ureteral stones.  Bilateral renal cysts.  Sigmoid diverticulosis.  No active diverticulitis.  No acute findings in the abdomen or pelvis.   Electronically Signed   By: Charlett Nose M.D.   On: 06/10/2014 14:51   Dg Hip Operative Right  06/13/2014   CLINICAL DATA:  ORIF right hip  EXAM: DG OPERATIVE RIGHT HIP  TECHNIQUE: A series of 4 intraoperative fluoroscopic spot images of the right hip is submitted.  COMPARISON:  MR 06/11/2014  FINDINGS: IM rod and sliding screw placement across the right femoral neck. Patchy femoral-popliteal arterial calcifications.  IMPRESSION: 1. Internal fixation across right femoral neck.   Electronically Signed   By: Oley Balm M.D.   On: 06/13/2014 18:10   Ct Head Wo Contrast  06/30/2014   CLINICAL DATA:  Hyponatremia.  Dementia, altered mental status.  EXAM: CT HEAD WITHOUT CONTRAST  CT CERVICAL SPINE WITHOUT CONTRAST  TECHNIQUE: Multidetector CT imaging of the head and cervical spine was performed following the standard protocol without intravenous contrast. Multiplanar CT image  reconstructions of the cervical spine were also generated.  COMPARISON:  None available for comparison at time of study interpretation.  FINDINGS: CT HEAD FINDINGS  Moderately motion degraded examination. Part technologist note, patient was unable to remain still during the examination.  The ventricles and sulci are normal for age. No intraparenchymal hemorrhage, mass effect nor midline shift. Confluent supratentorial white matter hypodensities are non-specific suggest sequelae of chronic small vessel ischemic disease. Tiny hypodensities within the bilateral basal ganglia and thalamus. No acute large vascular territory infarcts.  No abnormal extra-axial fluid collections. Basal cisterns are patent. Moderate calcific atherosclerosis of the carotid siphons.  No skull fracture. The included ocular globes and orbital contents are non-suspicious. The mastoid aircells and included paranasal sinuses are well-aerated.  CT CERVICAL SPINE FINDINGS  Moderately motion degraded examination. Due to motion, there is a spurious duplicated appearance of the anterior arch of C1.  Mild C5 vertebral body height loss . The remaining cervical vertebra appear grossly intact. Maintenance of cervical lordosis. Moderate to severe multilevel disc degeneration. Bone mineral density is decreased without destructive bony lesions. Lateral masses and alignment. Included prevertebral and paraspinal soft tissues are nonsuspicious ; fibronodular scarring RIGHT lung apex.  IMPRESSION: CT head: Moderately motion degraded examination without convincing evidence of acute intracranial process. If clinical suspicion persists, consider repeat examination when patient is better able to the tolerate imaging.  Involutional changes. Moderate to severe white matter changes suggest chronic small vessel ischemic disease. Basal ganglia and thalamus lacunar infarcts.  CT cervical spine: Moderately motion degraded examination. Mild C5 vertebral body height loss could  be artifact or congenital variant, less likely acute injury. No malalignment. If clinical suspicion persists, consider repeat examination when patient is better able to the tolerate imaging.   Electronically Signed   By: Awilda Metro   On: 06/30/2014 23:32   Ct Cervical Spine Wo Contrast  06/30/2014   CLINICAL DATA:  Hyponatremia.  Dementia, altered mental status.  EXAM: CT HEAD WITHOUT CONTRAST  CT CERVICAL SPINE WITHOUT CONTRAST  TECHNIQUE: Multidetector CT imaging of the head and cervical spine was performed following the standard protocol without intravenous contrast. Multiplanar CT image reconstructions of the cervical spine were also generated.  COMPARISON:  None available for comparison at time of study interpretation.  FINDINGS: CT HEAD FINDINGS  Moderately motion degraded examination. Part technologist note, patient was unable to remain still during the examination.  The ventricles and sulci are  normal for age. No intraparenchymal hemorrhage, mass effect nor midline shift. Confluent supratentorial white matter hypodensities are non-specific suggest sequelae of chronic small vessel ischemic disease. Tiny hypodensities within the bilateral basal ganglia and thalamus. No acute large vascular territory infarcts.  No abnormal extra-axial fluid collections. Basal cisterns are patent. Moderate calcific atherosclerosis of the carotid siphons.  No skull fracture. The included ocular globes and orbital contents are non-suspicious. The mastoid aircells and included paranasal sinuses are well-aerated.  CT CERVICAL SPINE FINDINGS  Moderately motion degraded examination. Due to motion, there is a spurious duplicated appearance of the anterior arch of C1.  Mild C5 vertebral body height loss . The remaining cervical vertebra appear grossly intact. Maintenance of cervical lordosis. Moderate to severe multilevel disc degeneration. Bone mineral density is decreased without destructive bony lesions. Lateral masses and  alignment. Included prevertebral and paraspinal soft tissues are nonsuspicious ; fibronodular scarring RIGHT lung apex.  IMPRESSION: CT head: Moderately motion degraded examination without convincing evidence of acute intracranial process. If clinical suspicion persists, consider repeat examination when patient is better able to the tolerate imaging.  Involutional changes. Moderate to severe white matter changes suggest chronic small vessel ischemic disease. Basal ganglia and thalamus lacunar infarcts.  CT cervical spine: Moderately motion degraded examination. Mild C5 vertebral body height loss could be artifact or congenital variant, less likely acute injury. No malalignment. If clinical suspicion persists, consider repeat examination when patient is better able to the tolerate imaging.   Electronically Signed   By: Awilda Metro   On: 06/30/2014 23:32   Mr Lumbar Spine Wo Contrast  06/10/2014   CLINICAL DATA:  80 year old with low back pain radiating into the left leg. History of vertebroplasty. Urinary incontinence.  EXAM: MRI LUMBAR SPINE WITHOUT CONTRAST  TECHNIQUE: Multiplanar, multisequence MR imaging of the lumbar spine was performed. No intravenous contrast was administered.  COMPARISON:  MRI 01/16/2012.  Abdominal pelvic CT 06/10/2014.  FINDINGS: The lumbar alignment is stable and near anatomic. Multiple chronic thoracolumbar compression deformities are again noted. The patient has undergone previous spinal augmentation at T11, T12 and L1. Fractures at those levels have healed. There are chronic superior endplate compression deformities at L2 and L3 and inferior endplate compression deformities at L4 and L5. There is no evidence of acute fracture.  The conus medullaris extends to the L1-2 level and appears normal. No paraspinal abnormalities are identified.Multiple renal cysts are present bilaterally.  L1-2: Mild disc bulging. No significant spinal stenosis or nerve root encroachment.  L2-3:  Annular disc bulging eccentric to the left with mild facet and ligamentous hypertrophy. There is mild narrowing of the lateral recesses. The foramina are patent.  L3-4: Mild disc bulging, facet and ligamentous hypertrophy. No significant spinal stenosis or nerve root encroachment.  L4-5: Disc bulging and osteophytes are asymmetric to the right. There is mild facet and ligamentous hypertrophy. There is mild narrowing of the lateral recesses. The foramina are patent.  L5-S1: There are moderate facet degenerative changes with mild disc bulging eccentric to the right. There is mild inferior foraminal narrowing on the right without definite nerve root encroachment.  IMPRESSION: 1. Multiple healed thoracolumbar compression deformities status post spinal augmentation. No acute osseous findings. 2. Generally stable multilevel spondylosis. No high-grade spinal stenosis or nerve root encroachment demonstrated.   Electronically Signed   By: Roxy Horseman M.D.   On: 06/10/2014 17:21   Dg Pelvis Portable  06/30/2014   CLINICAL DATA:  Fall. Pelvic pain. Initial encounter. Recent ORIF of a  right femoral neck fracture.  EXAM: PORTABLE PELVIS 1-2 VIEWS  COMPARISON:  MRI pelvis 06/11/2014.  CT pelvis of that same date.  FINDINGS: ORIF of the previously identified right femoral neck fracture with anatomic alignment. Severe osseous demineralization. No visible acute fractures. Hip joints intact with symmetric moderate to severe inferolateral joint space narrowing. Sacroiliac joints and symphysis pubis intact.  IMPRESSION: No acute osseous abnormality.   Electronically Signed   By: Hulan Saas M.D.   On: 06/30/2014 22:53   Ct Hip Right Wo Contrast  06/11/2014   CLINICAL DATA:  Hip pain.  EXAM: CT OF THE RIGHT HIP WITHOUT CONTRAST  TECHNIQUE: Multidetector CT imaging was performed according to the standard protocol. Multiplanar CT image reconstructions were also generated.  COMPARISON:  CT 06/10/2014  FINDINGS: Diffuse  osteopenia and degenerative change present. An extremely subtle lucency is noted along the medial aspect of the base of the right femoral neck . Punctate area air is noted within the medullary bone adjacent to this region. These changes may be related to an extremely subtle fracture of the base of the right femoral neck. No displaced fractures noted. Sacrum and SI joints are intact were visualized. Sigmoid colonic diverticulosis. Right inguinal hernia with herniation of fat only.Peripheral vascular disease.  IMPRESSION: 1. Extremely subtle lucency is noted along the medial aspect of the base of the right femoral neck. Adjacent punctate area of air is noted in the medullary bone. These changes may be related to an extremely subtle fracture of the base of the right femoral neck. If further evaluation is needed MRI can be obtained. 2. Diffuse severe osteopenia and degenerative change.   Electronically Signed   By: Maisie Fus  Register   On: 06/11/2014 11:19   Mr Hip Right Wo Contrast  06/11/2014   CLINICAL DATA:  Persistent right hip pain. No reported acute injury or history of malignancy.  EXAM: MR OF THE RIGHT HIP WITHOUT CONTRAST  TECHNIQUE: Multiplanar, multisequence MR imaging was performed. No intravenous contrast was administered.  COMPARISON:  Right hip CT 06/11/2014, lumbar MRI 06/10/2014, pelvic CT 06/10/2014 and whole body bone scan 12/23/2011.  FINDINGS: Bones: There is a fracture of the proximal right femur extending laterally from the lesser trochanter, corresponding with the recent CT finding. This fracture extends into the intertrochanteric region, but appears incomplete. There is prominent surrounding bone marrow and soft tissue edema. There is a contralateral fracture of the left femoral neck medially with marrow edema, also appearing incomplete. In addition, there is a fracture of the left acetabular roof with a linear component best seen on the coronal images. The sacroiliac joints and symphysis  pubis appear normal.  Articular cartilage and labrum  Articular cartilage: There are mild degenerative changes at both hips. There is no evidence of femoral head avascular necrosis.  Labrum: There is acetabular labral degeneration without paralabral cyst formation.  Joint or bursal effusion  Joint effusion: Small bilateral hip joint effusions.  Bursae: There is no focal periarticular fluid collection. However, there is moderate edema surrounding the proximal right femur fracture.  Muscles and tendons  Muscles and tendons: The gluteus, hamstring and iliopsoas tendons appear normal. The piriformis muscles appear symmetric.  Other findings  Miscellaneous: Bilateral renal cysts are partially imaged. There are chronic compression deformities within the lower lumbar spine. There is prominent fat within both inguinal canals which appears stable.  IMPRESSION: 1. There are multiple fractures involving the medial intertrochanteric region of the right femur, the left femoral neck medially and the  left superior acetabulum. Given the presence of numerous thoracolumbar compression deformities on prior imaging, these are likely insufficiency fractures. No definite pathologic features or other suspicious marrow lesions identified. 2. The right femur fracture appears incomplete, although there is surrounding soft tissue edema. With continued weight-bearing, this is at risk to become a complete fracture.   Electronically Signed   By: Roxy Horseman M.D.   On: 06/11/2014 14:35   Dg Chest Portable 1 View  07/01/2014   CLINICAL DATA:  Altered mental status.  Status post fall.  EXAM: PORTABLE CHEST - 1 VIEW  COMPARISON:  Chest radiograph performed 06/10/2014  FINDINGS: The lungs are hyperexpanded, with flattening of the hemidiaphragms, compatible with COPD. There is no evidence of focal opacification, pleural effusion or pneumothorax.  The cardiomediastinal silhouette is mildly enlarged. No acute osseous abnormalities are seen. The  patient is status post vertebroplasty at multiple levels along the lower thoracic spine.  IMPRESSION: Findings of COPD. Mild cardiomegaly. Lungs otherwise grossly clear. No displaced rib fractures seen.   Electronically Signed   By: Roanna Raider M.D.   On: 07/01/2014 00:30   Dg Chest Port 1 View  06/10/2014   CLINICAL DATA:  Cough and shortness of breath. Right lower back pain.  EXAM: PORTABLE CHEST - 1 VIEW  COMPARISON:  01/17/2010  FINDINGS: Shallow inspiration. Borderline heart size with normal pulmonary vascularity. Peribronchial thickening and central interstitial changes suggesting chronic bronchitis. No focal airspace disease or consolidation in the lungs. No blunting of costophrenic angles. No pneumothorax. Calcified and tortuous aorta. Degenerative changes in the spine and shoulders. Multiple previous kyphoplasties in the thoracic spine.  IMPRESSION: Chronic bronchitic changes. No evidence of active pulmonary disease.   Electronically Signed   By: Burman Nieves M.D.   On: 06/10/2014 22:13   Dg Abd Portable 1v  07/02/2014   CLINICAL DATA:  Abdominal pain and distention.  EXAM: PORTABLE ABDOMEN - 1 VIEW  COMPARISON:  Pelvis dated 06/30/2014 and abdomen and pelvis CT dated 06/10/2014.  FINDINGS: Breathing motion blurring. Grossly normal bowel gas pattern without significant stool. Stable right hip fixation hardware. Thoracolumbar spine vertebroplasty material at 3 levels. Diffuse osteopenia. Lower lumbar spine degenerative changes.  IMPRESSION: No acute abnormality.   Electronically Signed   By: Gordan Payment M.D.   On: 07/02/2014 10:30    Microbiology: Recent Results (from the past 240 hour(s))  URINE CULTURE     Status: None   Collection Time    07/01/14 12:09 AM      Result Value Ref Range Status   Specimen Description URINE, RANDOM   Final   Special Requests NONE   Final   Culture  Setup Time     Final   Value: 07/01/2014 18:28     Performed at [facility] Count      Final   Value: >=100,000 COLONIES/ML     Performed at [facility]   Culture     Final   Value: ESCHERICHIA COLI     Performed at [facility]   Report Status 07/03/2014 FINAL   Final   Organism ID, Bacteria ESCHERICHIA COLI   Final  MRSA PCR SCREENING     Status: None   Collection Time    07/01/14  2:36 AM      Result Value Ref Range Status   MRSA by PCR NEGATIVE  NEGATIVE Final   Comment:            The GeneXpert MRSA  Assay (FDA     approved for NASAL specimens     only), is one component of a     comprehensive MRSA colonization     surveillance program. It is not     intended to diagnose MRSA     infection nor to guide or     monitor treatment for     MRSA infections.     Labs: Basic Metabolic Panel:  Recent Labs Lab 06/30/14 2220  07/01/14 0510 07/01/14 1642 07/02/14 0350 07/03/14 0955 07/04/14 0452  NA 124*  < > 125* 129* 132* 135* 130*  K 3.9  < > 3.8 3.3* 3.2* 3.3* 4.2  CL 87*  < > 90* 91* 94* 96 96  CO2 25  --  19 23 25 23 23   GLUCOSE 142*  < > 138* 130* 140* 114* 106*  BUN 16  < > 19 19 18 18 18   CREATININE 0.65  < > 0.59 0.60 0.56 0.67 0.57  CALCIUM 9.7  --  8.7 8.7 8.8 9.3 8.5  MG 1.9  --   --   --  1.8  --   --   < > = values in this interval not displayed. Liver Function Tests:  Recent Labs Lab 06/30/14 2220 07/02/14 0350  AST 23 22  ALT 23 19  ALKPHOS 151* 119*  BILITOT 0.7 0.7  PROT 6.6 5.6*  ALBUMIN 3.5 2.9*   No results found for this basename: LIPASE, AMYLASE,  in the last 168 hours No results found for this basename: AMMONIA,  in the last 168 hours CBC:  Recent Labs Lab 06/30/14 2220 06/30/14 2229 07/01/14 0755 07/02/14 0350 07/03/14 0955  WBC 8.7  --  12.3* 12.1* 11.6*  NEUTROABS 6.7  --   --   --   --   HGB 10.2* 10.9* 8.9* 7.9* 8.3*  HCT 30.8* 32.0* 26.8* 23.8* 25.5*  MCV 69.8*  --  70.0* 70.4* 70.4*  PLT 468*  --  406* 365 405*   Cardiac Enzymes: No results found for this basename: CKTOTAL, CKMB,  CKMBINDEX, TROPONINI,  in the last 168 hours BNP: BNP (last 3 results) No results found for this basename: PROBNP,  in the last 8760 hours CBG: No results found for this basename: GLUCAP,  in the last 168 hours     Signed:  Esperanza Sheets  [facility] 07/04/2014, 12:09 PM

## 2014-07-06 ENCOUNTER — Non-Acute Institutional Stay (SKILLED_NURSING_FACILITY): Payer: Medicare Other | Admitting: Internal Medicine

## 2014-07-06 DIAGNOSIS — I480 Paroxysmal atrial fibrillation: Secondary | ICD-10-CM

## 2014-07-06 DIAGNOSIS — N390 Urinary tract infection, site not specified: Secondary | ICD-10-CM

## 2014-07-06 DIAGNOSIS — I10 Essential (primary) hypertension: Secondary | ICD-10-CM

## 2014-07-06 DIAGNOSIS — E039 Hypothyroidism, unspecified: Secondary | ICD-10-CM

## 2014-07-06 DIAGNOSIS — I4891 Unspecified atrial fibrillation: Secondary | ICD-10-CM

## 2014-07-07 NOTE — Progress Notes (Signed)
HISTORY & PHYSICAL  DATE: 07/06/2014   FACILITY: [facility]  LEVEL OF CARE: SNF (31)  ALLERGIES:  Allergies  Allergen Reactions  . Aspirin Other (See Comments)    Nose bleeds.   . Tramadol     Per daughter "makes her crazy"    CHIEF COMPLAINT:  Manage UTI, hypothyroidism and atrial fibrillation  HISTORY OF PRESENT ILLNESS: 80 year old Caucasian female was hospitalized secondary to acute encephalopathy. After hospitalization she is  admitted to this facility for short-term rehabilitation. Patient is a poor historian due to severe deafness.  UTI: The UTI remains stable.  The staff deny ongoing suprapubic pain, flank pain, dysuria, urinary frequency, urinary hesitancy or hematuria.  No complications reported from the current antibiotic being used. Patient is on Augmentin.  ATRIAL FIBRILLATION: the patients atrial fibrillation remains stable.  The staff deny DOE, tachycardia, orthopnea, transient neurological sx, pedal edema, palpitations, & PNDs.  No complications noted from the medications currently being used.  HYPOTHYROIDISM: The hypothyroidism remains stable. No complications noted from the medications presently being used.  The staff deny fatigue or constipation.  Last TSH not available.  PAST MEDICAL HISTORY :  Past Medical History  Diagnosis Date  . Thyroid disease   . Hypertension   . Cannot hear   . Vision loss   . Dementia     PAST SURGICAL HISTORY: Past Surgical History  Procedure Laterality Date  . Cesarean section    . Abdominal hysterectomy    . Kyphoplasty    . Intramedullary (im) nail intertrochanteric Right 06/13/2014    Procedure: Affix Nail/Right;  Surgeon: Jodi Marble, MD;  Location: [facility] OR;  Service: Orthopedics;  Laterality: Right;    SOCIAL HISTORY:  reports that she has never smoked. She does not have any smokeless tobacco history on file. She reports that she does not drink alcohol or use illicit drugs.  FAMILY  HISTORY:  Family History  Problem Relation Age of Onset  . Breast cancer Sister   . Cancer Brother   . Liver disease Son   . Lung cancer Son     CURRENT MEDICATIONS: Reviewed per MAR/see medication list  REVIEW OF SYSTEMS: Unobtainable due to severe deafness  PHYSICAL EXAMINATION  VS:  See VS section  GENERAL: no acute distress, normal body habitus EYES: conjunctivae normal, sclerae normal, normal eye lids MOUTH/THROAT: Unable to assess NECK: supple, trachea midline, no neck masses, no thyroid tenderness, no thyromegaly LYMPHATICS: no LAN in the neck, no supraclavicular LAN RESPIRATORY: breathing is even & unlabored, BS CTAB CARDIAC: Heart rate is irregularly irregular, no murmur,no extra heart sounds, no edema GI:  ABDOMEN: abdomen soft, normal BS, no masses, no tenderness  LIVER/SPLEEN: no hepatomegaly, no splenomegaly MUSCULOSKELETAL: HEAD: normal to inspection  EXTREMITIES: Unable to assess RIGHT LOWER EXTREMITY:  full range of motion, normal strength & tone PSYCHIATRIC: the patient is alert & oriented to person, affect & behavior appropriate  LABS/RADIOLOGY:  Labs reviewed: Basic Metabolic Panel:  Recent Labs  78/29/56 1300 06/11/14 0730  06/30/14 2220  07/02/14 0350 07/03/14 0955 07/04/14 0452  NA 139 139  < > 124*  < > 132* 135* 130*  K 3.5* 4.1  < > 3.9  < > 3.2* 3.3* 4.2  CL 100 102  < > 87*  < > 94* 96 96  CO2 27 26  < > 25  < > GLUCOSE 132* 87  < > 142*  < >  140* 114* 106*  BUN 22 15  < > 16  < > 18 18 18   CREATININE 0.90 0.74  < > 0.65  < > 0.56 0.67 0.57  CALCIUM 10.1 8.9  < > 9.7  < > 8.8 9.3 8.5  MG  --  2.3  --  1.9  --  1.8  --   --   PHOS  --  3.0  --   --   --   --   --   --   < > = values in this interval not displayed. Liver Function Tests:  Recent Labs  06/11/14 0730 06/30/14 2220 07/02/14 0350  AST 11 23 22   ALT 12 23 19   ALKPHOS 94 151* 119*  BILITOT 1.0 0.7 0.7  PROT 5.8* 6.6 5.6*  ALBUMIN 3.1* 3.5 2.9*    CBC:  Recent Labs  06/12/14 0555 06/13/14 0501  06/30/14 2220  07/01/14 0755 07/02/14 0350 07/03/14 0955  WBC 10.5 10.1  < > 8.7  --  12.3* 12.1* 11.6*  NEUTROABS 8.2* 8.0*  --  6.7  --   --   --   --   HGB 9.8* 9.8*  < > 10.2*  < > 8.9* 7.9* 8.3*  HCT 29.6* 30.6*  < > 30.8*  < > 26.8* 23.8* 25.5*  MCV 68.2* 70.7*  < > 69.8*  --  70.0* 70.4* 70.4*  PLT PLATELET CLUMPS NOTED ON SMEAR, COUNT APPEARS ADEQUATE 345  < > 468*  --  406* 365 405*  < > = values in this interval not displayed.  Cardiac Enzymes:  Recent Labs  06/10/14 2052 06/11/14 0730 06/11/14 0733  TROPONINI <0.30 <0.30 <0.30   CBG:  Recent Labs  06/10/14 2019  GLUCAP 164*    Transthoracic Echocardiography  Patient:    Corley, Kohls MR #:       [id number] Study Date: 06/12/2014 Gender:     F Age:        76 Height:     165.1 cm Weight:     53.1 kg BSA:        1.55 m^2 Pt. Status: Room:       5W12C   ATTENDING    Jinger Neighbors, Prashant K  REFERRING    Leroy Sea  ADMITTING    Edsel Petrin [id number]  PERFORMING   [facility], Inpatient  SONOGRAPHER  Sheralyn Boatman  cc:  ------------------------------------------------------------------- LV EF: 60% -   65%  ------------------------------------------------------------------- Indications:      Chest pain 786.51.  ------------------------------------------------------------------- History:   PMH:  Paroxysmal AFIB.  Risk factors:  Hypertension.  ------------------------------------------------------------------- Study Conclusions  - Left ventricle: The cavity size was normal. There was mild   concentric hypertrophy. Systolic function was normal. The   estimated ejection fraction was in the range of 60% to 65%. Wall   motion was normal; there were no regional wall motion   abnormalities. Doppler parameters are consistent with abnormal   left ventricular relaxation (grade 1 diastolic dysfunction).   Doppler parameters  are consistent with elevated ventricular   end-diastolic filling pressure. - Aortic valve: Trileaflet; mildly thickened, mildly calcified   leaflets. Transvalvular velocity was within the normal range.   There was no stenosis. There was mild regurgitation. - Aortic root: The aortic root was normal in size. - Mitral valve: Calcified annulus. There was mild regurgitation. - Left atrium: The atrium was mildly dilated. - Right ventricle: Systolic function was  normal. - Right atrium: The atrium was normal in size. - Tricuspid valve: There was mild regurgitation. - Pulmonary arteries: Systolic pressure was within the normal   range. PA peak pressure: 35 mm Hg (S). - Inferior vena cava: The vessel was normal in size. - Pericardium, extracardiac: There was no pericardial effusion.  Transthoracic echocardiography.  M-mode, complete 2D, spectral Doppler, and color Doppler.  Birthdate:  Patient birthdate: 01-15-1919.  Age:  Patient is 80 yr old.  Sex:  Gender: female. BMI: 19.5 kg/m^2.  Blood pressure:     147/70  Patient status: Inpatient.  Study date:  Study date: 06/12/2014. Study time: 03:20 PM.  Location:  Bedside.  -------------------------------------------------------------------  ------------------------------------------------------------------- Left ventricle:  The cavity size was normal. There was mild concentric hypertrophy. Systolic function was normal. The estimated ejection fraction was in the range of 60% to 65%. Wall motion was normal; there were no regional wall motion abnormalities. Doppler parameters are consistent with abnormal left ventricular relaxation (grade 1 diastolic dysfunction). Doppler parameters are consistent with elevated ventricular end-diastolic filling pressure.  ------------------------------------------------------------------- Aortic valve:   Trileaflet; mildly thickened, mildly calcified leaflets. Mobility was not restricted.  Doppler:   Transvalvular velocity was within the normal range. There was no stenosis. There was mild regurgitation.  ------------------------------------------------------------------- Aorta:  Aortic root: The aortic root was normal in size.  ------------------------------------------------------------------- Mitral valve:   Calcified annulus. Mobility was not restricted. Doppler:  Transvalvular velocity was within the normal range. There was no evidence for stenosis. There was mild regurgitation.    Peak gradient (D): 4 mm Hg.  ------------------------------------------------------------------- Left atrium:  The atrium was mildly dilated.  ------------------------------------------------------------------- Right ventricle:  The cavity size was normal. Wall thickness was normal. Systolic function was normal.  ------------------------------------------------------------------- Pulmonic valve:    Doppler:  Transvalvular velocity was within the normal range. There was no evidence for stenosis.  ------------------------------------------------------------------- Tricuspid valve:   Structurally normal valve.    Doppler: Transvalvular velocity was within the normal range. There was mild regurgitation.  ------------------------------------------------------------------- Pulmonary artery:   The main pulmonary artery was normal-sized. Systolic pressure was within the normal range.  ------------------------------------------------------------------- Right atrium:  The atrium was normal in size.  ------------------------------------------------------------------- Pericardium:  There was no pericardial effusion.  ------------------------------------------------------------------- Systemic veins: Inferior vena cava: The vessel was normal in size.  ------------------------------------------------------------------- Measurements   Left ventricle                              Value         Reference  LV ID, ED, PLAX chordal             (L)     31.4  mm     43 - 52  LV ID, ES, PLAX chordal             (L)     21.8  mm     23 - 38  LV fx shortening, PLAX chordal              31    %      >=29  LV PW thickness, ED                         12.4  mm     ---------  IVS/LV PW ratio, ED  0.97         <=1.3  LV e&', lateral                              12.2  cm/s   ---------  LV E/e&', lateral                            8.2          ---------  LV e&', medial                               14.2  cm/s   ---------  LV E/e&', medial                             7.04         ---------  LV e&', average                              13.2  cm/s   ---------  LV E/e&', average                            7.58         ---------    Ventricular septum                          Value        Reference  IVS thickness, ED                           12    mm     ---------    Aorta                                       Value        Reference  Aortic root ID, ED                          33    mm     ---------    Left atrium                                 Value        Reference  LA ID, A-P, ES                              38    mm     ---------  LA ID/bsa, A-P                      (H)     2.44  cm/m^2 <=2.2  LA volume/bsa, S                            36.1  ml/m^2 ---------    Mitral valve  Value        Reference  Mitral E-wave peak velocity                 100   cm/s   ---------  Mitral A-wave peak velocity                 104   cm/s   ---------  Mitral deceleration time                    190   ms     150 - 230  Mitral peak gradient, D                     4     mm Hg  ---------  Mitral E/A ratio, peak                      1            ---------    Pulmonary arteries                          Value        Reference  PA pressure, S, DP                  (H)     35    mm Hg  <=30    Tricuspid valve                             Value        Reference   Tricuspid regurg peak velocity              262   cm/s   ---------  Tricuspid peak RV-RA gradient               27    mm Hg  ---------  Tricuspid maximal regurg velocity,          262   cm/s   ---------  PISA    Systemic veins                              Value        Reference  Estimated CVP                               8     mm Hg  ---------    Right ventricle                             Value        Reference  RV pressure, S, DP                  (H)     35    mm Hg  <=30  Legend: (L)  and  (H)  mark values outside specified reference range.  PORTABLE PELVIS 1-2 VIEWS   COMPARISON:  MRI pelvis 06/11/2014.  CT pelvis of that same date.   FINDINGS: ORIF of the previously identified right femoral neck fracture with anatomic alignment. Severe osseous demineralization. No visible acute fractures. Hip joints intact with symmetric moderate to severe inferolateral joint space narrowing. Sacroiliac joints and symphysis pubis intact.  IMPRESSION: No acute osseous abnormality. CT HEAD WITHOUT CONTRAST   CT CERVICAL SPINE WITHOUT CONTRAST   TECHNIQUE: Multidetector CT imaging of the head and cervical spine was performed following the standard protocol without intravenous contrast. Multiplanar CT image reconstructions of the cervical spine were also generated.   COMPARISON:  None available for comparison at time of study interpretation.   FINDINGS: CT HEAD FINDINGS   Moderately motion degraded examination. Part technologist note, patient was unable to remain still during the examination.   The ventricles and sulci are normal for age. No intraparenchymal hemorrhage, mass effect nor midline shift. Confluent supratentorial white matter hypodensities are non-specific suggest sequelae of chronic small vessel ischemic disease. Tiny hypodensities within the bilateral basal ganglia and thalamus. No acute large vascular territory infarcts.   No abnormal extra-axial fluid  collections. Basal cisterns are patent. Moderate calcific atherosclerosis of the carotid siphons.   No skull fracture. The included ocular globes and orbital contents are non-suspicious. The mastoid aircells and included paranasal sinuses are well-aerated.   CT CERVICAL SPINE FINDINGS   Moderately motion degraded examination. Due to motion, there is a spurious duplicated appearance of the anterior arch of C1.   Mild C5 vertebral body height loss . The remaining cervical vertebra appear grossly intact. Maintenance of cervical lordosis. Moderate to severe multilevel disc degeneration. Bone mineral density is decreased without destructive bony lesions. Lateral masses and alignment. Included prevertebral and paraspinal soft tissues are nonsuspicious ; fibronodular scarring RIGHT lung apex.   IMPRESSION: CT head: Moderately motion degraded examination without convincing evidence of acute intracranial process. If clinical suspicion persists, consider repeat examination when patient is better able to the tolerate imaging.   Involutional changes. Moderate to severe white matter changes suggest chronic small vessel ischemic disease. Basal ganglia and thalamus lacunar infarcts.   CT cervical spine: Moderately motion degraded examination. Mild C5 vertebral body height loss could be artifact or congenital variant, less likely acute injury. No malalignment. If clinical suspicion persists, consider repeat examination when patient is better able to the tolerate imaging.     PORTABLE CHEST - 1 VIEW   COMPARISON:  Chest radiograph performed 06/10/2014   FINDINGS: The lungs are hyperexpanded, with flattening of the hemidiaphragms, compatible with COPD. There is no evidence of focal opacification, pleural effusion or pneumothorax.   The cardiomediastinal silhouette is mildly enlarged. No acute osseous abnormalities are seen. The patient is status post vertebroplasty at multiple levels  along the lower thoracic spine.   IMPRESSION: Findings of COPD. Mild cardiomegaly. Lungs otherwise grossly clear. No displaced rib fractures seen. PORTABLE ABDOMEN - 1 VIEW   COMPARISON:  Pelvis dated 06/30/2014 and abdomen and pelvis CT dated 06/10/2014.   FINDINGS: Breathing motion blurring. Grossly normal bowel gas pattern without significant stool. Stable right hip fixation hardware. Thoracolumbar spine vertebroplasty material at 3 levels. Diffuse osteopenia. Lower lumbar spine degenerative changes.   IMPRESSION: No acute abnormality.  ASSESSMENT/PLAN:  UTI-continue augmentin Atrial fibrillation-rate controlled Hypothyroidism-continue levothyroxine Hypertension -- well-controlled Recent right hip fracture-status post ORIF. Continue rehabilitation. Anemia-continue iron. Check hemoglobin. Constipation-well-controlled Check CBC with differential  I have reviewed patient's medical records received at admission/from hospitalization.  CPT CODE: 62952  Angela Cox, MD [facility] [phone]

## 2014-07-10 ENCOUNTER — Other Ambulatory Visit: Payer: Self-pay | Admitting: *Deleted

## 2014-07-10 MED ORDER — LORAZEPAM 0.5 MG PO TABS: ORAL_TABLET | ORAL | Status: DC

## 2014-07-10 NOTE — Telephone Encounter (Signed)
[facility] 

## 2014-07-11 ENCOUNTER — Other Ambulatory Visit: Payer: Self-pay | Admitting: Orthopedic Surgery

## 2014-07-11 DIAGNOSIS — M545 Low back pain, unspecified: Secondary | ICD-10-CM

## 2014-07-11 DIAGNOSIS — M25552 Pain in left hip: Secondary | ICD-10-CM

## 2014-07-11 DIAGNOSIS — Z9181 History of falling: Secondary | ICD-10-CM

## 2014-07-20 ENCOUNTER — Ambulatory Visit
Admission: RE | Admit: 2014-07-20 | Discharge: 2014-07-20 | Disposition: A | Discharge status: Home / Self Care | Payer: Medicare Other | Source: Ambulatory Visit | Attending: Orthopedic Surgery | Admitting: Orthopedic Surgery

## 2014-07-20 DIAGNOSIS — M545 Low back pain, unspecified: Secondary | ICD-10-CM

## 2014-07-20 DIAGNOSIS — Z9181 History of falling: Secondary | ICD-10-CM

## 2014-07-20 DIAGNOSIS — M25552 Pain in left hip: Secondary | ICD-10-CM

## 2014-07-21 LAB — URINE CULTURE: Colony Count: >=100000

## 2014-07-28 ENCOUNTER — Encounter: Payer: Self-pay | Admitting: Adult Health

## 2014-07-28 ENCOUNTER — Non-Acute Institutional Stay (SKILLED_NURSING_FACILITY): Payer: Medicare Other | Admitting: Adult Health

## 2014-07-28 DIAGNOSIS — B9620 Unspecified Escherichia coli [E. coli] as the cause of diseases classified elsewhere: Secondary | ICD-10-CM

## 2014-07-28 DIAGNOSIS — I480 Paroxysmal atrial fibrillation: Secondary | ICD-10-CM

## 2014-07-28 DIAGNOSIS — I10 Essential (primary) hypertension: Secondary | ICD-10-CM

## 2014-07-28 DIAGNOSIS — F329 Major depressive disorder, single episode, unspecified: Secondary | ICD-10-CM

## 2014-07-28 DIAGNOSIS — N390 Urinary tract infection, site not specified: Secondary | ICD-10-CM

## 2014-07-28 DIAGNOSIS — E039 Hypothyroidism, unspecified: Secondary | ICD-10-CM

## 2014-07-28 DIAGNOSIS — F32A Depression, unspecified: Secondary | ICD-10-CM

## 2014-07-28 DIAGNOSIS — S72141D Displaced intertrochanteric fracture of right femur, subsequent encounter for closed fracture with routine healing: Secondary | ICD-10-CM

## 2014-07-28 DIAGNOSIS — F419 Anxiety disorder, unspecified: Secondary | ICD-10-CM

## 2014-07-28 NOTE — Progress Notes (Signed)
Patient ID: [name], female   DOB: 21-Jul-1919, 80 y.o.   MRN: [id number]              PROGRESS NOTE  DATE: 07/28/2014   FACILITY: [facility]  LEVEL OF CARE: SNF (31)  Discharge Notes  HISTORY OF PRESENT ILLNESS:   This is a 80 year old female who is for discharge home with Home health PT, OT, Nursing, CNA and SW. She has been [facility].  Patient was admitted to this facility for short-term rehabilitation after the patient's recent hospitalization.  Patient has completed SNF rehabilitation and therapy has cleared the patient for discharge.  Reassessment of ongoing problem(s):  HTN: Pt 's HTN remains stable.  Denies CP, sob, DOE, pedal edema, headaches, dizziness or visual disturbances.  No complications from the medications currently being used.  Last BP : 137/71  HYPOTHYROIDISM: The hypothyroidism remains stable. No complications noted from the medications presently being used.  The patient denies fatigue or constipation.  9/15 TSH 2.43  ATRIAL FIBRILLATION: the patients atrial fibrillation remains stable.  The patient denies DOE, tachycardia, orthopnea, transient neurological sx, pedal edema, palpitations, & PNDs.  No complications noted from the medications currently being used.   Past Medical History  Diagnosis Date  . Thyroid disease   . Hypertension   . Cannot hear   . Vision loss   . Dementia      Reviewed.  No changes/see problem list  CURRENT MEDICATIONS: Reviewed per MAR/see medication list    Allergies  Allergen Reactions  . Aspirin Other (See Comments)    Nose bleeds.   . Tramadol     Per daughter "makes her crazy"    REVIEW OF SYSTEMS: not a good historian  PHYSICAL EXAMINATION  GENERAL: no acute distress, normal body habitus EYES: conjunctivae normal, sclerae normal, normal eye lids NECK: supple, trachea midline, no neck masses, no thyroid tenderness, no  thyromegaly LYMPHATICS: no LAN in the neck, no supraclavicular LAN RESPIRATORY: breathing is even & unlabored, BS CTAB CARDIAC: RRR, no murmur,no extra heart sounds, no edema GI: abdomen soft, normal BS, no masses, no tenderness, no hepatomegaly, no splenomegaly EXTREMITIES: able to move all 4 extremities PSYCHIATRIC: the patient is alert & oriented to person, disoriented to time and place; affect & behavior appropriate  LABS/RADIOLOGY:  07/12/14  TSH 2.43 07/07/14 WBC 9.2 hemoglobin 8.0 hematocrit 26.5 MCV 76.8 Labs reviewed: Basic Metabolic Panel:  Recent Labs  40/98/1108/29/15 1300 06/11/14 0730  06/30/14 2220  07/02/14 0350 07/03/14 0955 07/04/14 0452  NA 139 139  < > 124*  < > 132* 135* 130*  K 3.5* 4.1  < > 3.9  < > 3.2* 3.3* 4.2  CL 100 102  < > 87*  < > 94* 96 96  CO2 27 26  < > 25  < > 25 23 23   GLUCOSE 132* 87  < > 142*  < > 140* 114* 106*  BUN 22 15  < > 16  < > 18 18 18   CREATININE 0.90 0.74  < > 0.65  < > 0.56 0.67 0.57  CALCIUM 10.1 8.9  < > 9.7  < > 8.8 9.3 8.5  MG  --  2.3  --  1.9  --  1.8  --   --   PHOS  --  3.0  --   --   --   --   --   --   < > =  values in this interval not displayed.  Liver Function Tests:  Recent Labs  06/11/14 0730 06/30/14 2220 07/02/14 0350  AST 11 23 22   ALT 12 23 19   ALKPHOS 94 151* 119*  BILITOT 1.0 0.7 0.7  PROT 5.8* 6.6 5.6*  ALBUMIN 3.1* 3.5 2.9*   CBC:  Recent Labs  06/12/14 0555 06/13/14 0501  06/30/14 2220  07/01/14 0755 07/02/14 0350 07/03/14 0955  WBC 10.5 10.1  < > 8.7  --  12.3* 12.1* 11.6*  NEUTROABS 8.2* 8.0*  --  6.7  --   --   --   --   HGB 9.8* 9.8*  < > 10.2*  < > 8.9* 7.9* 8.3*  HCT 29.6* 30.6*  < > 30.8*  < > 26.8* 23.8* 25.5*  MCV 68.2* 70.7*  < > 69.8*  --  70.0* 70.4* 70.4*  PLT PLATELET CLUMPS NOTED ON SMEAR, COUNT APPEARS ADEQUATE 345  < > 468*  --  406* 365 405*  < > = values in this interval not displayed.  Cardiac Enzymes:  Recent Labs  06/10/14 2052 06/11/14 0730 06/11/14 0733    TROPONINI <0.30 <0.30 <0.30    CBG:  Recent Labs  06/10/14 2019  GLUCAP 164*     Ct Head Wo Contrast  06/30/2014   CLINICAL DATA:  Hyponatremia.  Dementia, altered mental status.  EXAM: CT HEAD WITHOUT CONTRAST  CT CERVICAL SPINE WITHOUT CONTRAST  TECHNIQUE: Multidetector CT imaging of the head and cervical spine was performed following the standard protocol without intravenous contrast. Multiplanar CT image reconstructions of the cervical spine were also generated.  COMPARISON:  None available for comparison at time of study interpretation.  FINDINGS: CT HEAD FINDINGS  Moderately motion degraded examination. Part technologist note, patient was unable to remain still during the examination.  The ventricles and sulci are normal for age. No intraparenchymal hemorrhage, mass effect nor midline shift. Confluent supratentorial white matter hypodensities are non-specific suggest sequelae of chronic small vessel ischemic disease. Tiny hypodensities within the bilateral basal ganglia and thalamus. No acute large vascular territory infarcts.  No abnormal extra-axial fluid collections. Basal cisterns are patent. Moderate calcific atherosclerosis of the carotid siphons.  No skull fracture. The included ocular globes and orbital contents are non-suspicious. The mastoid aircells and included paranasal sinuses are well-aerated.  CT CERVICAL SPINE FINDINGS  Moderately motion degraded examination. Due to motion, there is a spurious duplicated appearance of the anterior arch of C1.  Mild C5 vertebral body height loss . The remaining cervical vertebra appear grossly intact. Maintenance of cervical lordosis. Moderate to severe multilevel disc degeneration. Bone mineral density is decreased without destructive bony lesions. Lateral masses and alignment. Included prevertebral and paraspinal soft tissues are nonsuspicious ; fibronodular scarring RIGHT lung apex.  IMPRESSION: CT head: Moderately motion degraded examination  without convincing evidence of acute intracranial process. If clinical suspicion persists, consider repeat examination when patient is better able to the tolerate imaging.  Involutional changes. Moderate to severe white matter changes suggest chronic small vessel ischemic disease. Basal ganglia and thalamus lacunar infarcts.  CT cervical spine: Moderately motion degraded examination. Mild C5 vertebral body height loss could be artifact or congenital variant, less likely acute injury. No malalignment. If clinical suspicion persists, consider repeat examination when patient is better able to the tolerate imaging.   Electronically Signed   By: Awilda Metro   On: 06/30/2014 23:32   Ct Cervical Spine Wo Contrast  06/30/2014   CLINICAL DATA:  Hyponatremia.  Dementia, altered  mental status.  EXAM: CT HEAD WITHOUT CONTRAST  CT CERVICAL SPINE WITHOUT CONTRAST  TECHNIQUE: Multidetector CT imaging of the head and cervical spine was performed following the standard protocol without intravenous contrast. Multiplanar CT image reconstructions of the cervical spine were also generated.  COMPARISON:  None available for comparison at time of study interpretation.  FINDINGS: CT HEAD FINDINGS  Moderately motion degraded examination. Part technologist note, patient was unable to remain still during the examination.  The ventricles and sulci are normal for age. No intraparenchymal hemorrhage, mass effect nor midline shift. Confluent supratentorial white matter hypodensities are non-specific suggest sequelae of chronic small vessel ischemic disease. Tiny hypodensities within the bilateral basal ganglia and thalamus. No acute large vascular territory infarcts.  No abnormal extra-axial fluid collections. Basal cisterns are patent. Moderate calcific atherosclerosis of the carotid siphons.  No skull fracture. The included ocular globes and orbital contents are non-suspicious. The mastoid aircells and included paranasal sinuses are  well-aerated.  CT CERVICAL SPINE FINDINGS  Moderately motion degraded examination. Due to motion, there is a spurious duplicated appearance of the anterior arch of C1.  Mild C5 vertebral body height loss . The remaining cervical vertebra appear grossly intact. Maintenance of cervical lordosis. Moderate to severe multilevel disc degeneration. Bone mineral density is decreased without destructive bony lesions. Lateral masses and alignment. Included prevertebral and paraspinal soft tissues are nonsuspicious ; fibronodular scarring RIGHT lung apex.  IMPRESSION: CT head: Moderately motion degraded examination without convincing evidence of acute intracranial process. If clinical suspicion persists, consider repeat examination when patient is better able to the tolerate imaging.  Involutional changes. Moderate to severe white matter changes suggest chronic small vessel ischemic disease. Basal ganglia and thalamus lacunar infarcts.  CT cervical spine: Moderately motion degraded examination. Mild C5 vertebral body height loss could be artifact or congenital variant, less likely acute injury. No malalignment. If clinical suspicion persists, consider repeat examination when patient is better able to the tolerate imaging.   Electronically Signed   By: Awilda Metro   On: 06/30/2014 23:32   Mr Lumbar Spine Wo Contrast  07/20/2014   CLINICAL DATA:  Left hip and low back pain pain secondary to 2 falls from a 2 weeks ago. Pain with walking and standing.  EXAM: MRI LUMBAR SPINE WITHOUT CONTRAST  TECHNIQUE: Multiplanar, multisequence MR imaging of the lumbar spine was performed. No intravenous contrast was administered.  COMPARISON:  MRI lumbar spine 06/10/2014.  FINDINGS: There is marrow edema in the sacrum bilaterally consistent with fracture. Signal change is worse on the right. The patient has remote thoracic and lumbar compression fractures at all imaged levels, T9-L5 and is status post vertebral augmentation at T9, T11,  T12 and L1. The conus medullaris is normal in signal and position. Imaged intra-abdominal contents demonstrate T2 hyperintense lesion in the kidneys consistent with cysts.  The T9-10 and T10-11 levels imaged in the sagittal plane only. Mild endplate spurring is seen at T10-11 but the central canal and foramina appear open at each level.  T11-12: Minimal disc bulge without central canal or foraminal narrowing.  T12-L1: Mild bony retropulsion off the superior plate of R60 without central canal or foraminal narrowing.  L1-2:  Negative.  L2-3: Left paracentral and lateral recess protrusion is unchanged. The thecal sac is widely patent with mild narrowing in the left lateral recess identified. Foramina are open.  L3-4:  Mild disc bulge without central canal or foraminal stenosis.  L4-5:  Mild disc bulge without central canal or foraminal  narrowing.  L5-S1: There is some facet degenerative change. The disc is uncovered with a shallow bulge. Central canal and foramina are open.  IMPRESSION: Acute or subacute bilateral sacral fractures are incompletely visualized. No other acute abnormality is identified.  Remote compression fractures from T9-L5, unchanged.  Overall mild lumbar degenerative disease as detailed above stable in appearance.   Electronically Signed   By: Drusilla Kanner M.D.   On: 07/20/2014 13:42   Dg Pelvis Portable  06/30/2014   CLINICAL DATA:  Fall. Pelvic pain. Initial encounter. Recent ORIF of a right femoral neck fracture.  EXAM: PORTABLE PELVIS 1-2 VIEWS  COMPARISON:  MRI pelvis 06/11/2014.  CT pelvis of that same date.  FINDINGS: ORIF of the previously identified right femoral neck fracture with anatomic alignment. Severe osseous demineralization. No visible acute fractures. Hip joints intact with symmetric moderate to severe inferolateral joint space narrowing. Sacroiliac joints and symphysis pubis intact.  IMPRESSION: No acute osseous abnormality.   Electronically Signed   By: Hulan Saas  M.D.   On: 06/30/2014 22:53   Mr Hip Left Wo Contrast  07/20/2014   CLINICAL DATA:  Left hip and low back pain.  Recent falls.  EXAM: MR OF THE LEFT HIP WITHOUT CONTRAST  TECHNIQUE: Multiplanar, multisequence MR imaging was performed. No intravenous contrast was administered.  COMPARISON:  Multiple exams, including 06/10/2014 and 06/30/2014  FINDINGS: Bones: Acute on chronic fractures of both pubic rami. Bilateral sacral fractures. 110 CC ovoid hematoma along the superficial margin of the right obturator internus muscle, new compared to prior pelvic CT of 06/10/2014, displacing the urinary bladder to the left. Right hip screw.  Articular cartilage and labrum  Articular cartilage:  Not readily assessed due to motion.  Labrum:  Not readily assessed due to motion.  Joint or bursal effusion  Joint effusion:  None  Bursae:  None  Muscles and tendons  Muscles and tendons: There is edema in the right gluteus medius and minimus, and tracking along the lower pelvic musculature in general.  Other findings  Miscellaneous:  Small amount of hematoma in the space of Retzius.  IMPRESSION: 1. Acute on chronic fractures of the pubic rami neck. Bilateral acute sacral fractures. 2. 110 cc ovoid hematoma along the superficial margin of the right obturator internus muscle, displacing the bladder to the left. 3. Small amount of hematoma in the space of Retzius.   Electronically Signed   By: Herbie Baltimore M.D.   On: 07/20/2014 13:54   Dg Chest Portable 1 View  07/01/2014   CLINICAL DATA:  Altered mental status.  Status post fall.  EXAM: PORTABLE CHEST - 1 VIEW  COMPARISON:  Chest radiograph performed 06/10/2014  FINDINGS: The lungs are hyperexpanded, with flattening of the hemidiaphragms, compatible with COPD. There is no evidence of focal opacification, pleural effusion or pneumothorax.  The cardiomediastinal silhouette is mildly enlarged. No acute osseous abnormalities are seen. The patient is status post vertebroplasty at  multiple levels along the lower thoracic spine.  IMPRESSION: Findings of COPD. Mild cardiomegaly. Lungs otherwise grossly clear. No displaced rib fractures seen.   Electronically Signed   By: Roanna Raider M.D.   On: 07/01/2014 00:30   Dg Abd Portable 1v  07/02/2014   CLINICAL DATA:  Abdominal pain and distention.  EXAM: PORTABLE ABDOMEN - 1 VIEW  COMPARISON:  Pelvis dated 06/30/2014 and abdomen and pelvis CT dated 06/10/2014.  FINDINGS: Breathing motion blurring. Grossly normal bowel gas pattern without significant stool. Stable right hip fixation hardware. Thoracolumbar  spine vertebroplasty material at 3 levels. Diffuse osteopenia. Lower lumbar spine degenerative changes.  IMPRESSION: No acute abnormality.   Electronically Signed   By: Gordan PaymentSteve  Reid M.D.   On: 07/02/2014 10:30     ASSESSMENT/PLAN:  Right hip fracture status post ORIF - for home health PT, OT, and nursing, social worker and CNA UTI (new) - difficult to shows Escherichia coli > 100,000  CFU/ml; start 100 mg 1 by mouth twice a day x7 days Hypothyroidism - continue Synthroid Hypertension - stable; continue Cozaar and Coreg Anemia, acute blood loss - continue ferrous sulfate Depression - continue Seroquel Anxiety - stable; continue Ativan when necessary A. Fib - rate controlled; not on anticoagulation due to fall risk   I have filled out patient's discharge paperwork and written prescriptions.  Patient will receive home health PT, OT, SW, Nursing and CNA.  Total discharge time: Greater than 30 minutes  Discharge time involved coordination of the discharge process with social worker, nursing staff and therapy department. Medical justification for home health services/DME verified.  CPT CODE: [id number]  [facility] Hospital[facility]-VARGAS,Travious Vanover, NP [facility] [phone]

## 2014-08-29 ENCOUNTER — Emergency Department (HOSPITAL_COMMUNITY): Payer: Medicare Other

## 2014-08-29 ENCOUNTER — Encounter (HOSPITAL_COMMUNITY): Payer: Self-pay

## 2014-08-29 ENCOUNTER — Observation Stay (HOSPITAL_COMMUNITY)
Admission: EM | Admit: 2014-08-29 | Discharge: 2014-08-31 | Disposition: A | Discharge status: Home / Self Care | Payer: Medicare Other | Attending: Internal Medicine | Admitting: Internal Medicine

## 2014-08-29 DIAGNOSIS — Z888 Allergy status to other drugs, medicaments and biological substances status: Secondary | ICD-10-CM | POA: Diagnosis not present

## 2014-08-29 DIAGNOSIS — F419 Anxiety disorder, unspecified: Secondary | ICD-10-CM | POA: Diagnosis not present

## 2014-08-29 DIAGNOSIS — W19XXXA Unspecified fall, initial encounter: Secondary | ICD-10-CM

## 2014-08-29 DIAGNOSIS — E876 Hypokalemia: Secondary | ICD-10-CM | POA: Insufficient documentation

## 2014-08-29 DIAGNOSIS — F329 Major depressive disorder, single episode, unspecified: Secondary | ICD-10-CM | POA: Insufficient documentation

## 2014-08-29 DIAGNOSIS — Z886 Allergy status to analgesic agent status: Secondary | ICD-10-CM | POA: Insufficient documentation

## 2014-08-29 DIAGNOSIS — M25531 Pain in right wrist: Secondary | ICD-10-CM | POA: Insufficient documentation

## 2014-08-29 DIAGNOSIS — E039 Hypothyroidism, unspecified: Secondary | ICD-10-CM | POA: Insufficient documentation

## 2014-08-29 DIAGNOSIS — G8929 Other chronic pain: Secondary | ICD-10-CM | POA: Insufficient documentation

## 2014-08-29 DIAGNOSIS — Z803 Family history of malignant neoplasm of breast: Secondary | ICD-10-CM | POA: Insufficient documentation

## 2014-08-29 DIAGNOSIS — M810 Age-related osteoporosis without current pathological fracture: Secondary | ICD-10-CM | POA: Diagnosis not present

## 2014-08-29 DIAGNOSIS — F0390 Unspecified dementia without behavioral disturbance: Secondary | ICD-10-CM | POA: Insufficient documentation

## 2014-08-29 DIAGNOSIS — R52 Pain, unspecified: Secondary | ICD-10-CM

## 2014-08-29 DIAGNOSIS — R410 Disorientation, unspecified: Secondary | ICD-10-CM | POA: Diagnosis present

## 2014-08-29 DIAGNOSIS — I10 Essential (primary) hypertension: Secondary | ICD-10-CM | POA: Insufficient documentation

## 2014-08-29 DIAGNOSIS — R509 Fever, unspecified: Secondary | ICD-10-CM

## 2014-08-29 DIAGNOSIS — G9340 Encephalopathy, unspecified: Secondary | ICD-10-CM | POA: Diagnosis not present

## 2014-08-29 DIAGNOSIS — D649 Anemia, unspecified: Secondary | ICD-10-CM | POA: Diagnosis present

## 2014-08-29 DIAGNOSIS — E43 Unspecified severe protein-calorie malnutrition: Secondary | ICD-10-CM | POA: Insufficient documentation

## 2014-08-29 DIAGNOSIS — I480 Paroxysmal atrial fibrillation: Secondary | ICD-10-CM | POA: Diagnosis present

## 2014-08-29 LAB — URINALYSIS, ROUTINE W REFLEX MICROSCOPIC
BILIRUBIN URINE: NEGATIVE
GLUCOSE, UA: NEGATIVE mg/dL
Ketones, ur: NEGATIVE mg/dL
Leukocytes, UA: NEGATIVE
Nitrite: NEGATIVE
Protein, ur: NEGATIVE mg/dL
Specific Gravity, Urine: 1.01 (ref 1.005–1.030)
UROBILINOGEN UA: 0.2 mg/dL (ref 0.0–1.0)
pH: 6.5 (ref 5.0–8.0)

## 2014-08-29 LAB — CBC WITH DIFFERENTIAL/PLATELET
Basophils Absolute: 0 10*3/uL (ref 0.0–0.1)
Basophils Relative: 0 % (ref 0–1)
Eosinophils Absolute: 0.1 10*3/uL (ref 0.0–0.7)
Eosinophils Relative: 1 % (ref 0–5)
HCT: 32.7 % — ABNORMAL LOW (ref 36.0–46.0)
Hemoglobin: 10.2 g/dL — ABNORMAL LOW (ref 12.0–15.0)
LYMPHS PCT: 12 % (ref 12–46)
Lymphs Abs: 0.9 10*3/uL (ref 0.7–4.0)
MCH: 23.3 pg — ABNORMAL LOW (ref 26.0–34.0)
MCHC: 31.2 g/dL (ref 30.0–36.0)
MCV: 74.8 fL — ABNORMAL LOW (ref 78.0–100.0)
Monocytes Absolute: 1.1 10*3/uL — ABNORMAL HIGH (ref 0.1–1.0)
Monocytes Relative: 14 % — ABNORMAL HIGH (ref 3–12)
NEUTROS PCT: 73 % (ref 43–77)
Neutro Abs: 5.6 10*3/uL (ref 1.7–7.7)
PLATELETS: 341 10*3/uL (ref 150–400)
RBC: 4.37 MIL/uL (ref 3.87–5.11)
RDW: 19.1 % — ABNORMAL HIGH (ref 11.5–15.5)
WBC: 7.7 10*3/uL (ref 4.0–10.5)

## 2014-08-29 LAB — COMPREHENSIVE METABOLIC PANEL
ALBUMIN: 3 g/dL — AB (ref 3.5–5.2)
ALT: 5 U/L (ref 0–35)
AST: 10 U/L (ref 0–37)
Alkaline Phosphatase: 108 U/L (ref 39–117)
Anion gap: 13 (ref 5–15)
BILIRUBIN TOTAL: 0.4 mg/dL (ref 0.3–1.2)
BUN: 16 mg/dL (ref 6–23)
CO2: 25 meq/L (ref 19–32)
Calcium: 9.4 mg/dL (ref 8.4–10.5)
Chloride: 100 mEq/L (ref 96–112)
Creatinine, Ser: 0.63 mg/dL (ref 0.50–1.10)
GFR calc Af Amer: 87 mL/min — ABNORMAL LOW (ref >90)
GFR, EST NON AFRICAN AMERICAN: 75 mL/min — AB (ref >90)
Glucose, Bld: 139 mg/dL — ABNORMAL HIGH (ref 70–99)
Potassium: 3.4 mEq/L — ABNORMAL LOW (ref 3.7–5.3)
SODIUM: 138 meq/L (ref 137–147)
Total Protein: 6.5 g/dL (ref 6.0–8.3)

## 2014-08-29 LAB — RAPID URINE DRUG SCREEN, HOSP PERFORMED
AMPHETAMINES: NOT DETECTED
Barbiturates: NOT DETECTED
Benzodiazepines: NOT DETECTED
Cocaine: NOT DETECTED
Opiates: POSITIVE — AB
TETRAHYDROCANNABINOL: NOT DETECTED

## 2014-08-29 LAB — I-STAT CG4 LACTIC ACID, ED: LACTIC ACID, VENOUS: 0.95 mmol/L (ref 0.5–2.2)

## 2014-08-29 LAB — TSH: TSH: 1.24 u[IU]/mL (ref 0.350–4.500)

## 2014-08-29 LAB — URINE MICROSCOPIC-ADD ON

## 2014-08-29 LAB — I-STAT TROPONIN, ED: Troponin i, poc: 0.01 ng/mL (ref 0.00–0.08)

## 2014-08-29 MED ORDER — CARVEDILOL 3.125 MG PO TABS
3.1250 mg | ORAL_TABLET | Freq: Two times a day (BID) | ORAL | Status: DC
  Administered 2014-08-30 – 2014-08-31 (×4): 3.125 mg via ORAL
  Filled 2014-08-29 (×5): qty 1

## 2014-08-29 MED ORDER — QUETIAPINE 12.5 MG HALF TABLET
12.5000 mg | ORAL_TABLET | Freq: Every day | ORAL | Status: DC
  Administered 2014-08-30: 12.5 mg via ORAL
  Filled 2014-08-29 (×2): qty 1

## 2014-08-29 MED ORDER — VANCOMYCIN HCL IN DEXTROSE 750-5 MG/150ML-% IV SOLN
750.0000 mg | INTRAVENOUS | Status: DC
  Administered 2014-08-29: 750 mg via INTRAVENOUS
  Filled 2014-08-29 (×2): qty 150

## 2014-08-29 MED ORDER — HEPARIN SODIUM (PORCINE) 5000 UNIT/ML IJ SOLN
5000.0000 [IU] | Freq: Three times a day (TID) | INTRAMUSCULAR | Status: DC
  Administered 2014-08-29 – 2014-08-31 (×6): 5000 [IU] via SUBCUTANEOUS
  Filled 2014-08-29 (×6): qty 1

## 2014-08-29 MED ORDER — LOSARTAN POTASSIUM 50 MG PO TABS
50.0000 mg | ORAL_TABLET | Freq: Every day | ORAL | Status: DC
  Administered 2014-08-30 – 2014-08-31 (×2): 50 mg via ORAL
  Filled 2014-08-29 (×3): qty 1

## 2014-08-29 MED ORDER — PIPERACILLIN-TAZOBACTAM 3.375 G IVPB
3.3750 g | Freq: Three times a day (TID) | INTRAVENOUS | Status: DC
  Administered 2014-08-29 – 2014-08-30 (×2): 3.375 g via INTRAVENOUS
  Filled 2014-08-29 (×4): qty 50

## 2014-08-29 MED ORDER — ACETAMINOPHEN 650 MG RE SUPP
650.0000 mg | Freq: Once | RECTAL | Status: AC
  Administered 2014-08-29: 650 mg via RECTAL
  Filled 2014-08-29: qty 1

## 2014-08-29 MED ORDER — SODIUM CHLORIDE 0.9 % IV BOLUS (SEPSIS)
500.0000 mL | Freq: Once | INTRAVENOUS | Status: AC
  Administered 2014-08-29: 500 mL via INTRAVENOUS

## 2014-08-29 MED ORDER — LEVOTHYROXINE SODIUM 50 MCG PO TABS
50.0000 ug | ORAL_TABLET | ORAL | Status: DC
  Administered 2014-08-30: 50 ug via ORAL
  Filled 2014-08-29 (×2): qty 1

## 2014-08-29 MED ORDER — LORAZEPAM 0.5 MG PO TABS
0.5000 mg | ORAL_TABLET | Freq: Two times a day (BID) | ORAL | Status: DC | PRN
  Administered 2014-08-29 – 2014-08-30 (×2): 0.5 mg via ORAL
  Filled 2014-08-29 (×2): qty 1

## 2014-08-29 MED ORDER — ONDANSETRON HCL 4 MG/2ML IJ SOLN: 4.0000 mg | Freq: Four times a day (QID) | INTRAMUSCULAR | Status: DC | PRN

## 2014-08-29 MED ORDER — ACETAMINOPHEN 650 MG RE SUPP
650.0000 mg | Freq: Four times a day (QID) | RECTAL | Status: DC | PRN
Start: 2014-08-29 — End: 2014-08-31

## 2014-08-29 MED ORDER — MEGESTROL ACETATE 400 MG/10ML PO SUSP
400.0000 mg | Freq: Every day | ORAL | Status: DC
Start: 2014-08-29 — End: 2014-08-31
  Administered 2014-08-29 – 2014-08-31 (×3): 400 mg via ORAL
  Filled 2014-08-29 (×3): qty 10

## 2014-08-29 MED ORDER — DEXTROSE 5 % IV SOLN: 500.0000 mg | INTRAVENOUS | Status: DC

## 2014-08-29 MED ORDER — FENTANYL CITRATE 0.05 MG/ML IJ SOLN
50.0000 ug | Freq: Once | INTRAMUSCULAR | Status: AC
  Administered 2014-08-29: 50 ug via INTRAVENOUS
  Filled 2014-08-29: qty 2

## 2014-08-29 MED ORDER — ACETAMINOPHEN 325 MG PO TABS
650.0000 mg | ORAL_TABLET | Freq: Four times a day (QID) | ORAL | Status: DC | PRN
  Administered 2014-08-30: 650 mg via ORAL
  Filled 2014-08-29: qty 2

## 2014-08-29 MED ORDER — ONDANSETRON HCL 4 MG PO TABS: 4.0000 mg | ORAL_TABLET | Freq: Four times a day (QID) | ORAL | Status: DC | PRN

## 2014-08-29 NOTE — Consult Note (Signed)
ANTIBIOTIC CONSULT NOTE - INITIAL  Pharmacy Consult for Vancomycin and Zosyn Indication: rule out sepsis  Allergies  Allergen Reactions  . Aspirin Other (See Comments)    Nose bleeds.   . Tramadol     Per daughter "makes her crazy"    Patient Measurements: Height: 5' 4.96" (165 cm) Weight: 105 lb 9.6 oz (47.9 kg) IBW/kg (Calculated) : 56.91  Vital Signs: Temp: 99.6 F (37.6 C) (11/17 1613) Temp Source: Rectal (11/17 1613) BP: 160/66 mmHg (11/17 1600) Pulse Rate: 84 (11/17 1600) Intake/Output from previous day:   Intake/Output from this shift:    Labs:  Recent Labs  08/29/14 1538  WBC 7.7  HGB 10.2*  PLT 341  CREATININE 0.63   Estimated Creatinine Clearance: 33.2 mL/min (by C-G formula based on Cr of 0.63).  Microbiology: No results found for this or any previous visit (from the past 720 hour(s)).  Medical History: Past Medical History  Diagnosis Date  . Thyroid disease   . Hypertension   . Cannot hear   . Vision loss   . Dementia    Assessment: 89yof presents to the ED with fever and altered mental status. Recently completed treatment for a UTI with augmentin. She will now begin vancomycin and zosyn for possible sepsis. Renal function wnl for her age.  Goal of Therapy:  Vancomycin trough 15-20  Plan:  1) Vancomycin 750mg  IV q24 2) Zosyn 3.375g IV q8 (4 hour infusion) 3) Follow renal function, cultures, LOT, level if needed  Fredrik RiggerMarkle, Mandeep Ferch Sue 08/29/2014,5:33 PM

## 2014-08-29 NOTE — H&P (Addendum)
[facility] History and Physical  Patient: [name]  ZOX:[id number]  DOB: 1919/01/11  DOS: the patient was seen and examined on 08/29/2014 PCP: Charlette Caffey, MD  Chief Complaint: confusion  HPI: [name] is a 80 y.o. female with Past medical history of Dementia, hypertension, UTI, hypothyroidism, vision loss . The patient presented with fever and confusion. The history was obtained from patient's daughter as the patient was unable to provide any history due to her dementia and confusion. Patient apparently was trying to get out of the bed on Thursday and had a fall but did not hit her head or neck. Since then she has been having progressively worsening confusion and was more agitated. There is no change in her medication and she has not been having any nausea vomiting or diarrhea or complaining of burning when she was urinating. The patient was unable to identify her daughter,and since last 2 days has not been eating and drinking enough. She also appears to have significant weight loss. Patient started complaining of swelling and pain on the right wrist with redness and swelling. No recent change in her medications reported.  The patient is coming from home. And at her baseline dependent for most of her ADL.  Review of Systems: as mentioned in the history of present illness.  A Comprehensive review of the other systems is negative.  Past Medical History  Diagnosis Date  . Thyroid disease   . Hypertension   . Cannot hear   . Vision loss   . Dementia    Past Surgical History  Procedure Laterality Date  . Cesarean section    . Abdominal hysterectomy    . Kyphoplasty    . Intramedullary (im) nail intertrochanteric Right 06/13/2014    Procedure: Affix Nail/Right;  Surgeon: Jodi Marble, MD;  Location: [facility] OR;  Service: Orthopedics;  Laterality: Right;   Social History:  reports that she has never smoked. She does not have any smokeless tobacco history on file.  She reports that she does not drink alcohol or use illicit drugs.  Allergies  Allergen Reactions  . Aspirin Other (See Comments)    Nose bleeds.   . Tramadol     Per daughter "makes her crazy"    Family History  Problem Relation Age of Onset  . Breast cancer Sister   . Cancer Brother   . Liver disease Son   . Lung cancer Son     Prior to Admission medications   Medication Sig Start Date End Date Taking? Authorizing Provider  carvedilol (COREG) 3.125 MG tablet Take 1 tablet (3.125 mg total) by mouth 2 (two) times daily with a meal. 06/15/14  Yes Marinda Elk, MD  levothyroxine (SYNTHROID, LEVOTHROID) 50 MCG tablet Take 50 mcg by mouth every morning.    Yes Historical Provider, MD  LORazepam (ATIVAN) 0.5 MG tablet Take one tablet by mouth twice daily as needed for agitation Patient taking differently: Take 0.5 mg by mouth 3 (three) times daily. Take one tablet by mouth twice daily as needed for agitation 07/10/14  Yes Tiffany L Reed, DO  losartan (COZAAR) 50 MG tablet Take 50 mg by mouth every morning.    Yes Historical Provider, MD  QUEtiapine (SEROQUEL) 25 MG tablet Take 0.5 tablets (12.5 mg total) by mouth at bedtime. 07/04/14  Yes Esperanza Sheets, MD  traZODone (DESYREL) 100 MG tablet Take 100 mg by mouth at bedtime as needed for sleep.   Yes Historical Provider, MD  albuterol (PROVENTIL) (2.5 MG/3ML) 0.083% nebulizer solution Take 3 mLs (2.5 mg total) by nebulization every 2 (two) hours as needed for wheezing. Patient not taking: Reported on 08/29/2014 07/04/14   Esperanza SheetsUlugbek N Buriev, MD  amoxicillin-clavulanate (AUGMENTIN) 875-125 MG per tablet Take 1 tablet by mouth 6 (six) times daily. Patient not taking: Reported on 08/29/2014 07/04/14   Esperanza SheetsUlugbek N Buriev, MD  cholecalciferol (VITAMIN D) 1000 UNITS tablet Take 3,000 Units by mouth daily.    Historical Provider, MD  ferrous sulfate 325 (65 FE) MG tablet Take 1 tablet (325 mg total) by mouth 2 (two) times daily with a meal. Patient  not taking: Reported on 08/29/2014 07/04/14   Esperanza SheetsUlugbek N Buriev, MD  oxyCODONE-acetaminophen (PERCOCET/ROXICET) 5-325 MG per tablet Take 1 tablet by mouth every 12 (twelve) hours as needed for severe pain. Patient not taking: Reported on 08/29/2014 07/04/14   Esperanza SheetsUlugbek N Buriev, MD  polyethylene glycol (MIRALAX / GLYCOLAX) packet Take 17 g by mouth daily. Patient not taking: Reported on 08/29/2014 06/15/14   Marinda ElkAbraham Feliz Ortiz, MD    Physical Exam: Filed Vitals:   08/29/14 1730 08/29/14 1800 08/29/14 1900 08/29/14 2102  BP: 150/62 135/57 155/76 145/64  Pulse: 79 76 80 72  Temp:    97.6 F (36.4 C)  TempSrc:    Oral  Resp: 23 17 16 18   Height:      Weight:      SpO2: 93% 95% 95% 93%    General: Alert, Awake and not oriented Time, Place and Person. Appear in mild distress Eyes: PERRL ENT: Oral Mucosa clear moist. Neck: no JVD Cardiovascular: S1 and S2 Present, aortic systolic Murmur, Peripheral Pulses Present Respiratory: Bilateral Air entry equal and Decreased, Clear to Auscultation, noCrackles, no wheezes Abdomen: Bowel Sound present, Soft and non tender Skin: no Rash Extremities: upper extremity right wrapped, no Pedal edema, no calf tenderness Neurologic: spontaneously moving all extremities, not following command.apparently at her baseline she follows command and carries out a conversation.  Labs on Admission:  CBC:  Recent Labs Lab 08/29/14 1538  WBC 7.7  NEUTROABS 5.6  HGB 10.2*  HCT 32.7*  MCV 74.8*  PLT 341    CMP     Component Value Date/Time   NA 138 08/29/2014 1538   K 3.4* 08/29/2014 1538   CL 100 08/29/2014 1538   CO2 25 08/29/2014 1538   GLUCOSE 139* 08/29/2014 1538   BUN 16 08/29/2014 1538   CREATININE 0.63 08/29/2014 1538   CALCIUM 9.4 08/29/2014 1538   PROT 6.5 08/29/2014 1538   ALBUMIN 3.0* 08/29/2014 1538   AST 10 08/29/2014 1538   ALT 5 08/29/2014 1538   ALKPHOS 108 08/29/2014 1538   BILITOT 0.4 08/29/2014 1538   GFRNONAA 75* 08/29/2014 1538    GFRAA 87* 08/29/2014 1538    No results for input(s): LIPASE, AMYLASE in the last 168 hours. No results for input(s): AMMONIA in the last 168 hours.  No results for input(s): CKTOTAL, CKMB, CKMBINDEX, TROPONINI in the last 168 hours. BNP (last 3 results) No results for input(s): PROBNP in the last 8760 hours.  Radiological Exams on Admission: Dg Pelvis 1-2 Views  08/29/2014   CLINICAL DATA:  Recent fall with hip pain, initial encounter  EXAM: PELVIS - 1-2 VIEW  COMPARISON:  None.  FINDINGS: Postsurgical changes are noted in the proximal right femur. Chronic changes are noted in the pubic rami bilaterally. No acute fracture is identified. The known sacral fractures are not well appreciated on this exam.  No soft tissue abnormality is seen.  IMPRESSION: Chronic changes without acute abnormality.   Electronically Signed   By: Alcide CleverMark  Lukens M.D.   On: 08/29/2014 17:54   Dg Wrist Complete Right  08/29/2014   CLINICAL DATA:  RIGHT wrist pain and deformity post fall  EXAM: RIGHT WRIST - COMPLETE 3+ VIEW  COMPARISON:  None  FINDINGS: Diffuse osseous demineralization.  Ulnar plus variance with sclerosis of the portion of the lunate adjacent to the distal ulna suggesting ulnolunatal impaction.  Deformity of the distal radius with volar tilt of the distal radial articular surface, though no definite acute fracture plane is identified, question prior/remote distal radial metaphyseal fracture with resultant deformity.  Degenerative changes at distal radioulnar joint.  Diffuse soft tissue swelling at wrist.  Small amount of amorphous soft tissue calcification adjacent to distal ulna, nonspecific.  Chondrocalcinosis at TFCC.  No definite acute fracture, dislocation or bone destruction.  IMPRESSION: Deformity of distal radius with volar tilt of distal radial articular surface but without acute fracture plane evident, question sequela of remote distal radial metaphyseal fracture.  Secondary ulnar plus variance with  ulnolunatal impaction and degenerative changes of the distal radioulnar joint.  Osseous demineralization and chondrocalcinosis question CPPD.  Soft tissue swelling without definite acute fracture or dislocation.   Electronically Signed   By: Ulyses SouthwardMark  Boles M.D.   On: 08/29/2014 17:33   Ct Head Wo Contrast  08/29/2014   CLINICAL DATA:  Confusion, 6 days duration. Questionable history of previous fall.  EXAM: CT HEAD WITHOUT CONTRAST  CT CERVICAL SPINE WITHOUT CONTRAST  TECHNIQUE: Multidetector CT imaging of the head and cervical spine was performed following the standard protocol without intravenous contrast. Multiplanar CT image reconstructions of the cervical spine were also generated.  COMPARISON:  06/30/2014  FINDINGS: CT HEAD FINDINGS  The brain shows generalized atrophy. There is chronic small vessel change throughout the cerebral hemispheric white matter. There is old focal infarction in the right basal ganglia/ external capsule region. No cortical or large vessel territory infarction. No mass lesion, hemorrhage, hydrocephalus or extra-axial collection. Sinuses are clear. No calvarial abnormality. There is atherosclerotic calcification of the major vessels at the base of the brain.  CT CERVICAL SPINE FINDINGS  There is moderate motion degradation. No traumatic malalignment. No fracture. No soft tissue swelling. There is ordinary mild degenerative spondylosis at C2-3, C3-4, C4-5, C5-6 and C6-7. No significant stenosis of the canal or foramina.  IMPRESSION:  Cervical spine CT: Chronic degenerative changes. No acute or traumatic finding.  Head CT: No acute finding. Generalized brain atrophy and extensive chronic small vessel ischemic changes.   Electronically Signed   By: Paulina FusiMark  Shogry M.D.   On: 08/29/2014 16:57   Ct Cervical Spine Wo Contrast  08/29/2014   CLINICAL DATA:  Confusion, 6 days duration. Questionable history of previous fall.  EXAM: CT HEAD WITHOUT CONTRAST  CT CERVICAL SPINE WITHOUT CONTRAST   TECHNIQUE: Multidetector CT imaging of the head and cervical spine was performed following the standard protocol without intravenous contrast. Multiplanar CT image reconstructions of the cervical spine were also generated.  COMPARISON:  06/30/2014  FINDINGS: CT HEAD FINDINGS  The brain shows generalized atrophy. There is chronic small vessel change throughout the cerebral hemispheric white matter. There is old focal infarction in the right basal ganglia/ external capsule region. No cortical or large vessel territory infarction. No mass lesion, hemorrhage, hydrocephalus or extra-axial collection. Sinuses are clear. No calvarial abnormality. There is atherosclerotic calcification of the major vessels at  the base of the brain.  CT CERVICAL SPINE FINDINGS  There is moderate motion degradation. No traumatic malalignment. No fracture. No soft tissue swelling. There is ordinary mild degenerative spondylosis at C2-3, C3-4, C4-5, C5-6 and C6-7. No significant stenosis of the canal or foramina.  IMPRESSION:  Cervical spine CT: Chronic degenerative changes. No acute or traumatic finding.  Head CT: No acute finding. Generalized brain atrophy and extensive chronic small vessel ischemic changes.   Electronically Signed   By: Paulina Fusi M.D.   On: 08/29/2014 16:57   Dg Chest Port 1 View  08/29/2014   CLINICAL DATA:  Productive cough, fever and shortness of Breath.  EXAM: PORTABLE CHEST - 1 VIEW  COMPARISON:  06/30/2014.  FINDINGS: The heart is upper limits of normal in size and stable given the AP portable technique and supine position of the patient. The mediastinal contours are prominent. This is partly due to a tortuous ectatic aorta and the rotation the patient along with the AP projection and supine position. The lungs are clear of acute process. No definite pleural effusions or pneumothorax. Bilateral healed rib fractures are noted. Multilevel vertebral augmentation changes are noted.  IMPRESSION: Cardiac enlargement  and prominent mediastinal contours likely due to the rotation of the patient and the AP projection and supine position.  No definite acute pulmonary findings.  Multiple healed rib fractures.   Electronically Signed   By: Loralie Champagne M.D.   On: 08/29/2014 16:01   EKG: Independently reviewed. normal sinus rhythm, nonspecific ST and T waves changes.  Assessment/Plan Principal Problem:   Acute encephalopathy Active Problems:   Hypothyroidism   Osteoporosis   Hypertension   Anemia   Paroxysmal a-fib   Fever   1. Acute encephalopathy The patient is presenting with complains of fever and confusion. No signs of meningitis suspected. CT of the head, C-spine, x-ray of the chest, recent wrist does not show any evidence of acute abnormality. Patient has been evaluated by the hand surgeon who does not feel that the patient has ongoing septic arthritis. Urine does not show any evidence of infection. Lactic acid is normal troponin is normal. Urine drug screen does not show any abnormal finding. Patient has been on opioids since her recent discharge. With this possible etiology of current presentation is acute worsening of dementia with delirium, possible undiagnosed UTI, possible cellulitis of the hand. Currently she is receiving IV vancomycin and Zosyn due to her recent hospitalization. At present I would continue both and in the car workup comes back negative then we can safely discontinue them. Monitor her on telemetry and obtain serial neuro checks. If there is no improvement patient's medical condition may require an MRI.  2.hypothyroidism. TSH and free T4 within normal limits. Continue Synthroid.  3.essential hypertension. Continuing home medications.  4.chronic pain. At presentholding her opioids and only continuing with lorazepam as needed for any anxiety or agitation.  Advance goals of care discussion: DNR/DNI  DVT Prophylaxis: subcutaneous Heparin Nutrition: advance as  tolerated  Family Communication: discussed with daughter on the phone, opportunity was given to ask question and all questions were answered satisfactorily at the time of interview. Disposition: Admitted to observation in telemetry  unit.  Author: Lynden Oxford, MD [facility] Pager: [phone] 08/29/2014, 11:12 PM    If 7PM-7AM, please contact night-coverage [email or web address] Password [email or web address]

## 2014-08-29 NOTE — ED Notes (Signed)
To room via EMS.  Daughter and sitter at bedside.  Pt crying out in pain.  Onset Thursday daughter has noticed pt is confused, crying out and not usual self.  Normally not confused.  Crying out has gotten worse today.   Treated recently for UTI.  Was Wednesday for recheck, cleared of UTI.  Pt has swollen right wrist and right ankle.  Large bruise to anterior right shin and small bruise to left ant leg.

## 2014-08-29 NOTE — ED Notes (Signed)
Dr Gramig at bedside. 

## 2014-08-29 NOTE — ED Provider Notes (Signed)
CSN: [id number]     Arrival date & time 08/29/14  1443 History   First MD Initiated Contact with Patient 08/29/14 1444     Chief Complaint  Patient presents with  . Fever  . Altered Mental Status     (Consider location/radiation/quality/duration/timing/severity/associated sxs/prior Treatment) HPI [name] is a 80 y.o. female History of dementia, hypertension, recent admission for acute encephalopathy due to UTI. History provided by family. Patient will currently lives at home with 24-hour caregiver. It is unclear if patient had a recent fall, however patient did say that she may have fallen out of bed. Patient with decreased altered mental status over the last week. Also complaining of right wrist pain and generalized pain. Family stated that since discharge patient had another UTI, had an appointment last Wednesday with PCP for recheck of the urine, at that time was normal. Patient was not aware of there was any fever. There is no nausea or vomiting, no diarrhea. Patient has had some cough.  Past Medical History  Diagnosis Date  . Thyroid disease   . Hypertension   . Cannot hear   . Vision loss   . Dementia    Past Surgical History  Procedure Laterality Date  . Cesarean section    . Abdominal hysterectomy    . Kyphoplasty    . Intramedullary (im) nail intertrochanteric Right 06/13/2014    Procedure: Affix Nail/Right;  Surgeon: Jodi Marbleavid A Thompson, MD;  [facility];  Service: Orthopedics;  Laterality: Right;   Family History  Problem Relation Age of Onset  . Breast cancer Sister   . Cancer Brother   . Liver disease Son   . Lung cancer Son    History  Substance Use Topics  . Smoking status: Never Smoker   . Smokeless tobacco: Not on file  . Alcohol Use: No   OB History    No data available     Review of Systems  Unable to perform ROS: Mental status change      Allergies  Aspirin and Tramadol  Home Medications   Prior to Admission medications   Medication  Sig Start Date End Date Taking? Authorizing Provider  albuterol (PROVENTIL) (2.5 MG/3ML) 0.083% nebulizer solution Take 3 mLs (2.5 mg total) by nebulization every 2 (two) hours as needed for wheezing. 07/04/14   Esperanza SheetsUlugbek N Buriev, MD  amoxicillin-clavulanate (AUGMENTIN) 875-125 MG per tablet Take 1 tablet by mouth 6 (six) times daily. 07/04/14   Esperanza SheetsUlugbek N Buriev, MD  carvedilol (COREG) 3.125 MG tablet Take 1 tablet (3.125 mg total) by mouth 2 (two) times daily with a meal. 06/15/14   Marinda ElkAbraham Feliz Ortiz, MD  cholecalciferol (VITAMIN D) 1000 UNITS tablet Take 3,000 Units by mouth daily.    Historical Provider, MD  ferrous sulfate 325 (65 FE) MG tablet Take 1 tablet (325 mg total) by mouth 2 (two) times daily with a meal. 07/04/14   Esperanza SheetsUlugbek N Buriev, MD  levothyroxine (SYNTHROID, LEVOTHROID) 50 MCG tablet Take 50 mcg by mouth every morning.     Historical Provider, MD  LORazepam (ATIVAN) 0.5 MG tablet Take one tablet by mouth twice daily as needed for agitation 07/10/14   Tiffany L Reed, DO  losartan (COZAAR) 50 MG tablet Take 50 mg by mouth every morning.     Historical Provider, MD  oxyCODONE-acetaminophen (PERCOCET/ROXICET) 5-325 MG per tablet Take 1 tablet by mouth every 12 (twelve) hours as needed for severe pain. 07/04/14   Esperanza SheetsUlugbek N Buriev, MD  polyethylene glycol (  MIRALAX / GLYCOLAX) packet Take 17 g by mouth daily. 06/15/14   Marinda ElkAbraham Feliz Ortiz, MD  QUEtiapine (SEROQUEL) 25 MG tablet Take 0.5 tablets (12.5 mg total) by mouth at bedtime. 07/04/14   Esperanza SheetsUlugbek N Buriev, MD   BP 170/74 mmHg  Pulse 89  Temp(Src) 101.4 F (38.6 C) (Rectal)  Resp 20  SpO2 95% Physical Exam  Constitutional: She is oriented to person, place, and time. She appears well-developed and well-nourished.  Ill appearing, moaning  HENT:  Head: Normocephalic.  Mouth/Throat: Oropharynx is clear and moist.  Eyes: Conjunctivae and EOM are normal. Pupils are equal, round, and reactive to light.  Neck: Normal range of motion. Neck  supple.  Cardiovascular: Normal rate and regular rhythm.   Murmur heard. Pulmonary/Chest: Effort normal and breath sounds normal. No respiratory distress. She has no wheezes. She has no rales.  Abdominal: Soft. Bowel sounds are normal. She exhibits no distension. There is no tenderness. There is no rebound.  Musculoskeletal: She exhibits no edema.  Swelling noted to the right wrist. Tender to palpation. Pain with any ROM. Full rom of bilateral elbows, shoulders, hips, knees.   Neurological: She is alert and oriented to person, place, and time.  Skin: Skin is warm and dry.  Psychiatric: She has a normal mood and affect. Her behavior is normal.  Nursing note and vitals reviewed.   ED Course  Procedures (including critical care time) Labs Review Labs Reviewed  CULTURE, BLOOD (ROUTINE X 2)  CULTURE, BLOOD (ROUTINE X 2)  URINE CULTURE  CBC WITH DIFFERENTIAL  COMPREHENSIVE METABOLIC PANEL  URINALYSIS, ROUTINE W REFLEX MICROSCOPIC  I-STAT TROPOININ, ED  I-STAT CG4 LACTIC ACID, ED    Imaging Review No results found.   EKG Interpretation None      MDM   Final diagnoses:  Pain  Fall    Patient is here with worsening  mental status, fever, pain in the right wrist. Time vital signs are normal except for the elevated temperature. Question sepsis. Will start IV fluids, lactic acid, labs, blood cultures ordered. Urinalysis ordered. We'll get x-rays of the wrist, pelvis, CT of the head and cervical spine given recent possible fall. Will monitor.  Patient's labs unremarkable, waiting on x-rays. We'll signed out to Dr. Rubin PayorPickering at shift change.       Desiree Musselatyana A Karan Inclan, PA-C 08/30/14 1700  Rolland PorterMark James, MD 09/06/14 1354

## 2014-08-29 NOTE — Consult Note (Signed)
Reason for Consult:swelling right wrist Referring Physician: ER staff  [name] is an 80 y.o. female.  HPI: 80 year old female who has multiple problems. She presents for my evaluation of her wrist. Her daughter is with her. The patient is blind and unfortunately legally deaf. Her vision issue is due to progressive macular degeneration over the last two-year's.her loss of hearing occurred after a skull fracture 12 years ago when she fell in the street.  She is difficult to communicate with. Her daughters very helpful. She's had a 20 pound weight loss over the last few weeks. She does not keep much. She has a history of pelvic trauma attended 2 through [facility]. She has been 24-hour care in the care of her daughter at her daughter's home.  There is no obvious trauma that her daughter notes but the patient states she slipped all the oh she is difficult to speak with and cannot give me all the details.  In any event she has a swollen painful wrist it appears.  She's been noted to have a systemic temperature today. There is concern over possible infection trauma or other etiology and thus I was asked to see her.  Family denies history of gout pseudogout or other issue.  Past Medical History  Diagnosis Date  . Thyroid disease   . Hypertension   . Cannot hear   . Vision loss   . Dementia     Past Surgical History  Procedure Laterality Date  . Cesarean section    . Abdominal hysterectomy    . Kyphoplasty    . Intramedullary (im) nail intertrochanteric Right 06/13/2014    Procedure: Affix Nail/Right;  Surgeon: Jolyn Nap, MD;  Location: [facility];  Service: Orthopedics;  Laterality: Right;    Family History  Problem Relation Age of Onset  . Breast cancer Sister   . Cancer Brother   . Liver disease Son   . Lung cancer Son     Social History:  reports that she has never smoked. She does not have any smokeless tobacco history on file. She reports that she does not drink  alcohol or use illicit drugs.  Allergies:  Allergies  Allergen Reactions  . Aspirin Other (See Comments)    Nose bleeds.   . Tramadol     Per daughter "makes her crazy"    Medications: I have reviewed the patient's current medications.  Results for orders placed or performed during the hospital encounter of 08/29/14 (from the past 48 hour(s))  CBC WITH DIFFERENTIAL     Status: Abnormal   Collection Time: 08/29/14  3:38 PM  Result Value Ref Range   WBC 7.7 4.0 - 10.5 K/uL   RBC 4.37 3.87 - 5.11 MIL/uL   Hemoglobin 10.2 (L) 12.0 - 15.0 g/dL   HCT 32.7 (L) 36.0 - 46.0 %   MCV 74.8 (L) 78.0 - 100.0 fL   MCH 23.3 (L) 26.0 - 34.0 pg   MCHC 31.2 30.0 - 36.0 g/dL   RDW 19.1 (H) 11.5 - 15.5 %   Platelets 341 150 - 400 K/uL   Neutrophils Relative % 73 43 - 77 %   Neutro Abs 5.6 1.7 - 7.7 K/uL   Lymphocytes Relative 12 12 - 46 %   Lymphs Abs 0.9 0.7 - 4.0 K/uL   Monocytes Relative 14 (H) 3 - 12 %   Monocytes Absolute 1.1 (H) 0.1 - 1.0 K/uL   Eosinophils Relative 1 0 - 5 %   Eosinophils  Absolute 0.1 0.0 - 0.7 K/uL   Basophils Relative 0 0 - 1 %   Basophils Absolute 0.0 0.0 - 0.1 K/uL  Comprehensive metabolic panel     Status: Abnormal   Collection Time: 08/29/14  3:38 PM  Result Value Ref Range   Sodium 138 137 - 147 mEq/L   Potassium 3.4 (L) 3.7 - 5.3 mEq/L   Chloride 100 96 - 112 mEq/L   CO2 25 19 - 32 mEq/L   Glucose, Bld 139 (H) 70 - 99 mg/dL   BUN 16 6 - 23 mg/dL   Creatinine, Ser 0.63 0.50 - 1.10 mg/dL   Calcium 9.4 8.4 - 10.5 mg/dL   Total Protein 6.5 6.0 - 8.3 g/dL   Albumin 3.0 (L) 3.5 - 5.2 g/dL   AST 10 0 - 37 U/L   ALT 5 0 - 35 U/L   Alkaline Phosphatase 108 39 - 117 U/L   Total Bilirubin 0.4 0.3 - 1.2 mg/dL   GFR calc non Af Amer 75 (L) >90 mL/min   GFR calc Af Amer 87 (L) >90 mL/min    Comment: (NOTE) The eGFR has been calculated using the CKD EPI equation. This calculation has not been validated in all clinical situations. eGFR's persistently <90 mL/min  signify possible Chronic Kidney Disease.    Anion gap 13 5 - 15  I-Stat Troponin, ED (not at [facility])     Status: None   Collection Time: 08/29/14  3:50 PM  Result Value Ref Range   Troponin i, poc 0.01 0.00 - 0.08 ng/mL   Comment 3            Comment: Due to the release kinetics of cTnI, a negative result within the first hours of the onset of symptoms does not rule out myocardial infarction with certainty. If myocardial infarction is still suspected, repeat the test at appropriate intervals.   I-Stat CG4 Lactic Acid, ED     Status: None   Collection Time: 08/29/14  3:52 PM  Result Value Ref Range   Lactic Acid, Venous 0.95 0.5 - 2.2 mmol/L  Urinalysis with microscopic     Status: Abnormal   Collection Time: 08/29/14  4:10 PM  Result Value Ref Range   Color, Urine YELLOW YELLOW   APPearance CLEAR CLEAR   Specific Gravity, Urine 1.010 1.005 - 1.030   pH 6.5 5.0 - 8.0   Glucose, UA NEGATIVE NEGATIVE mg/dL   Hgb urine dipstick TRACE (A) NEGATIVE   Bilirubin Urine NEGATIVE NEGATIVE   Ketones, ur NEGATIVE NEGATIVE mg/dL   Protein, ur NEGATIVE NEGATIVE mg/dL   Urobilinogen, UA 0.2 0.0 - 1.0 mg/dL   Nitrite NEGATIVE NEGATIVE   Leukocytes, UA NEGATIVE NEGATIVE  Urine microscopic-add on     Status: None   Collection Time: 08/29/14  4:10 PM  Result Value Ref Range   Squamous Epithelial / LPF RARE RARE   RBC / HPF 3-6 <3 RBC/hpf   Bacteria, UA RARE RARE   Urine-Other MUCOUS PRESENT     Dg Pelvis 1-2 Views  08/29/2014   CLINICAL DATA:  Recent fall with hip pain, initial encounter  EXAM: PELVIS - 1-2 VIEW  COMPARISON:  None.  FINDINGS: Postsurgical changes are noted in the proximal right femur. Chronic changes are noted in the pubic rami bilaterally. No acute fracture is identified. The known sacral fractures are not well appreciated on this exam. No soft tissue abnormality is seen.  IMPRESSION: Chronic changes without acute abnormality.   Electronically Signed  By: Inez Catalina M.D.    On: 08/29/2014 17:54   Dg Wrist Complete Right  08/29/2014   CLINICAL DATA:  RIGHT wrist pain and deformity post fall  EXAM: RIGHT WRIST - COMPLETE 3+ VIEW  COMPARISON:  None  FINDINGS: Diffuse osseous demineralization.  Ulnar plus variance with sclerosis of the portion of the lunate adjacent to the distal ulna suggesting ulnolunatal impaction.  Deformity of the distal radius with volar tilt of the distal radial articular surface, though no definite acute fracture plane is identified, question prior/remote distal radial metaphyseal fracture with resultant deformity.  Degenerative changes at distal radioulnar joint.  Diffuse soft tissue swelling at wrist.  Small amount of amorphous soft tissue calcification adjacent to distal ulna, nonspecific.  Chondrocalcinosis at TFCC.  No definite acute fracture, dislocation or bone destruction.  IMPRESSION: Deformity of distal radius with volar tilt of distal radial articular surface but without acute fracture plane evident, question sequela of remote distal radial metaphyseal fracture.  Secondary ulnar plus variance with ulnolunatal impaction and degenerative changes of the distal radioulnar joint.  Osseous demineralization and chondrocalcinosis question CPPD.  Soft tissue swelling without definite acute fracture or dislocation.   Electronically Signed   By: Lavonia Dana M.D.   On: 08/29/2014 17:33   Ct Head Wo Contrast  08/29/2014   CLINICAL DATA:  Confusion, 6 days duration. Questionable history of previous fall.  EXAM: CT HEAD WITHOUT CONTRAST  CT CERVICAL SPINE WITHOUT CONTRAST  TECHNIQUE: Multidetector CT imaging of the head and cervical spine was performed following the standard protocol without intravenous contrast. Multiplanar CT image reconstructions of the cervical spine were also generated.  COMPARISON:  06/30/2014  FINDINGS: CT HEAD FINDINGS  The brain shows generalized atrophy. There is chronic small vessel change throughout the cerebral hemispheric white  matter. There is old focal infarction in the right basal ganglia/ external capsule region. No cortical or large vessel territory infarction. No mass lesion, hemorrhage, hydrocephalus or extra-axial collection. Sinuses are clear. No calvarial abnormality. There is atherosclerotic calcification of the major vessels at the base of the brain.  CT CERVICAL SPINE FINDINGS  There is moderate motion degradation. No traumatic malalignment. No fracture. No soft tissue swelling. There is ordinary mild degenerative spondylosis at C2-3, C3-4, C4-5, C5-6 and C6-7. No significant stenosis of the canal or foramina.  IMPRESSION:  Cervical spine CT: Chronic degenerative changes. No acute or traumatic finding.  Head CT: No acute finding. Generalized brain atrophy and extensive chronic small vessel ischemic changes.   Electronically Signed   By: Nelson Chimes M.D.   On: 08/29/2014 16:57   Ct Cervical Spine Wo Contrast  08/29/2014   CLINICAL DATA:  Confusion, 6 days duration. Questionable history of previous fall.  EXAM: CT HEAD WITHOUT CONTRAST  CT CERVICAL SPINE WITHOUT CONTRAST  TECHNIQUE: Multidetector CT imaging of the head and cervical spine was performed following the standard protocol without intravenous contrast. Multiplanar CT image reconstructions of the cervical spine were also generated.  COMPARISON:  06/30/2014  FINDINGS: CT HEAD FINDINGS  The brain shows generalized atrophy. There is chronic small vessel change throughout the cerebral hemispheric white matter. There is old focal infarction in the right basal ganglia/ external capsule region. No cortical or large vessel territory infarction. No mass lesion, hemorrhage, hydrocephalus or extra-axial collection. Sinuses are clear. No calvarial abnormality. There is atherosclerotic calcification of the major vessels at the base of the brain.  CT CERVICAL SPINE FINDINGS  There is moderate motion degradation. No traumatic malalignment.  No fracture. No soft tissue swelling.  There is ordinary mild degenerative spondylosis at C2-3, C3-4, C4-5, C5-6 and C6-7. No significant stenosis of the canal or foramina.  IMPRESSION:  Cervical spine CT: Chronic degenerative changes. No acute or traumatic finding.  Head CT: No acute finding. Generalized brain atrophy and extensive chronic small vessel ischemic changes.   Electronically Signed   By: Nelson Chimes M.D.   On: 08/29/2014 16:57   Dg Chest Port 1 View  08/29/2014   CLINICAL DATA:  Productive cough, fever and shortness of Breath.  EXAM: PORTABLE CHEST - 1 VIEW  COMPARISON:  06/30/2014.  FINDINGS: The heart is upper limits of normal in size and stable given the AP portable technique and supine position of the patient. The mediastinal contours are prominent. This is partly due to a tortuous ectatic aorta and the rotation the patient along with the AP projection and supine position. The lungs are clear of acute process. No definite pleural effusions or pneumothorax. Bilateral healed rib fractures are noted. Multilevel vertebral augmentation changes are noted.  IMPRESSION: Cardiac enlargement and prominent mediastinal contours likely due to the rotation of the patient and the AP projection and supine position.  No definite acute pulmonary findings.  Multiple healed rib fractures.   Electronically Signed   By: Kalman Jewels M.D.   On: 08/29/2014 16:01    Review of Systems  HENT:       Loss of hearing  loss of vision  Respiratory: Negative for cough and hemoptysis.   Cardiovascular: Negative for chest pain.  Gastrointestinal: Negative for nausea and vomiting.       Recent weight loss over the last 2 weeks   Blood pressure 135/57, pulse 76, temperature 99.6 F (37.6 C), temperature source Rectal, resp. rate 17, height 5' 4.96" (1.65 m), weight 47.9 kg (105 lb 9.6 oz), SpO2 95 %. Physical Exam   She is pleasant female who is difficult to communicate with. She is had swelling in her right wrist noted over the last 12 hours or so.  The patient's right wrist is examined in detail. The right wrist is stable. Fingers are without signs of trauma or compartment syndrome or infection. There is no erythema compartment syndrome or signs of cellulitic change about her arm. I do not see any signs of infection. Her wrist is swollen and tender. This would be significant for a traumatic injury versus a arthritic flare given the fact that her x-rays are notable for arthritis  She has intact pulse and intact sensation it appears. Elbow and shoulder examination bilaterally is nontender. The a patient does not exhibit a signs of obvious infection. She does not exhibit signs of compartment syndrome. The patient does not exhibit any obvious acute neck pain. Lower extremity examination shows no tenderness with knee hip and ankle range of motion passively she has normal pulse. Is difficult for her to follow my commands. She breathes normally without a wheeze or shortness of breath.   X-rays reviewed by myself show dramatic at MCP and distal radial ulnar joint arthritic change. The x-rays to be significant for advanced arthrosis  Assessment/Plan: Swelling right wrist cannot rule out hairline fracture given her severe osteopenia and a questionable history of trauma. This could also represent a arthritic exacerbation given her tremendous arthritic change on exam. I would treat both the same way with  Immobilization and observation.  I performed a short arm cast placement today without difficulty*this well. Prior to placing the cast I'll cleanse trauma  soap and water. She does not have any evidence of infection in my opinion based upon her skin exam and other parameters. I pointed this out to the daughter.   We will watch her and monitor her condition. I do not feel a CT or MRI scan is warranted at this juncture as it would likely not change our treatment direction.  Other issues to consider our the weight loss and her general dilapidation in  function.  She recently saw her primary care MD in [place] but we do not have these notes available.  If the patient is discharged to have my card for follow-up. Otherwise I'll be monitoring her condition in the hospital during her stay  Paulene Floor 08/29/2014, 7:42 PM

## 2014-08-29 NOTE — Progress Notes (Signed)
Report received from ED nurse.

## 2014-08-29 NOTE — ED Notes (Signed)
[phone] Artist PaisSonya Tomlinson- daughter.

## 2014-08-30 DIAGNOSIS — E43 Unspecified severe protein-calorie malnutrition: Secondary | ICD-10-CM | POA: Insufficient documentation

## 2014-08-30 DIAGNOSIS — R509 Fever, unspecified: Secondary | ICD-10-CM

## 2014-08-30 DIAGNOSIS — G9340 Encephalopathy, unspecified: Principal | ICD-10-CM

## 2014-08-30 DIAGNOSIS — D509 Iron deficiency anemia, unspecified: Secondary | ICD-10-CM

## 2014-08-30 DIAGNOSIS — I480 Paroxysmal atrial fibrillation: Secondary | ICD-10-CM

## 2014-08-30 DIAGNOSIS — W19XXXA Unspecified fall, initial encounter: Secondary | ICD-10-CM | POA: Insufficient documentation

## 2014-08-30 DIAGNOSIS — E039 Hypothyroidism, unspecified: Secondary | ICD-10-CM

## 2014-08-30 DIAGNOSIS — W19XXXD Unspecified fall, subsequent encounter: Secondary | ICD-10-CM

## 2014-08-30 LAB — RETICULOCYTES
RBC.: 4.37 MIL/uL (ref 3.87–5.11)
Retic Count, Absolute: 43.7 10*3/uL (ref 19.0–186.0)
Retic Ct Pct: 1 % (ref 0.4–3.1)

## 2014-08-30 LAB — URINE CULTURE
Colony Count: NO GROWTH
Culture: NO GROWTH

## 2014-08-30 LAB — CBC WITH DIFFERENTIAL/PLATELET
Basophils Absolute: 0 10*3/uL (ref 0.0–0.1)
Basophils Relative: 1 % (ref 0–1)
EOS PCT: 7 % — AB (ref 0–5)
Eosinophils Absolute: 0.6 10*3/uL (ref 0.0–0.7)
HEMATOCRIT: 30.7 % — AB (ref 36.0–46.0)
HEMOGLOBIN: 9.5 g/dL — AB (ref 12.0–15.0)
LYMPHS ABS: 1 10*3/uL (ref 0.7–4.0)
Lymphocytes Relative: 13 % (ref 12–46)
MCH: 22.8 pg — ABNORMAL LOW (ref 26.0–34.0)
MCHC: 30.9 g/dL (ref 30.0–36.0)
MCV: 73.6 fL — AB (ref 78.0–100.0)
MONO ABS: 0.9 10*3/uL (ref 0.1–1.0)
Monocytes Relative: 11 % (ref 3–12)
Neutro Abs: 5.6 10*3/uL (ref 1.7–7.7)
Neutrophils Relative %: 68 % (ref 43–77)
Platelets: 346 10*3/uL (ref 150–400)
RBC: 4.17 MIL/uL (ref 3.87–5.11)
RDW: 19 % — ABNORMAL HIGH (ref 11.5–15.5)
WBC: 8.2 10*3/uL (ref 4.0–10.5)

## 2014-08-30 LAB — INFLUENZA PANEL BY PCR (TYPE A & B)
H1N1 flu by pcr: NOT DETECTED
Influenza A By PCR: NEGATIVE
Influenza B By PCR: NEGATIVE

## 2014-08-30 LAB — PROTIME-INR
INR: 1.28 (ref 0.00–1.49)
Prothrombin Time: 16.2 seconds — ABNORMAL HIGH (ref 11.6–15.2)

## 2014-08-30 LAB — IRON AND TIBC
Iron: <10 ug/dL — ABNORMAL LOW (ref 42–135)
UIBC: 151 ug/dL (ref 125–400)

## 2014-08-30 LAB — COMPREHENSIVE METABOLIC PANEL
ALT: 5 U/L (ref 0–35)
ANION GAP: 11 (ref 5–15)
AST: 10 U/L (ref 0–37)
Albumin: 2.6 g/dL — ABNORMAL LOW (ref 3.5–5.2)
Alkaline Phosphatase: 95 U/L (ref 39–117)
BUN: 12 mg/dL (ref 6–23)
CALCIUM: 9.2 mg/dL (ref 8.4–10.5)
CO2: 25 meq/L (ref 19–32)
CREATININE: 0.58 mg/dL (ref 0.50–1.10)
Chloride: 101 mEq/L (ref 96–112)
GFR, EST AFRICAN AMERICAN: 89 mL/min — AB (ref >90)
GFR, EST NON AFRICAN AMERICAN: 77 mL/min — AB (ref >90)
Glucose, Bld: 112 mg/dL — ABNORMAL HIGH (ref 70–99)
Potassium: 2.9 mEq/L — CL (ref 3.7–5.3)
Sodium: 137 mEq/L (ref 137–147)
Total Bilirubin: 0.6 mg/dL (ref 0.3–1.2)
Total Protein: 5.9 g/dL — ABNORMAL LOW (ref 6.0–8.3)

## 2014-08-30 LAB — MAGNESIUM: Magnesium: 1.9 mg/dL (ref 1.5–2.5)

## 2014-08-30 LAB — T4, FREE: Free T4: 1.24 ng/dL (ref 0.80–1.80)

## 2014-08-30 MED ORDER — CETYLPYRIDINIUM CHLORIDE 0.05 % MT LIQD
7.0000 mL | Freq: Two times a day (BID) | OROMUCOSAL | Status: DC
  Administered 2014-08-31: 7 mL via OROMUCOSAL

## 2014-08-30 MED ORDER — DOXYCYCLINE HYCLATE 100 MG PO TABS
100.0000 mg | ORAL_TABLET | Freq: Two times a day (BID) | ORAL | Status: DC
  Administered 2014-08-30 – 2014-08-31 (×3): 100 mg via ORAL
  Filled 2014-08-30 (×4): qty 1

## 2014-08-30 MED ORDER — FERROUS SULFATE 325 (65 FE) MG PO TABS
325.0000 mg | ORAL_TABLET | Freq: Two times a day (BID) | ORAL | Status: DC
  Administered 2014-08-30 – 2014-08-31 (×3): 325 mg via ORAL
  Filled 2014-08-30 (×4): qty 1

## 2014-08-30 MED ORDER — HALOPERIDOL LACTATE 5 MG/ML IJ SOLN
1.0000 mg | Freq: Three times a day (TID) | INTRAMUSCULAR | Status: DC | PRN
  Administered 2014-08-30 – 2014-08-31 (×2): 1 mg via INTRAMUSCULAR
  Filled 2014-08-30 (×2): qty 1

## 2014-08-30 MED ORDER — LORAZEPAM 1 MG PO TABS: 1.0000 mg | ORAL_TABLET | Freq: Once | ORAL | Status: DC

## 2014-08-30 MED ORDER — LORAZEPAM 0.5 MG PO TABS
0.5000 mg | ORAL_TABLET | Freq: Two times a day (BID) | ORAL | Status: DC | PRN
  Administered 2014-08-30 – 2014-08-31 (×2): 0.5 mg via ORAL
  Filled 2014-08-30 (×2): qty 1

## 2014-08-30 MED ORDER — LEVOTHYROXINE SODIUM 50 MCG PO TABS
50.0000 ug | ORAL_TABLET | ORAL | Status: DC
  Administered 2014-08-31: 50 ug via ORAL
  Filled 2014-08-30 (×2): qty 1

## 2014-08-30 MED ORDER — POTASSIUM CHLORIDE CRYS ER 20 MEQ PO TBCR
40.0000 meq | EXTENDED_RELEASE_TABLET | ORAL | Status: AC
  Administered 2014-08-30 (×3): 40 meq via ORAL
  Filled 2014-08-30 (×3): qty 2

## 2014-08-30 MED ORDER — ENSURE COMPLETE PO LIQD
237.0000 mL | Freq: Two times a day (BID) | ORAL | Status: DC
Start: 2014-08-30 — End: 2014-08-31
  Administered 2014-08-30 – 2014-08-31 (×3): 237 mL via ORAL

## 2014-08-30 NOTE — Progress Notes (Signed)
Orthopedic Tech Progress Note Patient Details:  Desiree HatchetCealia M Stevens 16-Jun-1919 [id number] Assessed fiberglass short arm cast.  Pt.'s arm was not being supported properly by cast and pt.'s thumb had recessed into cast.  Cast was very loose.  Removed cast and placed arm in Velcro wrist splint.  Pulses, sensation, motion intact before and after application.  Capillary refill less than 2 seconds before and after application. Ortho Devices Type of Ortho Device: Velcro wrist splint Ortho Device/Splint Location: RUE Ortho Device/Splint Interventions: Application   [name], [name] 08/30/2014, 10:00 PM

## 2014-08-30 NOTE — Progress Notes (Signed)
[facility] PROGRESS NOTE  PIERCE BIAGINI ZOX:[id number] DOB: 06-Apr-1919 DOA: 08/29/2014 PCP: Charlette Caffey, MD  Assessment/Plan:  Acute encephalopathy -unknown etiology, has baseline dementia. Afebrile, normal WBC, no identified source of infection -CT head/Cspine, X rays of chest/wrist/pelvis- all with no acute abnormalities.  UA-neg -Will discontinue IV Zosyn and Vancomycin (day 1)  -Placed on Doxycycline (day 1) for narrower coverage -PT/OT- home health PT -Will check B12 -Urine and blood cxs pending   Right wrist swelling -Wrist Xray- soft tissue swelling without fracture or dislocation -Appreciate Orthopaedic consult- right wrist with possible hairline fracture vs arthritic changes. Immobilized wrist with short arm cast, will continue to observe -OP FU with Othopaedic  Hypokalemia -K 2.9 -Replete with oral KDUR 40 meq x3 doses -Magnesium- normal -Repeat BMET in am  Microcytic Anemia -Hgb 9.5- no signs or hx of overt bleeding -Anemia panel with low ferritin -Will add iron tablets BID with meals -Recheck CBC in am  Hypothyroidism -TSH and free T4  -Continue Synthroid 50 mcg daily  Essential HTN -Continue Coreg 3.125 BID, Losartan 50mg  daily   Depression -Continue Seroquel 12.5 QHS  Anxiety -Ativan 0.5mg  BID PRN   DVT Prophylaxis:  SQ heparin Code Status: DNR Family Communication: Spoke with daughter Elenora Gamma, [phone], and updated her on mother's status. Daughter states mother  lives at her own house with a 24/7 caregiver.  She is concerned about mother's worsening dementia and is looking at placing her in a SNF. Disposition Plan: Inpatient   Consultants:  None  Procedures:  None  Antibiotics:  IV Vancomycin (11/17-18)  IV zosyn (11/17-18)  Doxycycline (11/18>>>  HPI/Subjective: KEANDRA MEDERO is a 80 yo female with PMH of dementia, HTN, recent UTI, hypothyroidism, paroxysmal a fib (not on anticoagulation due to unknown  source of anemia), depression, hearing loss, vision loss, and hx fracture of right hip (ORIF 9/15), that presents with fever and confusion. Patient is unable to provide history due to dementia, thus history obtained from daughter.  Pt was recently treated by her PCP for a UTI, and completed a 10 day course of antibiotics.  Additionally, pt is finishing up with PT as she is s/p ORIF 9/15. Pt over the last week, with more confusion, unaware of her surroundings, and doesn't want to eat or drink.  Then Monday, as pt was getting out of bed, rolled to the floor, but did not fall, and ended up landing on her right wrist.  The next day wrist is markedly swollen and red, and pt develops a fever. The daughter claims mother has been loosing weight. She denies any nausea, vomiting, bdominal pain, changes to bowels, or change in medications. Pt lives at her house with 24/7 caregiver. Workup in ED with CT head/ spine, Xray of chest/wrist/hip are all with no acute abnormalities.  Pt is admitted for further workup.    Pt is confused, agitated, and is doesn't know who or where she is.  She keeps pulling at short arm cast, but does not complain of pain.    Objective: Filed Vitals:   08/30/14 0452  BP: 158/68  Pulse: 69  Temp: 97.9 F (36.6 C)  Resp: 18    Intake/Output Summary (Last 24 hours) at 08/30/14 1151 Last data filed at 08/30/14 1024  Gross per 24 hour  Intake    950 ml  Output      0 ml  Net    950 ml   Filed Weights   08/29/14 1700 08/30/14 0453  Weight:  47.9 kg (105 lb 9.6 oz) 47.5 kg (104 lb 11.5 oz)    Exam:  Gen: cachectic looking Caucasian female in NAD HEENT: Normocephalic, atraumatic.  Pupils symmertrical.  Dry mucosa.   Chest: clear to auscultate bilaterally, no ronchi or rales  Cardiac: Regular rate and rhythm, S1-S2, no rubs murmurs or gallops  Abdomen: soft, non tender, non distended, +bowel sounds. No guarding or rigidity  Extremities: Symmetrical in appearance without  cyanosis or edema.  Right wrist in short arm cast.  Neurological: Alert only to self. Psychiatric: Unable to assess due to dementia  Data Reviewed: Basic Metabolic Panel:  Recent Labs Lab 08/29/14 1538 08/30/14 0705  NA 138 137  K 3.4* 2.9*  CL 100 101  CO2 25 25  GLUCOSE 139* 112*  BUN 16 12  CREATININE 0.63 0.58  CALCIUM 9.4 9.2  MG  --  1.9   Liver Function Tests:  Recent Labs Lab 08/29/14 1538 08/30/14 0705  AST 10 10  ALT 5 5  ALKPHOS 108 95  BILITOT 0.4 0.6  PROT 6.5 5.9*  ALBUMIN 3.0* 2.6*   No results for input(s): LIPASE, AMYLASE in the last 168 hours. No results for input(s): AMMONIA in the last 168 hours. CBC:  Recent Labs Lab 08/29/14 1538 08/30/14 0705  WBC 7.7 8.2  NEUTROABS 5.6 5.6  HGB 10.2* 9.5*  HCT 32.7* 30.7*  MCV 74.8* 73.6*  PLT 341 346   Cardiac Enzymes: No results for input(s): CKTOTAL, CKMB, CKMBINDEX, TROPONINI in the last 168 hours. BNP (last 3 results) No results for input(s): PROBNP in the last 8760 hours. CBG: No results for input(s): GLUCAP in the last 168 hours.  Recent Results (from the past 240 hour(s))  Culture, blood (routine x 2)     Status: None (Preliminary result)   Collection Time: 08/29/14  3:38 PM  Result Value Ref Range Status   Specimen Description BLOOD LEFT ARM  Final   Special Requests BOTTLES DRAWN AEROBIC AND ANAEROBIC 5CC  Final   Culture  Setup Time   Final    08/29/2014 23:19 Performed at [facility]    Culture   Final           BLOOD CULTURE RECEIVED NO GROWTH TO DATE CULTURE WILL BE HELD FOR 5 DAYS BEFORE ISSUING A FINAL NEGATIVE REPORT Performed at [facility]    Report Status PENDING  Incomplete  Culture, blood (routine x 2)     Status: None (Preliminary result)   Collection Time: 08/29/14  3:45 PM  Result Value Ref Range Status   Specimen Description BLOOD LEFT THIGH  Final   Special Requests BOTTLES DRAWN AEROBIC AND ANAEROBIC 5CC  Final   Culture  Setup Time    Final    08/29/2014 23:20 Performed at [facility]    Culture   Final           BLOOD CULTURE RECEIVED NO GROWTH TO DATE CULTURE WILL BE HELD FOR 5 DAYS BEFORE ISSUING A FINAL NEGATIVE REPORT Performed at [facility]    Report Status PENDING  Incomplete     Studies: Dg Pelvis 1-2 Views  08/29/2014   CLINICAL DATA:  Recent fall with hip pain, initial encounter  EXAM: PELVIS - 1-2 VIEW  COMPARISON:  None.  FINDINGS: Postsurgical changes are noted in the proximal right femur. Chronic changes are noted in the pubic rami bilaterally. No acute fracture is identified. The known sacral fractures are not well appreciated on this exam.  No soft tissue abnormality is seen.  IMPRESSION: Chronic changes without acute abnormality.   Electronically Signed   By: Alcide CleverMark  Lukens M.D.   On: 08/29/2014 17:54   Dg Wrist Complete Right  08/29/2014   CLINICAL DATA:  RIGHT wrist pain and deformity post fall  EXAM: RIGHT WRIST - COMPLETE 3+ VIEW  COMPARISON:  None  FINDINGS: Diffuse osseous demineralization.  Ulnar plus variance with sclerosis of the portion of the lunate adjacent to the distal ulna suggesting ulnolunatal impaction.  Deformity of the distal radius with volar tilt of the distal radial articular surface, though no definite acute fracture plane is identified, question prior/remote distal radial metaphyseal fracture with resultant deformity.  Degenerative changes at distal radioulnar joint.  Diffuse soft tissue swelling at wrist.  Small amount of amorphous soft tissue calcification adjacent to distal ulna, nonspecific.  Chondrocalcinosis at TFCC.  No definite acute fracture, dislocation or bone destruction.  IMPRESSION: Deformity of distal radius with volar tilt of distal radial articular surface but without acute fracture plane evident, question sequela of remote distal radial metaphyseal fracture.  Secondary ulnar plus variance with ulnolunatal impaction and degenerative changes of the distal  radioulnar joint.  Osseous demineralization and chondrocalcinosis question CPPD.  Soft tissue swelling without definite acute fracture or dislocation.   Electronically Signed   By: Ulyses SouthwardMark  Boles M.D.   On: 08/29/2014 17:33   Ct Head Wo Contrast  08/29/2014   CLINICAL DATA:  Confusion, 6 days duration. Questionable history of previous fall.  EXAM: CT HEAD WITHOUT CONTRAST  CT CERVICAL SPINE WITHOUT CONTRAST  TECHNIQUE: Multidetector CT imaging of the head and cervical spine was performed following the standard protocol without intravenous contrast. Multiplanar CT image reconstructions of the cervical spine were also generated.  COMPARISON:  06/30/2014  FINDINGS: CT HEAD FINDINGS  The brain shows generalized atrophy. There is chronic small vessel change throughout the cerebral hemispheric white matter. There is old focal infarction in the right basal ganglia/ external capsule region. No cortical or large vessel territory infarction. No mass lesion, hemorrhage, hydrocephalus or extra-axial collection. Sinuses are clear. No calvarial abnormality. There is atherosclerotic calcification of the major vessels at the base of the brain.  CT CERVICAL SPINE FINDINGS  There is moderate motion degradation. No traumatic malalignment. No fracture. No soft tissue swelling. There is ordinary mild degenerative spondylosis at C2-3, C3-4, C4-5, C5-6 and C6-7. No significant stenosis of the canal or foramina.  IMPRESSION:  Cervical spine CT: Chronic degenerative changes. No acute or traumatic finding.  Head CT: No acute finding. Generalized brain atrophy and extensive chronic small vessel ischemic changes.   Electronically Signed   By: Paulina FusiMark  Shogry M.D.   On: 08/29/2014 16:57   Ct Cervical Spine Wo Contrast  08/29/2014   CLINICAL DATA:  Confusion, 6 days duration. Questionable history of previous fall.  EXAM: CT HEAD WITHOUT CONTRAST  CT CERVICAL SPINE WITHOUT CONTRAST  TECHNIQUE: Multidetector CT imaging of the head and cervical  spine was performed following the standard protocol without intravenous contrast. Multiplanar CT image reconstructions of the cervical spine were also generated.  COMPARISON:  06/30/2014  FINDINGS: CT HEAD FINDINGS  The brain shows generalized atrophy. There is chronic small vessel change throughout the cerebral hemispheric white matter. There is old focal infarction in the right basal ganglia/ external capsule region. No cortical or large vessel territory infarction. No mass lesion, hemorrhage, hydrocephalus or extra-axial collection. Sinuses are clear. No calvarial abnormality. There is atherosclerotic calcification of the major vessels at  the base of the brain.  CT CERVICAL SPINE FINDINGS  There is moderate motion degradation. No traumatic malalignment. No fracture. No soft tissue swelling. There is ordinary mild degenerative spondylosis at C2-3, C3-4, C4-5, C5-6 and C6-7. No significant stenosis of the canal or foramina.  IMPRESSION:  Cervical spine CT: Chronic degenerative changes. No acute or traumatic finding.  Head CT: No acute finding. Generalized brain atrophy and extensive chronic small vessel ischemic changes.   Electronically Signed   By: Paulina Fusi M.D.   On: 08/29/2014 16:57   Dg Chest Port 1 View  08/29/2014   CLINICAL DATA:  Productive cough, fever and shortness of Breath.  EXAM: PORTABLE CHEST - 1 VIEW  COMPARISON:  06/30/2014.  FINDINGS: The heart is upper limits of normal in size and stable given the AP portable technique and supine position of the patient. The mediastinal contours are prominent. This is partly due to a tortuous ectatic aorta and the rotation the patient along with the AP projection and supine position. The lungs are clear of acute process. No definite pleural effusions or pneumothorax. Bilateral healed rib fractures are noted. Multilevel vertebral augmentation changes are noted.  IMPRESSION: Cardiac enlargement and prominent mediastinal contours likely due to the rotation  of the patient and the AP projection and supine position.  No definite acute pulmonary findings.  Multiple healed rib fractures.   Electronically Signed   By: Loralie Champagne M.D.   On: 08/29/2014 16:01    Scheduled Meds: . [START ON 08/31/2014] antiseptic oral rinse  7 mL Mouth Rinse BID  . carvedilol  3.125 mg Oral BID WC  . doxycycline  100 mg Oral Q12H  . heparin  5,000 Units Subcutaneous 3 times per day  . levothyroxine  50 mcg Oral BH-q7a  . losartan  50 mg Oral QPC breakfast  . megestrol  400 mg Oral Daily  . potassium chloride  40 mEq Oral Q4H  . QUEtiapine  12.5 mg Oral QHS   Continuous Infusions:   Principal Problem:   Acute encephalopathy Active Problems:   Hypothyroidism   Osteoporosis   Hypertension   Anemia   Paroxysmal a-fib   Fever    Time spent: 18    Illa Level [facility]  [facility] Pager [phone]. If 7PM-7AM, please contact night-coverage at [email or web address], password [facility] 08/30/2014, 11:51 AM  LOS: 1 day

## 2014-08-30 NOTE — Plan of Care (Signed)
Problem: Phase I Progression Outcomes Goal: Pain controlled with appropriate interventions Outcome: Completed/Met Date Met:  09-Feb-2014

## 2014-08-30 NOTE — Plan of Care (Signed)
Problem: Discharge Progression Outcomes Goal: Activity appropriate for discharge plan Outcome: Completed/Met Date Met:  08/30/14

## 2014-08-30 NOTE — Progress Notes (Signed)
Attempted to see pt. Spoke with daughter on phone prior to session. Pt was fairly dependent with all adls PTA. She occasionally helped feed self with help from caregiver and would assist in brushing teeth.  Caregiver sponge baths pt, dresses her, assists with all toileting and pt never walks alone.  She spends 98% of time in w/c and walks with HHPT with min assist per daughter.  Due to pt being dependent in most self care, will sign off of OT at this time. Tory EmeraldHolly Jenavi Beedle, [place] [phone]

## 2014-08-30 NOTE — Evaluation (Signed)
Physical Therapy Evaluation Patient Details Name: [name] MRN: [id number] DOB: [birth year] Today's Date: 08/30/2014   History of Present Illness  [name] is a 80 y.o. female with Past medical history of Dementia, hypertension, UTI, hypothyroidism, vision loss and deafness .The patient presented with fever and confusion. Patient apparently was trying to get out of the bed on Thursday and had a fall but did not hit her head or neck. Since then she has been having progressively worsening confusion and was more agitated. Pt hospitalized 9/15 with bilateral acetabular fractures, right IM nail. Pt has right wrist pain and swelling, X-ray neg, being treated conservatively. AT baseline, pt dependent for most ADL's, lives with daughter.   Clinical Impression  Pt very agitated on PT eval, confused more with unfamiliar person, seems to be in her best interest to minimize the number of people in contact with her. She is at min A level, close to her baseline. Recommend she ambulate as tolerated with caregiver and return to HHPT in her home environment where she will hopefully be more comfortable and less confused. PT signing off.     Follow Up Recommendations Home health PT    Equipment Recommendations  None recommended by PT    Recommendations for Other Services       Precautions / Restrictions Precautions Precautions: Fall Precaution Comments: visual and hearing deficits Restrictions Weight Bearing Restrictions: No      Mobility  Bed Mobility               General bed mobility comments: pt received in chair  Transfers Overall transfer level: Needs assistance Equipment used: Rolling walker (2 wheeled) Transfers: Sit to/from Stand Sit to Stand: Min assist         General transfer comment: min A to steady but pt needing the tactile cue to get up, seems that if she had the desire to get up on her own, she had physical ability to do so. Guided back to chair after pt  escalated while ambulating in hall  Ambulation/Gait Ambulation/Gait assistance: Min assist Ambulation Distance (Feet): 200 Feet Assistive device: Rolling walker (2 wheeled) Gait Pattern/deviations: Step-through pattern;Trunk flexed Gait velocity: decreased   General Gait Details: pt looking for lobby and her w/c, reported feeling tired and our of breath but would not allow self to be helped to turn to go back to room, resisting all cues from therapist and escalating more and more as she continued to walk. Caregiver got chair from room and caregiver, therapist, and NT assisted pt to chair  Stairs            Wheelchair Mobility    Modified Rankin (Stroke Patients Only)       Balance Overall balance assessment: Needs assistance Sitting-balance support: No upper extremity supported;Feet supported Sitting balance-Leahy Scale: Fair     Standing balance support: Bilateral upper extremity supported;During functional activity Standing balance-Leahy Scale: Fair Standing balance comment: occasional LOB when right casted arm would fall of RW, tried to get pt to ambulate with HHA but she was adamant about using RW                             Pertinent Vitals/Pain Pain Assessment: No/denies pain    Home Living Family/patient expects to be discharged to:: Private residence Living Arrangements: Non-relatives/Friends Available Help at Discharge: Personal care attendant;Available 24 hours/day Type of Home: House Home Access: Stairs to enter Entrance Stairs-Rails: Left  Entrance Stairs-Number of Steps: 3 Home Layout: One level Home Equipment: Wheelchair - [employer] - 2 wheels;Toilet riser Additional Comments: pt has 24 hr caregivers, caregiver in room reports that until last week pt was ambulating 4-5x/ day but has not been doing that past week. Has apparently been receiving some HHPT.    Prior Function Level of Independence: Needs assistance   Gait / Transfers  Assistance Needed: caregiver reports supervision needed with RW  ADL's / Homemaking Assistance Needed: caregivers help her sponge bathe  Comments: pt agitated during eval, very confused about where she is, wanting w/c, does not want to get back in bed despite that no one was trying to get her to go back to bed, paranoid that therapist is against her     Hand Dominance   Dominant Hand: Right    Extremity/Trunk Assessment   Upper Extremity Assessment: RUE deficits/detail RUE Deficits / Details: cast on RUE         Lower Extremity Assessment: Generalized weakness;Difficult to assess due to impaired cognition      Cervical / Trunk Assessment: Kyphotic  Communication   Communication: HOH  Cognition Arousal/Alertness: Awake/alert Behavior During Therapy: Agitated Overall Cognitive Status: History of cognitive impairments - at baseline       Memory: Decreased short-term memory              General Comments General comments (skin integrity, edema, etc.): due to pt's mental status and need for only min A with ambulation, seems that pt more agitated with contact with more unfamiliar people. At this time, recommend pt ambulate with caregiver and no further acute PT. F/u with outpt in her home environment when she is hopefully less confused.    Exercises        Assessment/Plan    PT Assessment All further PT needs can be met in the next venue of care  PT Diagnosis Abnormality of gait   PT Problem List Decreased balance;Decreased mobility;Decreased cognition;Decreased coordination  PT Treatment Interventions     PT Goals (Current goals can be found in the Care Plan section) Acute Rehab PT Goals Patient Stated Goal: get her w/c and go downstairs PT Goal Formulation: All assessment and education complete, DC therapy    Frequency     Barriers to discharge        Co-evaluation               End of Session Equipment Utilized During Treatment:  (refused gait  belt) Activity Tolerance: Treatment limited secondary to agitation Patient left: in chair;with call bell/phone within reach;with family/visitor present Nurse Communication: Mobility status    Functional Assessment Tool Used: clinical judgement Functional Limitation: Mobility: Walking and moving around Mobility: Walking and Moving Around Current Status ([id number]): At least 1 percent but less than 20 percent impaired, limited or restricted Mobility: Walking and Moving Around Goal Status [phone]): At least 1 percent but less than 20 percent impaired, limited or restricted Mobility: Walking and Moving Around Discharge Status [phone]): At least 1 percent but less than 20 percent impaired, limited or restricted    Time: 1359-1420 PT Time Calculation (min) (ACUTE ONLY): 21 min   Charges:   PT Evaluation $Initial PT Evaluation Tier I: 1 Procedure PT Treatments $Gait Training: 8-22 mins   PT G Codes:   Functional Assessment Tool Used: clinical judgement Functional Limitation: Mobility: Walking and moving around  [place], [place]  [place], [place] 08/30/2014, 3:00 PM

## 2014-08-30 NOTE — Progress Notes (Signed)
Spoke with MD, pt. Ok not to have IV.

## 2014-08-30 NOTE — Progress Notes (Signed)
INITIAL NUTRITION ASSESSMENT  DOCUMENTATION CODES Per approved criteria  -Severe malnutrition in the context of chronic illness   Pt meets criteria for severe MALNUTRITION in the context of chronic illness as evidenced by moderate to severe fat and muscle depletion, 11.1% wt loss x 3 months.  INTERVENTION: -Ensure Complete po BID, each supplement provides 350 kcal and 13 grams of protein  NUTRITION DIAGNOSIS: Inadequate oral intake related to poor appetite as evidenced by PO: 10-25%, moderate to severe fat and muscle depletion, 11.1% wt loss x 3 months.   Goal: Pt will meet >90% of estimated nutritional needs  Monitor:  PO/supplement intake, labs, weight changes, I/O's  Reason for Assessment: MST=2, Consult for poor PO intake  80 y.o. female  Admitting Dx: Acute encephalopathy  [name] is a 80 yo female with PMH of dementia, HTN, recent UTI, hypothyroidism, paroxysmal a fib (not on anticoagulation due to unknown source of anemia), depression, hearing loss, vision loss, and hx fracture of right hip (ORIF 9/15), that presents with fever and confusion.  ASSESSMENT: Pt admitted with acute encephalopathy. Pt unable to participate in interview, due to dementia. Hx obtained by pt's home care aide, who was present at bedside.  She reports that she has been caring for pt for a little over a month. She reveals that pt had a good appetite up until one week ago. She confirms poor appetite over the past week. Meal completion reveals 10-25% of meals. Aide reports that pt's PCP recommended trying an nutrition supplement, such as Ensure, however, she reveals that pt daughter has not yet purchased for pt. Aide is agreeable to receiving supplements. Discussed importance of nutritional adequacy to support healing process.  Wt hx reveals a 11.1% wt loss over the past 3 months.  Labs reviewed. K: 2.9 (on supplement), Glucose: 112. Mg WDL.   Nutrition Focused Physical Exam:  Subcutaneous Fat:   Orbital Region: severe depletion Upper Arm Region: severe depletion Thoracic and Lumbar Region: severe depletion  Muscle:  Temple Region: severe depletion Clavicle Bone Region: severe depletion Clavicle and Acromion Bone Region: severe depletion Scapular Bone Region: severe depletion Dorsal Hand: severe depletion Patellar Region: moderate depletion Anterior Thigh Region: moderate depletion Posterior Calf Region: moderate depletion  Edema: none present  Height: Ht Readings from Last 1 Encounters:  08/29/14 5' 4.96" (1.65 m)    Weight: Wt Readings from Last 1 Encounters:  08/30/14 104 lb 11.5 oz (47.5 kg)    Ideal Body Weight: 125#  % Ideal Body Weight: 83%  Wt Readings from Last 10 Encounters:  08/30/14 104 lb 11.5 oz (47.5 kg)  07/28/14 105 lb 9.6 oz (47.9 kg)  07/04/14 103 lb 9.6 oz (46.993 kg)  06/11/14 117 lb (53.071 kg)  01/09/12 117 lb (53.071 kg)  12/21/11 117 lb (53.071 kg)    Usual Body Weight: 117#  % Usual Body Weight: 89%  BMI:  Body mass index is 17.45 kg/(m^2). Underweight  Estimated Nutritional Needs: Kcal: 1400-1600 Protein: 57-67 grams Fluid: 1.4-1.6 L  Skin: ecchymosis on rt arm and lt leg  Diet Order: Diet regular  EDUCATION NEEDS: -Education needs addressed   Intake/Output Summary (Last 24 hours) at 08/30/14 1436 Last data filed at 08/30/14 1323  Gross per 24 hour  Intake   1050 ml  Output      0 ml  Net   1050 ml    Last BM: PTA  Labs:   Recent Labs Lab 08/29/14 1538 08/30/14 0705  NA 138 137  K  3.4* 2.9*  CL 100 101  CO2 25 25  BUN 16 12  CREATININE 0.63 0.58  CALCIUM 9.4 9.2  MG  --  1.9  GLUCOSE 139* 112*    CBG (last 3)  No results for input(s): GLUCAP in the last 72 hours.  Scheduled Meds: . [START ON 08/31/2014] antiseptic oral rinse  7 mL Mouth Rinse BID  . carvedilol  3.125 mg Oral BID WC  . doxycycline  100 mg Oral Q12H  . ferrous sulfate  325 mg Oral BID WC  . heparin  5,000 Units  Subcutaneous 3 times per day  . levothyroxine  50 mcg Oral BH-q7a  . losartan  50 mg Oral QPC breakfast  . megestrol  400 mg Oral Daily  . potassium chloride  40 mEq Oral Q4H  . QUEtiapine  12.5 mg Oral QHS    Continuous Infusions:   Past Medical History  Diagnosis Date  . Thyroid disease   . Hypertension   . Cannot hear   . Vision loss   . Dementia     Past Surgical History  Procedure Laterality Date  . Cesarean section    . Abdominal hysterectomy    . Kyphoplasty    . Intramedullary (im) nail intertrochanteric Right 06/13/2014    Procedure: Affix Nail/Right;  Surgeon: Jodi Marbleavid A Thompson, MD;  [facility];  Service: Orthopedics;  Laterality: Right;   Honorio Devol A. Mayford KnifeWilliams, RD, LDN Pager: [phone] After hours Pager: [phone]

## 2014-08-30 NOTE — Progress Notes (Signed)
UR completed 

## 2014-08-31 DIAGNOSIS — G9340 Encephalopathy, unspecified: Secondary | ICD-10-CM | POA: Diagnosis not present

## 2014-08-31 DIAGNOSIS — E43 Unspecified severe protein-calorie malnutrition: Secondary | ICD-10-CM

## 2014-08-31 DIAGNOSIS — I10 Essential (primary) hypertension: Secondary | ICD-10-CM

## 2014-08-31 LAB — CBC
HCT: 29.1 % — ABNORMAL LOW (ref 36.0–46.0)
Hemoglobin: 9 g/dL — ABNORMAL LOW (ref 12.0–15.0)
MCH: 22.4 pg — ABNORMAL LOW (ref 26.0–34.0)
MCHC: 30.9 g/dL (ref 30.0–36.0)
MCV: 72.4 fL — AB (ref 78.0–100.0)
PLATELETS: 340 10*3/uL (ref 150–400)
RBC: 4.02 MIL/uL (ref 3.87–5.11)
RDW: 18.9 % — AB (ref 11.5–15.5)
WBC: 5.6 10*3/uL (ref 4.0–10.5)

## 2014-08-31 LAB — FERRITIN: FERRITIN: 326 ng/mL — AB (ref 10–291)

## 2014-08-31 LAB — BASIC METABOLIC PANEL
ANION GAP: 12 (ref 5–15)
BUN: 18 mg/dL (ref 6–23)
CO2: 24 mEq/L (ref 19–32)
Calcium: 9.6 mg/dL (ref 8.4–10.5)
Chloride: 105 mEq/L (ref 96–112)
Creatinine, Ser: 0.51 mg/dL (ref 0.50–1.10)
GFR, EST NON AFRICAN AMERICAN: 80 mL/min — AB (ref >90)
Glucose, Bld: 102 mg/dL — ABNORMAL HIGH (ref 70–99)
POTASSIUM: 4.1 meq/L (ref 3.7–5.3)
SODIUM: 141 meq/L (ref 137–147)

## 2014-08-31 LAB — VITAMIN B12: Vitamin B-12: 405 pg/mL (ref 211–911)

## 2014-08-31 LAB — FOLATE: Folate: >20 ng/mL

## 2014-08-31 MED ORDER — CARVEDILOL 6.25 MG PO TABS: 6.2500 mg | ORAL_TABLET | Freq: Two times a day (BID) | ORAL | Status: AC

## 2014-08-31 MED ORDER — DOXYCYCLINE HYCLATE 50 MG PO CAPS: 100.0000 mg | ORAL_CAPSULE | Freq: Two times a day (BID) | ORAL | Status: AC

## 2014-08-31 MED ORDER — ENSURE COMPLETE PO LIQD: 237.0000 mL | Freq: Two times a day (BID) | ORAL | Status: AC

## 2014-08-31 MED ORDER — CETYLPYRIDINIUM CHLORIDE 0.05 % MT LIQD: 7.0000 mL | Freq: Two times a day (BID) | OROMUCOSAL | Status: DC

## 2014-08-31 MED ORDER — TUBERCULIN PPD 5 UNIT/0.1ML ID SOLN
5.0000 [IU] | Freq: Once | INTRADERMAL | Status: DC
  Administered 2014-08-31: 5 [IU] via INTRADERMAL
  Filled 2014-08-31: qty 0.1

## 2014-08-31 MED ORDER — LORAZEPAM 0.5 MG PO TABS: 0.5000 mg | ORAL_TABLET | Freq: Three times a day (TID) | ORAL | Status: AC

## 2014-08-31 MED ORDER — OXYCODONE-ACETAMINOPHEN 5-325 MG PO TABS: 1.0000 | ORAL_TABLET | Freq: Two times a day (BID) | ORAL | Status: AC | PRN

## 2014-08-31 NOTE — Progress Notes (Addendum)
MD Gwenlyn PerkingMadera at bedside. Notified MD that of patient's high blood pressure. MD verbal order to recheck after 8AM BP meds. Will continue to monitor.  915 AM MD Madera aware that patient's recheck BP is 161/75. No further orders at this time

## 2014-08-31 NOTE — Plan of Care (Signed)
Problem: Consults Goal: General Medical Patient Education See Patient Education Module for specific education.  Outcome: Progressing Goal: Diabetes Guidelines if Diabetic/Glucose > 140 If diabetic or lab glucose is > 140 mg/dl - Initiate Diabetes/Hyperglycemia Guidelines & Document Interventions  Outcome: Not Applicable Date Met:  25/95/63  Problem: Phase I Progression Outcomes Goal: OOB as tolerated unless otherwise ordered Outcome: Progressing Goal: Voiding-avoid urinary catheter unless indicated Outcome: Not Applicable Date Met:  87/56/43  Problem: Phase II Progression Outcomes Goal: IV changed to normal saline lock Outcome: Completed/Met Date Met:  08/31/14 Goal: Obtain order to discontinue catheter if appropriate Outcome: Not Applicable Date Met:  32/95/18  Problem: Phase III Progression Outcomes Goal: Pain controlled on oral analgesia Outcome: Completed/Met Date Met:  08/31/14 Goal: IV/normal saline lock discontinued Outcome: Completed/Met Date Met:  08/31/14 Goal: Foley discontinued Outcome: Not Applicable Date Met:  84/16/60  Problem: Discharge Progression Outcomes Goal: Pain controlled with appropriate interventions Outcome: Completed/Met Date Met:  08/31/14

## 2014-08-31 NOTE — Clinical Social Work Note (Signed)
Per daughter's request CSW has faxed FL2 and [facility]s [facility] [address] for the ALFs review. Per daughter the facility is prepared to take the patient today. CSW explained that if the facility does not admit the patient today, she will need to followup with the facility from home as patient has been discharged.   Roddie McBryant Shai Rasmussen MSW, [place]CS[place], [place], [phone]

## 2014-08-31 NOTE — Clinical Social Work Psychosocial (Signed)
Clinical Social Work Department BRIEF PSYCHOSOCIAL ASSESSMENT 08/31/2014  Patient:  Lorenda HatchetIX,Nicolemarie M     Account Number:  [id number]     Admit date:  08/29/2014  Clinical Social Worker:  Lavell LusterAMPBELL,Johnie Stadel BRYANT, LCSWA  Date/Time:  08/31/2014 12:30 PM  Referred by:  Physician  Date Referred:  08/31/2014 Referred for  ALF Placement   Other Referral:   NA   Interview type:  Family Other interview type:   Patient confused. Daughter Celine MansSonja interviewed.    PSYCHOSOCIAL DATA Living Status:  FAMILY Admitted from facility:   Level of care:   Primary support name:  Sonja Primary support relationship to patient:  CHILD, ADULT Degree of support available:   Support is good.    CURRENT CONCERNS Current Concerns  Post-Acute Placement   Other Concerns:   NA    SOCIAL WORK ASSESSMENT / PLAN CSW was notified by MD and RNCM that the patient's daughter is wanting patient placed in ALF from hospital. Original plan was for patient to DC home with daughter. CSW explained to the daughter that if patient cannot be accepted by ALF today the she will need to seek placement at ALF from home. Daughter Celine MansSonja states that she has been working on getting the patient [facility] and the facility has told her that they just need an FL2 and DC Summary. Patient has been living at home with daughter but daughter is clearly overwhelmed by the patient's constant need for supervision. CSW will assist.   Assessment/plan status:  Psychosocial Support/Ongoing Assessment of Needs Other assessment/ plan:   Complete Fl2, Fax, PASRR   Information/referral to community resources:   CSW contact information given.    PATIENT'S/FAMILY'S RESPONSE TO PLAN OF CARE: Patient's daughter agreeable to patient discharging to ALF or home if placement cannot be made by time of discharge (today). CSW will assist.   Roddie McBryant Retal Tonkinson MSW, [place]CSWA, [place], [phone]

## 2014-08-31 NOTE — Progress Notes (Addendum)
TB skin test administered in left posterier forearm by [employer] [facility] student Lavonia DanaKristina Abbett with supervision of nursing instructor Hortencia ConradiWendi Gwaltney at 2:04PM. Skin marked. Patient tolerate well.

## 2014-08-31 NOTE — Care Management Note (Addendum)
    Page 1 of 1   08/31/2014     3:40:02 PM CARE MANAGEMENT NOTE 08/31/2014  Patient:  [name],[name]   Account Number:  [id number]  Date Initiated:  08/31/2014  Documentation initiated by:  Letha CapeAYLOR,Thurlow Gallaga  Subjective/Objective Assessment:   dx fall, dementia  admit- patient has 24hr care with [facility].  Daughter [name] phone [phone].     Action/Plan:   pt eval- rec hhpt   Anticipated DC Date:  08/31/2014   Anticipated DC Plan:  HOME W HOME HEALTH SERVICES      DC Planning Services  CM consult      [facility]   Choice offered to / List presented to:  C-4 Adult Children        HH arranged  HH-2 PT  HH - 11 Patient Refused      Status of service:  Completed, signed off Medicare Important Message given?  NO (If response is "NO", the following Medicare IM given date fields will be blank) Date Medicare IM given:   Medicare IM given by:   Date Additional Medicare IM given:   Additional Medicare IM given by:    Discharge Disposition:  ASSISTED LIVING  Per UR Regulation:  Reviewed for med. necessity/level of care/duration of stay  If discussed at Long Length of Stay Meetings, dates discussed:    Comments:  08/31/14 1116 Letha Capeeborah Layia Walla RN, BSN [phone] patient is for dc today, NCM spoke with Desiree LaundrySonya the daughter and she states she does not want hhpt at this time and that she is not sure she want to take patient home from hospital, NCM informed her that the other choice would be snf and she would have to pay privately and she states she knows the ramifications of the financial aspects.  NCM informed daughter to let NCM know if she changes her mind about receiving the hhpt.  Daughter Desiree LaundrySonya is working on getting patient into ALF today, CSW faxed fl2 to facility. Per CSW the facility states they will take patient today to ALF.

## 2014-08-31 NOTE — Clinical Social Work Note (Addendum)
Clinical Social Work Department CLINICAL SOCIAL WORK PLACEMENT NOTE 08/31/2014  Patient:  [name],[name]  Account Number:  [id number] Admit date:  08/29/2014  Clinical Social Worker:  Lavell LusterJOSEPH BRYANT Shawanda Sievert, LCSWA  Date/time:  08/31/2014 07:16 PM  Clinical Social Work is seeking post-discharge placement for this patient at the following level of care:   ASSISTED LIVING/REST HOME   (*CSW will update this form in Epic as items are completed)   08/31/2014  Patient/family provided with [facility] of Clinical Social Work's list of facilities offering this level of care within the geographic area requested by the patient (or if unable, by the patient's family).  08/31/2014  Patient/family informed of their freedom to choose among providers that offer the needed level of care, that participate in Medicare, Medicaid or managed care program needed by the patient, have an available bed and are willing to accept the patient.  08/31/2014  Patient/family informed of [facility]' ownership interest [facility], as well as of the fact that they are under no obligation to receive care at this facility.  PASARR submitted to EDS on 08/31/2014 PASARR number received on 08/31/2014  FL2 transmitted to all facilities in geographic area requested by pt/family on  08/31/2014 FL2 transmitted to all facilities within larger geographic area on   Patient informed that his/her managed care company has contracts with or will negotiate with  certain facilities, including the following:     Patient/family informed of bed offers received:  08/31/2014 Patient chooses bed at Other Physician recommends and patient chooses bed at    Patient to be transferred to Other on  08/31/2014 Patient to be transferred to facility by Ambulance Patient and family notified of transfer on 08/31/2014 Name of family member notified:  Celine MansSonja  The following physician request were entered in  Epic:   Additional Comments: Daughter wants patient transported to facility by EMS. CSW informed daughter that she will likely be billed as patient does not require EMS transport. CSW has left packet on chart along with numbers for transport for RN to call once family has arrived at patient's room. Facility prepared for the patient to come to their facility. CSW signing off at this time.    Roddie McBryant Laurissa Cowper MSW, [place]CSWA, [place], [phone]

## 2014-08-31 NOTE — Clinical Social Work Note (Signed)
CSW attempted to assess patient. Patient very confused. No family at bedside. CSW left message on daughter's phone explaining that ALF list has been left in the room for her review. CSW asked that daughter contact CSW back if she would like to meet in person or speak by phone to discuss her interest in memory care placement. CSW will wait for call back.  Roddie McBryant Arath Kaigler MSW, [place]CSWA, [place], [phone]

## 2014-08-31 NOTE — Progress Notes (Signed)
[facility] arrive to take pt to facility. Dayshift nurse speak to facility. Daughter at bedside. Pt in no s/s of distress.

## 2014-08-31 NOTE — Clinical Social Work Note (Signed)
CSW received voicemail from daughter. CSW called daughter back, but daughter did not answer. Messsage was left.   Roddie McBryant Glendel Jaggers MSW, [place]CSWA, [place], [phone]

## 2014-08-31 NOTE — Discharge Summary (Signed)
Physician Discharge Summary  [name] JYN:[id number]:[id number] DOB: [birth year] DOA: 08/29/2014  PCP: Charlette CaffeyParuchuri, Desiree P, MD  Admit date: 08/29/2014 Discharge date: 08/31/2014  Time spent: 45 minutes  Recommendations for Outpatient Follow-up:  1. Follow up with Orthopaedic, [name], for reassessment of right wrist and instructions on removal of Velcro splint. 2. Follow up PCP for management of HTN, hypothyroidism, anemia, depression, and anxiety. 3. Discharge home with home health PT  Discharge Diagnoses:  Principal Problem:   Acute encephalopathy Active Problems:   Hypothyroidism   Osteoporosis   Hypertension   Anemia   Paroxysmal a-fib   Fever   Protein-calorie malnutrition, severe   Fall   Discharge Condition: stable  Diet recommendation: Regular diet  Filed Weights   08/29/14 1700 08/30/14 0453 08/31/14 0459  Weight: 47.9 kg (105 lb 9.6 oz) 47.5 kg (104 lb 11.5 oz) 46.8 kg (103 lb 2.8 oz)    History of present illness:  80 y.o. female with Past medical history of Dementia, hypertension, UTI, hypothyroidism, vision loss . The patient presented with fever and confusion. The history was obtained from patient's daughter as the patient was unable to provide any history due to her dementia and confusion. Patient apparently was trying to get out of the bed on Thursday and had a fall but did not hit her head or neck. Since then she has been having progressively worsening confusion and was more agitated. There is no change in her medication and she has not been having any nausea, vomiting or diarrhea or complaining of burning when she was urinating. The patient was unable to identify her daughter and since last 2 days has not been eating and drinking enough.  Hospital Course:  Acute encephalopathy (presumed toxic, from fever and right forearm cellulitis) -Patient with baseline dementia presented with increased confusion, and one time increase in temperature. Patient  remained afebrile during hospitalization without leukocytosis, and only source of infection was right forearm cellulitis.   -Workup with  CT head/Cspine and X rays of chest/pelvis were all with no acute abnormalities. -A UA and TSH levels were normal, and blood and urine cultures did not show any growth to date.  -Was placed on IV Zosyn and Vancomycin for one day, which was subsequently discontinued.   -Pt was placed on Doxycyline for a narrower coverage and planning to treat for a total of 10 days. -Physical therapy recommended home health PT; patient with 24/7 caregiver. Daughter is looking into memory unit after discharge  Right wrist swelling -Presented with right wrist swelling, with Xray showeing soft tissue swelling without fracture or dislocation.  Was evaluated by Orthopaedic with recommendation of imobilizing the wrist and outpatient follow up.  Right wrist was intinally immobiized with short arm cast, however, due to loosiness of the fitting of the cast and pt attempts at removing the cast, the short arm cast was removed and a velcro splint was used instead.   -Follow up with Dr. Dominica SeverinWilliam Stevens as soon as possible for reassessment of right wrist, and instruction of for removal of Velcro.    Hypokalemia -Resolved on discharge.  Potassium of 2.9 on admission, repleted with oral KDUR 40 meq x3 doses.  -Pt with normal magnesium levels and normal K levels at discharge.    Microcytic Anemia -Hgb 9.5 with no signs or hx of overt bleeding.  An anemia panel showed low iron levels.   -Continue iron tablets 325mg  PO BID.     Hypothyroidism -Normal TSH and free T4  -Continue  Synthroid 50 mcg daily  Essential HTN -BP stable/slightly elevated  -will increase Coreg to 6.25mg  BID, Losartan 50mg  daily   Dementia with behavioral problems (Depression and Anxiety) -Stable on discharge without SI or hallucinations.   -Continue Seroquel 12.5 QHS -Continue Ativan 0.5mg  BID PRN -patient with initial  agitation requiring haldol X 1; now resolved (most likely sundowning and changes in environment as reason for agitation)  protein calorie malnutrition Severe -continue feeding supplements  Tuberculin test placed -as per daughter petition (needs to be read 48-72 hours) -no abnormalities on CXR and no ongoing TB symptoms prior to admission  Procedures:  None  Consultations:  None  Discharge Exam: Filed Vitals:   08/31/14 0900  BP: 161/75  Pulse: 96  Temp:   Resp: 18     Exam General: elderly, frail looking Caucasian female, in NAD and no fever Eyes: Anicteric account.  Cardiovascular: Regular rate and rhythm.  No murmurs, rubs, or gallops. Respiratory: Clear to auscultate bilaterally.  No rhonchi or crepitations. Abdomen: Soft nontender bowel sounds present. No guarding or rigidity.  Musculoskeletal: right wrist in Velcro splint.  No cyanosis or edema.  Skin: mild erythema on her right wrist/forearm (present prior to cast or velcro splint application); resolving and without any drainage Psychiatric: Unable to assess due to dementia   Discharge Instructions    Medication List    STOP taking these medications        amoxicillin-clavulanate 875-125 MG per tablet  Commonly known as:  AUGMENTIN      TAKE these medications        albuterol (2.5 MG/3ML) 0.083% nebulizer solution  Commonly known as:  PROVENTIL  Take 3 mLs (2.5 mg total) by nebulization every 2 (two) hours as needed for wheezing.     carvedilol 6.25 MG tablet  Commonly known as:  COREG  Take 1 tablet (6.25 mg total) by mouth 2 (two) times daily with a meal.     cholecalciferol 1000 UNITS tablet  Commonly known as:  VITAMIN D  Take 3,000 Units by mouth daily.     doxycycline 50 MG capsule  Commonly known as:  VIBRAMYCIN  Take 2 capsules (100 mg total) by mouth 2 (two) times daily.     feeding supplement (ENSURE COMPLETE) Liqd  Take 237 mLs by mouth 2 (two) times daily between meals.      ferrous sulfate 325 (65 FE) MG tablet  Take 1 tablet (325 mg total) by mouth 2 (two) times daily with a meal.     levothyroxine 50 MCG tablet  Commonly known as:  SYNTHROID, LEVOTHROID  Take 50 mcg by mouth every morning.     LORazepam 0.5 MG tablet  Commonly known as:  ATIVAN  Take 1 tablet (0.5 mg total) by mouth 3 (three) times daily. Take one tablet by mouth twice daily as needed for agitation     losartan 50 MG tablet  Commonly known as:  COZAAR  Take 50 mg by mouth every morning.     oxyCODONE-acetaminophen 5-325 MG per tablet  Commonly known as:  PERCOCET/ROXICET  Take 1 tablet by mouth every 12 (twelve) hours as needed for severe pain.     polyethylene glycol packet  Commonly known as:  MIRALAX / GLYCOLAX  Take 17 g by mouth daily.     QUEtiapine 25 MG tablet  Commonly known as:  SEROQUEL  Take 0.5 tablets (12.5 mg total) by mouth at bedtime.     traZODone 100 MG tablet  Commonly known  as:  DESYREL  Take 100 mg by mouth at bedtime as needed for sleep.       Allergies  Allergen Reactions  . Aspirin Other (See Comments)    Nose bleeds.   . Tramadol     Per daughter "makes her crazy"      The results of significant diagnostics from this hospitalization (including imaging, microbiology, ancillary and laboratory) are listed below for reference.    Significant Diagnostic Studies: Dg Pelvis 1-2 Views  08/29/2014   CLINICAL DATA:  Recent fall with hip pain, initial encounter  EXAM: PELVIS - 1-2 VIEW  COMPARISON:  None.  FINDINGS: Postsurgical changes are noted in the proximal right femur. Chronic changes are noted in the pubic rami bilaterally. No acute fracture is identified. The known sacral fractures are not well appreciated on this exam. No soft tissue abnormality is seen.  IMPRESSION: Chronic changes without acute abnormality.   Electronically Signed   By: Alcide Clever M.D.   On: 08/29/2014 17:54   Dg Wrist Complete Right  08/29/2014   CLINICAL DATA:  RIGHT  wrist pain and deformity post fall  EXAM: RIGHT WRIST - COMPLETE 3+ VIEW  COMPARISON:  None  FINDINGS: Diffuse osseous demineralization.  Ulnar plus variance with sclerosis of the portion of the lunate adjacent to the distal ulna suggesting ulnolunatal impaction.  Deformity of the distal radius with volar tilt of the distal radial articular surface, though no definite acute fracture plane is identified, question prior/remote distal radial metaphyseal fracture with resultant deformity.  Degenerative changes at distal radioulnar joint.  Diffuse soft tissue swelling at wrist.  Small amount of amorphous soft tissue calcification adjacent to distal ulna, nonspecific.  Chondrocalcinosis at TFCC.  No definite acute fracture, dislocation or bone destruction.  IMPRESSION: Deformity of distal radius with volar tilt of distal radial articular surface but without acute fracture plane evident, question sequela of remote distal radial metaphyseal fracture.  Secondary ulnar plus variance with ulnolunatal impaction and degenerative changes of the distal radioulnar joint.  Osseous demineralization and chondrocalcinosis question CPPD.  Soft tissue swelling without definite acute fracture or dislocation.   Electronically Signed   By: Ulyses Southward M.D.   On: 08/29/2014 17:33   Ct Head Wo Contrast  08/29/2014   CLINICAL DATA:  Confusion, 6 days duration. Questionable history of previous fall.  EXAM: CT HEAD WITHOUT CONTRAST  CT CERVICAL SPINE WITHOUT CONTRAST  TECHNIQUE: Multidetector CT imaging of the head and cervical spine was performed following the standard protocol without intravenous contrast. Multiplanar CT image reconstructions of the cervical spine were also generated.  COMPARISON:  06/30/2014  FINDINGS: CT HEAD FINDINGS  The brain shows generalized atrophy. There is chronic small vessel change throughout the cerebral hemispheric white matter. There is old focal infarction in the right basal ganglia/ external capsule  region. No cortical or large vessel territory infarction. No mass lesion, hemorrhage, hydrocephalus or extra-axial collection. Sinuses are clear. No calvarial abnormality. There is atherosclerotic calcification of the major vessels at the base of the brain.  CT CERVICAL SPINE FINDINGS  There is moderate motion degradation. No traumatic malalignment. No fracture. No soft tissue swelling. There is ordinary mild degenerative spondylosis at C2-3, C3-4, C4-5, C5-6 and C6-7. No significant stenosis of the canal or foramina.  IMPRESSION:  Cervical spine CT: Chronic degenerative changes. No acute or traumatic finding.  Head CT: No acute finding. Generalized brain atrophy and extensive chronic small vessel ischemic changes.   Electronically Signed   By: Loraine Leriche  Shogry M.D.   On: 08/29/2014 16:57   Ct Cervical Spine Wo Contrast  08/29/2014   CLINICAL DATA:  Confusion, 6 days duration. Questionable history of previous fall.  EXAM: CT HEAD WITHOUT CONTRAST  CT CERVICAL SPINE WITHOUT CONTRAST  TECHNIQUE: Multidetector CT imaging of the head and cervical spine was performed following the standard protocol without intravenous contrast. Multiplanar CT image reconstructions of the cervical spine were also generated.  COMPARISON:  06/30/2014  FINDINGS: CT HEAD FINDINGS  The brain shows generalized atrophy. There is chronic small vessel change throughout the cerebral hemispheric white matter. There is old focal infarction in the right basal ganglia/ external capsule region. No cortical or large vessel territory infarction. No mass lesion, hemorrhage, hydrocephalus or extra-axial collection. Sinuses are clear. No calvarial abnormality. There is atherosclerotic calcification of the major vessels at the base of the brain.  CT CERVICAL SPINE FINDINGS  There is moderate motion degradation. No traumatic malalignment. No fracture. No soft tissue swelling. There is ordinary mild degenerative spondylosis at C2-3, C3-4, C4-5, C5-6 and C6-7.  No significant stenosis of the canal or foramina.  IMPRESSION:  Cervical spine CT: Chronic degenerative changes. No acute or traumatic finding.  Head CT: No acute finding. Generalized brain atrophy and extensive chronic small vessel ischemic changes.   Electronically Signed   By: Paulina FusiMark  Shogry M.D.   On: 08/29/2014 16:57   Dg Chest Port 1 View  08/29/2014   CLINICAL DATA:  Productive cough, fever and shortness of Breath.  EXAM: PORTABLE CHEST - 1 VIEW  COMPARISON:  06/30/2014.  FINDINGS: The heart is upper limits of normal in size and stable given the AP portable technique and supine position of the patient. The mediastinal contours are prominent. This is partly due to a tortuous ectatic aorta and the rotation the patient along with the AP projection and supine position. The lungs are clear of acute process. No definite pleural effusions or pneumothorax. Bilateral healed rib fractures are noted. Multilevel vertebral augmentation changes are noted.  IMPRESSION: Cardiac enlargement and prominent mediastinal contours likely due to the rotation of the patient and the AP projection and supine position.  No definite acute pulmonary findings.  Multiple healed rib fractures.   Electronically Signed   By: Loralie ChampagneMark  Gallerani M.D.   On: 08/29/2014 16:01    Microbiology: Recent Results (from the past 240 hour(s))  Culture, blood (routine x 2)     Status: None (Preliminary result)   Collection Time: 08/29/14  3:38 PM  Result Value Ref Range Status   Specimen Description BLOOD LEFT ARM  Final   Special Requests BOTTLES DRAWN AEROBIC AND ANAEROBIC 5CC  Final   Culture  Setup Time   Final    08/29/2014 23:19 Performed at [facility]    Culture   Final           BLOOD CULTURE RECEIVED NO GROWTH TO DATE CULTURE WILL BE HELD FOR 5 DAYS BEFORE ISSUING A FINAL NEGATIVE REPORT Performed at [facility]    Report Status PENDING  Incomplete  Culture, blood (routine x 2)     Status: None (Preliminary  result)   Collection Time: 08/29/14  3:45 PM  Result Value Ref Range Status   Specimen Description BLOOD LEFT THIGH  Final   Special Requests BOTTLES DRAWN AEROBIC AND ANAEROBIC 5CC  Final   Culture  Setup Time   Final    08/29/2014 23:20 Performed at [facility]    Culture   Final  BLOOD CULTURE RECEIVED NO GROWTH TO DATE CULTURE WILL BE HELD FOR 5 DAYS BEFORE ISSUING A FINAL NEGATIVE REPORT Performed at [facility]    Report Status PENDING  Incomplete  Urine culture     Status: None   Collection Time: 08/29/14  4:10 PM  Result Value Ref Range Status   Specimen Description URINE, CATHETERIZED  Final   Special Requests NONE  Final   Culture  Setup Time   Final    08/29/2014 21:28 Performed at [facility]    Colony Count NO GROWTH Performed at [facility]   Final   Culture NO GROWTH Performed at [facility]   Final   Report Status 08/30/2014 FINAL  Final     Labs: Basic Metabolic Panel:  Recent Labs Lab 08/29/14 1538 08/30/14 0705 08/31/14 0523  NA 138 137 141  K 3.4* 2.9* 4.1  CL 100 101 105  CO2 25 25 24   GLUCOSE 139* 112* 102*  BUN 16 12 18   CREATININE 0.63 0.58 0.51  CALCIUM 9.4 9.2 9.6  MG  --  1.9  --    Liver Function Tests:  Recent Labs Lab 08/29/14 1538 08/30/14 0705  AST 10 10  ALT 5 5  ALKPHOS 108 95  BILITOT 0.4 0.6  PROT 6.5 5.9*  ALBUMIN 3.0* 2.6*   CBC:  Recent Labs Lab 08/29/14 1538 08/30/14 0705 08/31/14 0523  WBC 7.7 8.2 5.6  NEUTROABS 5.6 5.6  --   HGB 10.2* 9.5* 9.0*  HCT 32.7* 30.7* 29.1*  MCV 74.8* 73.6* 72.4*  PLT 341 346 340     Signed: Vassie Loll [phone] [facility] 08/31/2014, 10:13 AM

## 2014-08-31 NOTE — Clinical Social Work Note (Signed)
CSW has sent all requested documentation to [facility] on [address]. CSW has made it clear to daughter that patient is not to remain in the hospital an additional night waiting for ALF placement as the patient has been discharged. CSW has tried to followup with daughter and "Geraldine ContrasDee" (RN with facility) but neither are answering their phones. Messages were left. Message instructing daughter to take DC packet to facility when they accept the patient was left. CSW is signing off at this time.   Roddie McBryant Blondell Laperle MSW, KittrellLCSWA, [facility]AS[facility], [phone]

## 2014-09-04 LAB — CULTURE, BLOOD (ROUTINE X 2)
CULTURE: NO GROWTH
Culture: NO GROWTH

## 2015-04-13 DEATH — deceased

## 2016-05-24 IMAGING — CT CT HIP*R* W/O CM
2 of 4 series · 7 of 46 positions shown, 8 images · non-contrast
Comparison: CT 06/10/2014

CLINICAL DATA: Hip pain.

EXAM:
CT OF THE RIGHT HIP WITHOUT CONTRAST
TECHNIQUE: Multidetector CT imaging was performed according to the standard
protocol. Multiplanar CT image reconstructions were also generated.

[Series 302: soft tissue · axial · 0.30mm/px · z∈[-76,+60]mm · 4 of 92 slices shown, 5 images]
[im 12/92  soft-tissue]
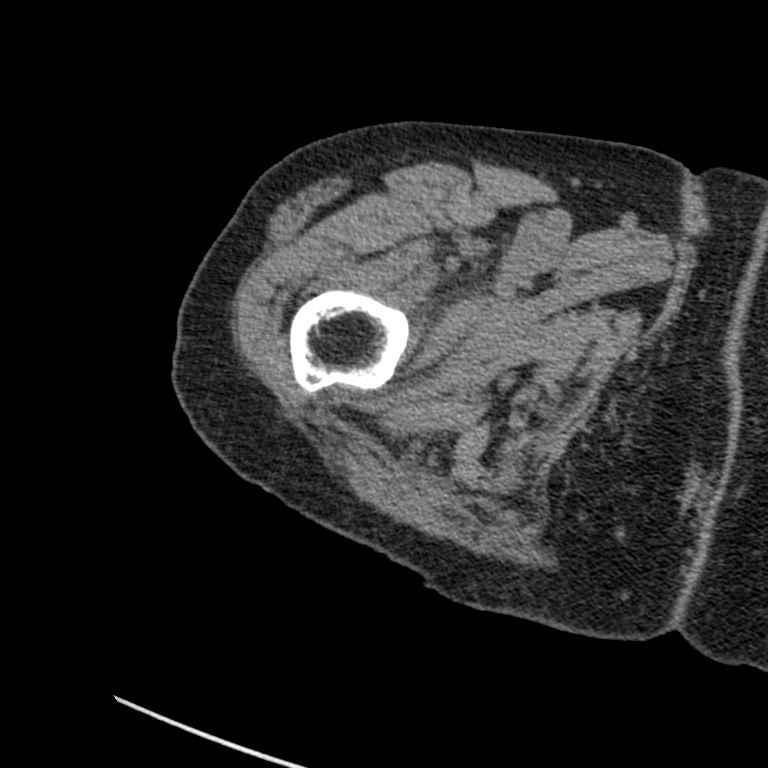
[im 12/92  bone]
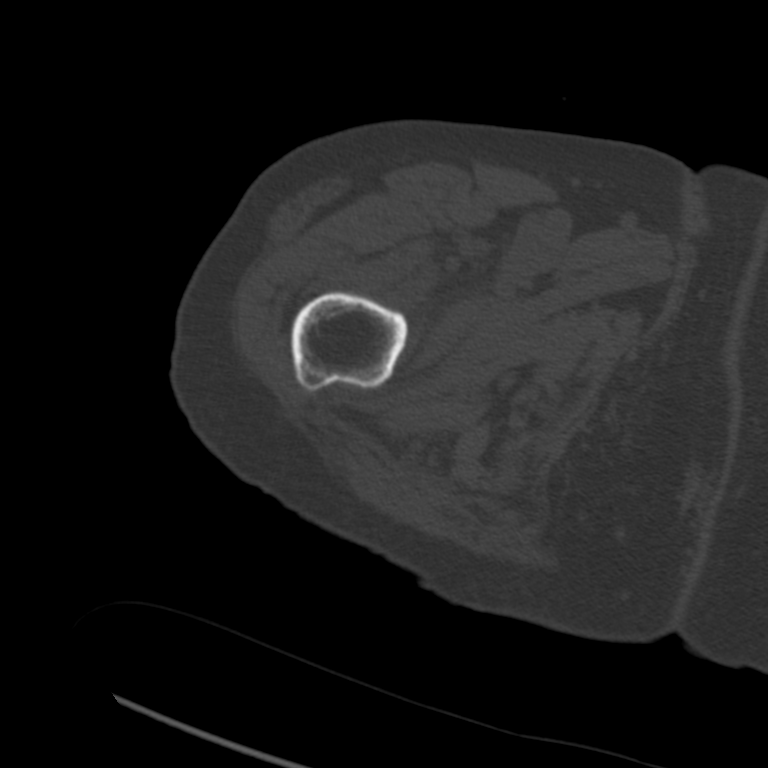
[im 36/92  soft-tissue]
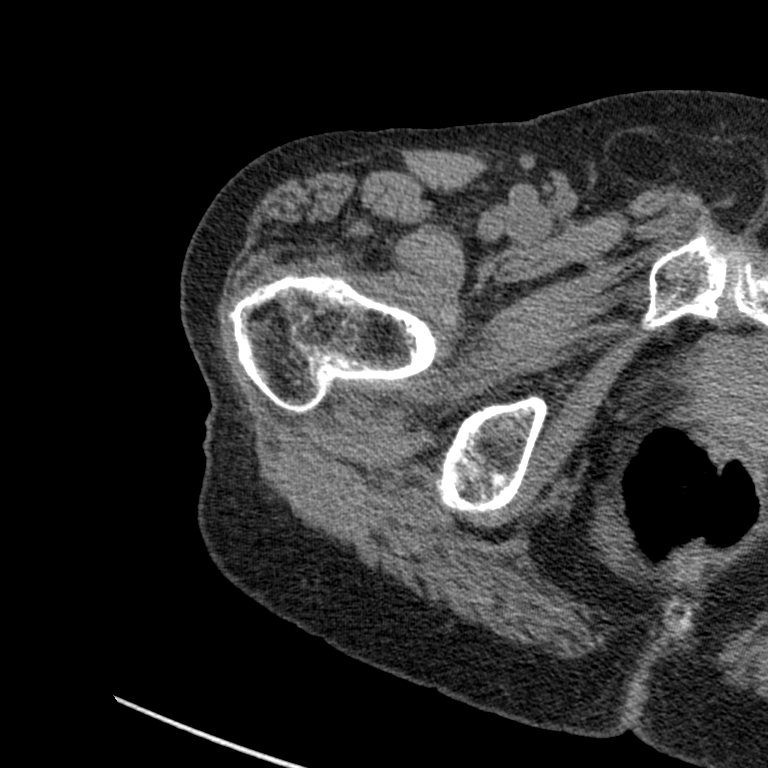
[im 56/92  soft-tissue]
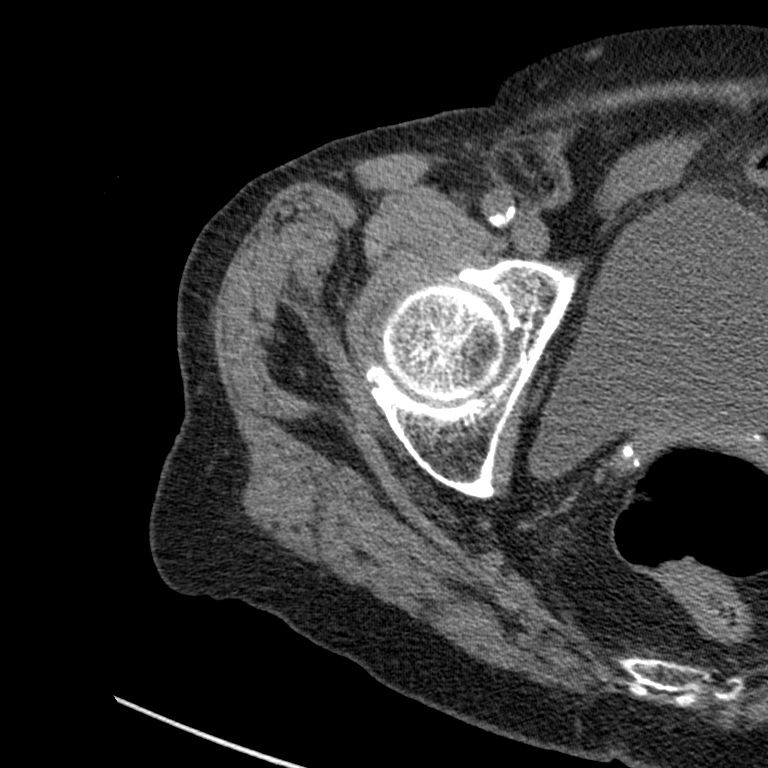
[im 80/92  soft-tissue]
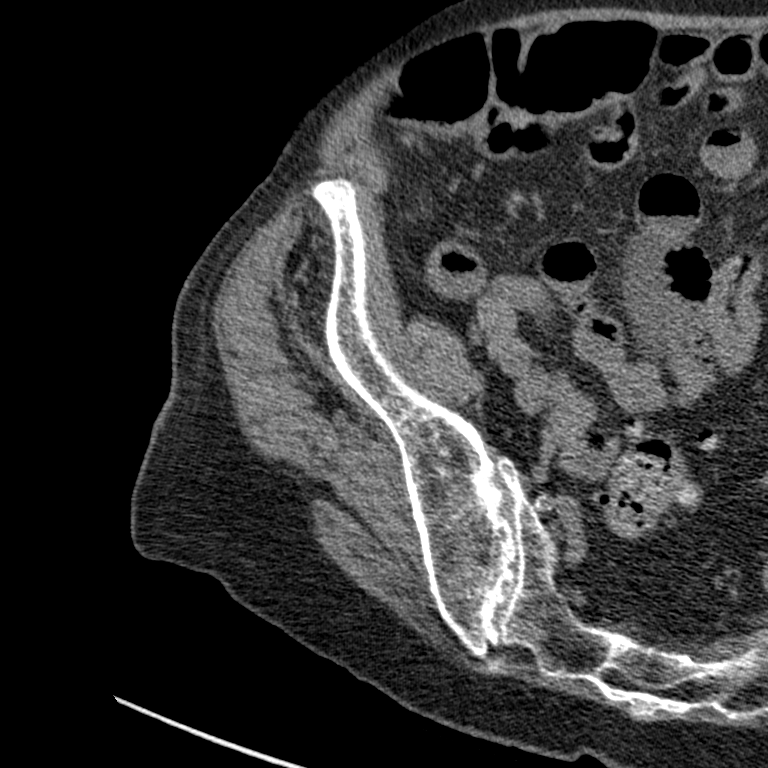

[Series 307: cor st · coronal · 0.31mm/px · 3 of 79 slices shown]
[im 27/79  soft-tissue]
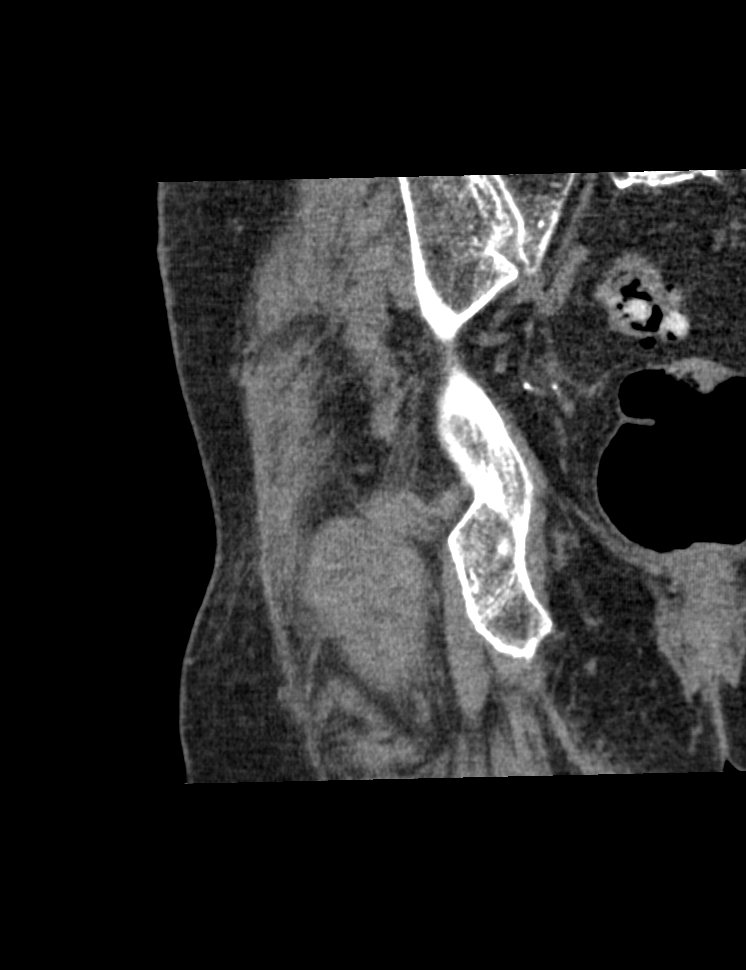
[im 35/79  soft-tissue]
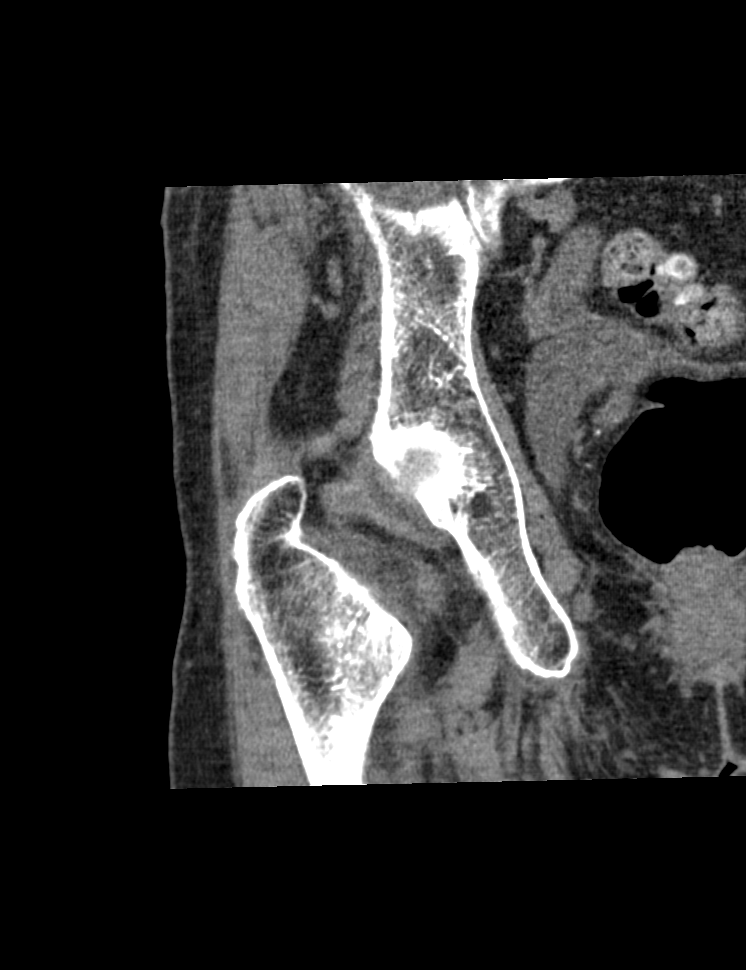
[im 44/79  soft-tissue]
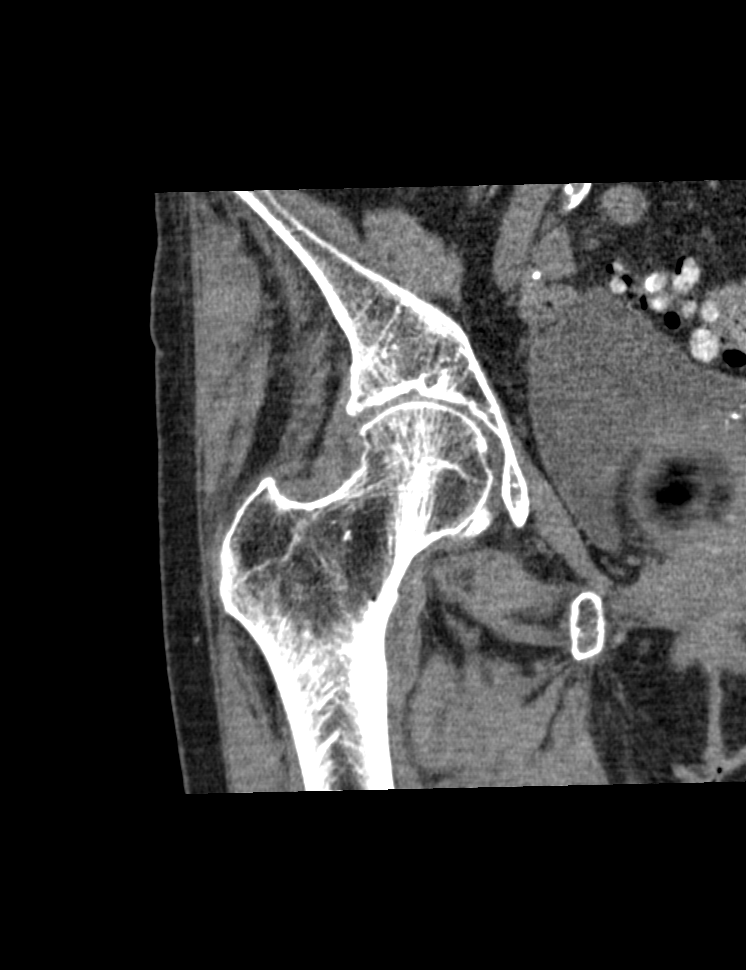

[7 of 46 positions shown; findings below may reference images not displayed]

FINDINGS: Diffuse osteopenia and degenerative change present. An extremely
subtle lucency is noted along the medial aspect of the base of the
right femoral neck . Punctate area air is noted within the medullary
bone adjacent to this region. These changes may be related to an
extremely subtle fracture of the base of the right femoral neck. No
displaced fractures noted. Sacrum and SI joints are intact were
visualized. Sigmoid colonic diverticulosis. Right inguinal hernia
with herniation of fat only.Peripheral vascular disease.
IMPRESSION: 1. Extremely subtle lucency is noted along the medial aspect of the
base of the right femoral neck. Adjacent punctate area of air is
noted in the medullary bone. These changes may be related to an
extremely subtle fracture of the base of the right femoral neck. If
further evaluation is needed MRI can be obtained.
2. Diffuse severe osteopenia and degenerative change.
